# Patient Record
Sex: Male | Born: 1950 | Race: Black or African American | Hispanic: No | Smoking: Current every day smoker
Health system: Southern US, Community
[De-identification: ages and names within clinical notes are randomized; demographics above are authoritative.]

## PROBLEM LIST (undated history)

## (undated) DIAGNOSIS — A4902 Methicillin resistant Staphylococcus aureus infection, unspecified site: Secondary | ICD-10-CM

## (undated) DIAGNOSIS — N400 Enlarged prostate without lower urinary tract symptoms: Secondary | ICD-10-CM

## (undated) DIAGNOSIS — M6281 Muscle weakness (generalized): Secondary | ICD-10-CM

## (undated) DIAGNOSIS — N200 Calculus of kidney: Secondary | ICD-10-CM

## (undated) DIAGNOSIS — R41841 Cognitive communication deficit: Secondary | ICD-10-CM

## (undated) DIAGNOSIS — I10 Essential (primary) hypertension: Secondary | ICD-10-CM

## (undated) DIAGNOSIS — J9600 Acute respiratory failure, unspecified whether with hypoxia or hypercapnia: Secondary | ICD-10-CM

## (undated) DIAGNOSIS — K264 Chronic or unspecified duodenal ulcer with hemorrhage: Secondary | ICD-10-CM

## (undated) HISTORY — PX: TOTAL KNEE ARTHROPLASTY: SHX125

## (undated) HISTORY — PX: NECK SURGERY: SHX720

## (undated) HISTORY — PX: APPENDECTOMY: SHX54

## (undated) HISTORY — PX: WRIST SURGERY: SHX841

## (undated) HISTORY — PX: TIBIA FRACTURE SURGERY: SHX806

## (undated) HISTORY — PX: KNEE ARTHROSCOPY: SUR90

## (undated) HISTORY — DX: Essential (primary) hypertension: I10

---

## 2003-11-04 ENCOUNTER — Emergency Department (HOSPITAL_COMMUNITY): Admission: EM | Admit: 2003-11-04 | Discharge: 2003-11-04 | Payer: Self-pay | Admitting: Emergency Medicine

## 2006-07-16 ENCOUNTER — Emergency Department (HOSPITAL_COMMUNITY): Admission: EM | Admit: 2006-07-16 | Discharge: 2006-07-16 | Payer: Self-pay | Admitting: Emergency Medicine

## 2006-07-19 ENCOUNTER — Emergency Department (HOSPITAL_COMMUNITY): Admission: EM | Admit: 2006-07-19 | Discharge: 2006-07-19 | Payer: Self-pay | Admitting: Emergency Medicine

## 2006-08-29 ENCOUNTER — Emergency Department (HOSPITAL_COMMUNITY): Admission: EM | Admit: 2006-08-29 | Discharge: 2006-08-29 | Payer: Self-pay | Admitting: Emergency Medicine

## 2006-09-10 ENCOUNTER — Ambulatory Visit (HOSPITAL_COMMUNITY): Admission: RE | Admit: 2006-09-10 | Discharge: 2006-09-10 | Payer: Self-pay | Admitting: Orthopedic Surgery

## 2006-09-20 ENCOUNTER — Encounter: Payer: Self-pay | Admitting: Vascular Surgery

## 2006-09-20 ENCOUNTER — Ambulatory Visit: Payer: Self-pay | Admitting: Vascular Surgery

## 2006-09-20 ENCOUNTER — Ambulatory Visit (HOSPITAL_COMMUNITY): Admission: RE | Admit: 2006-09-20 | Discharge: 2006-09-20 | Payer: Self-pay | Admitting: Orthopedic Surgery

## 2006-10-27 ENCOUNTER — Encounter: Admission: RE | Admit: 2006-10-27 | Discharge: 2006-12-20 | Payer: Self-pay | Admitting: Orthopedic Surgery

## 2006-12-15 ENCOUNTER — Ambulatory Visit: Payer: Self-pay | Admitting: Internal Medicine

## 2006-12-15 LAB — CONVERTED CEMR LAB
ALT: 51 units/L — ABNORMAL HIGH (ref 0–40)
AST: 49 units/L — ABNORMAL HIGH (ref 0–37)
Albumin: 4.2 g/dL (ref 3.5–5.2)
Alkaline Phosphatase: 86 units/L (ref 39–117)
BUN: 11 mg/dL (ref 6–23)
Basophils Absolute: 0.1 10*3/uL (ref 0.0–0.1)
Basophils Relative: 2.4 % — ABNORMAL HIGH (ref 0.0–1.0)
Bilirubin, Direct: 0.1 mg/dL (ref 0.0–0.3)
CO2: 32 meq/L (ref 19–32)
Calcium: 9.3 mg/dL (ref 8.4–10.5)
Chloride: 111 meq/L (ref 96–112)
Creatinine, Ser: 1 mg/dL (ref 0.4–1.5)
Eosinophils Absolute: 0.1 10*3/uL (ref 0.0–0.6)
Eosinophils Relative: 1.7 % (ref 0.0–5.0)
GFR calc Af Amer: 101 mL/min
GFR calc non Af Amer: 83 mL/min
Glucose, Bld: 68 mg/dL — ABNORMAL LOW (ref 70–99)
HCT: 44.9 % (ref 39.0–52.0)
Hemoglobin: 15.6 g/dL (ref 13.0–17.0)
Lymphocytes Relative: 44.2 % (ref 12.0–46.0)
MCHC: 34.7 g/dL (ref 30.0–36.0)
MCV: 97.2 fL (ref 78.0–100.0)
Monocytes Absolute: 0.6 10*3/uL (ref 0.2–0.7)
Monocytes Relative: 11.1 % — ABNORMAL HIGH (ref 3.0–11.0)
Neutro Abs: 2.2 10*3/uL (ref 1.4–7.7)
Neutrophils Relative %: 40.6 % — ABNORMAL LOW (ref 43.0–77.0)
Platelets: 284 10*3/uL (ref 150–400)
Potassium: 4.3 meq/L (ref 3.5–5.1)
RBC: 4.61 M/uL (ref 4.22–5.81)
RDW: 12 % (ref 11.5–14.6)
Sodium: 143 meq/L (ref 135–145)
TSH: 1.81 microintl units/mL (ref 0.35–5.50)
Total Bilirubin: 0.8 mg/dL (ref 0.3–1.2)
Total Protein: 7.6 g/dL (ref 6.0–8.3)
WBC: 5.5 10*3/uL (ref 4.5–10.5)

## 2006-12-19 DIAGNOSIS — M1990 Unspecified osteoarthritis, unspecified site: Secondary | ICD-10-CM | POA: Insufficient documentation

## 2006-12-19 DIAGNOSIS — G35 Multiple sclerosis: Secondary | ICD-10-CM

## 2006-12-23 ENCOUNTER — Ambulatory Visit: Payer: Self-pay

## 2007-01-02 ENCOUNTER — Inpatient Hospital Stay (HOSPITAL_COMMUNITY): Admission: RE | Admit: 2007-01-02 | Discharge: 2007-01-06 | Payer: Self-pay | Admitting: Orthopedic Surgery

## 2007-01-05 ENCOUNTER — Ambulatory Visit: Payer: Self-pay | Admitting: Physical Medicine & Rehabilitation

## 2007-01-24 ENCOUNTER — Emergency Department (HOSPITAL_COMMUNITY): Admission: EM | Admit: 2007-01-24 | Discharge: 2007-01-24 | Payer: Self-pay | Admitting: Emergency Medicine

## 2007-01-25 ENCOUNTER — Encounter: Admission: RE | Admit: 2007-01-25 | Discharge: 2007-04-25 | Payer: Self-pay | Admitting: Orthopedic Surgery

## 2007-01-25 ENCOUNTER — Emergency Department (HOSPITAL_COMMUNITY): Admission: EM | Admit: 2007-01-25 | Discharge: 2007-01-25 | Payer: Self-pay | Admitting: Emergency Medicine

## 2007-06-26 ENCOUNTER — Encounter: Admission: RE | Admit: 2007-06-26 | Discharge: 2007-06-27 | Payer: Self-pay | Admitting: *Deleted

## 2007-07-05 ENCOUNTER — Encounter: Admission: RE | Admit: 2007-07-05 | Discharge: 2007-10-03 | Payer: Self-pay | Admitting: *Deleted

## 2007-09-22 ENCOUNTER — Ambulatory Visit (HOSPITAL_COMMUNITY): Admission: RE | Admit: 2007-09-22 | Discharge: 2007-09-22 | Payer: Self-pay | Admitting: *Deleted

## 2007-10-25 ENCOUNTER — Encounter: Admission: RE | Admit: 2007-10-25 | Discharge: 2008-01-02 | Payer: Self-pay | Admitting: *Deleted

## 2008-03-20 ENCOUNTER — Encounter: Admission: RE | Admit: 2008-03-20 | Discharge: 2008-06-06 | Payer: Self-pay | Admitting: Orthopedic Surgery

## 2008-04-29 ENCOUNTER — Ambulatory Visit (HOSPITAL_COMMUNITY): Admission: RE | Admit: 2008-04-29 | Discharge: 2008-04-29 | Payer: Self-pay | Admitting: Orthopedic Surgery

## 2008-06-14 ENCOUNTER — Encounter: Admission: RE | Admit: 2008-06-14 | Discharge: 2008-06-14 | Payer: Self-pay | Admitting: Neurology

## 2008-08-05 ENCOUNTER — Encounter: Admission: RE | Admit: 2008-08-05 | Discharge: 2008-10-07 | Payer: Self-pay | Admitting: Neurology

## 2009-02-06 ENCOUNTER — Encounter: Admission: RE | Admit: 2009-02-06 | Discharge: 2009-03-12 | Payer: Self-pay | Admitting: Neurology

## 2009-02-17 ENCOUNTER — Encounter: Admission: RE | Admit: 2009-02-17 | Discharge: 2009-02-17 | Payer: Self-pay | Admitting: Neurology

## 2009-04-11 ENCOUNTER — Inpatient Hospital Stay (HOSPITAL_COMMUNITY): Admission: RE | Admit: 2009-04-11 | Discharge: 2009-04-14 | Payer: Self-pay | Admitting: Neurosurgery

## 2009-07-02 ENCOUNTER — Encounter: Admission: RE | Admit: 2009-07-02 | Discharge: 2009-09-04 | Payer: Self-pay | Admitting: Neurosurgery

## 2009-08-28 ENCOUNTER — Emergency Department (HOSPITAL_COMMUNITY): Admission: EM | Admit: 2009-08-28 | Discharge: 2009-08-28 | Payer: Self-pay | Admitting: Emergency Medicine

## 2009-09-05 ENCOUNTER — Encounter: Payer: Self-pay | Admitting: Orthopaedic Surgery

## 2009-09-05 ENCOUNTER — Inpatient Hospital Stay (HOSPITAL_COMMUNITY): Admission: EM | Admit: 2009-09-05 | Discharge: 2009-09-09 | Payer: Self-pay | Admitting: Emergency Medicine

## 2009-10-20 ENCOUNTER — Encounter: Admission: RE | Admit: 2009-10-20 | Discharge: 2009-12-15 | Payer: Self-pay | Admitting: Orthopaedic Surgery

## 2009-11-29 ENCOUNTER — Emergency Department (HOSPITAL_COMMUNITY): Admission: EM | Admit: 2009-11-29 | Discharge: 2009-11-29 | Payer: Self-pay | Admitting: Emergency Medicine

## 2009-12-08 ENCOUNTER — Emergency Department (HOSPITAL_COMMUNITY): Admission: EM | Admit: 2009-12-08 | Discharge: 2009-12-08 | Payer: Self-pay | Admitting: Emergency Medicine

## 2009-12-16 ENCOUNTER — Inpatient Hospital Stay (HOSPITAL_COMMUNITY): Admission: EM | Admit: 2009-12-16 | Discharge: 2009-12-19 | Payer: Self-pay | Admitting: Emergency Medicine

## 2009-12-28 ENCOUNTER — Emergency Department (HOSPITAL_COMMUNITY): Admission: EM | Admit: 2009-12-28 | Discharge: 2009-12-28 | Payer: Self-pay | Admitting: Emergency Medicine

## 2010-04-11 ENCOUNTER — Emergency Department (HOSPITAL_COMMUNITY): Admission: EM | Admit: 2010-04-11 | Discharge: 2010-04-12 | Payer: Self-pay | Admitting: Emergency Medicine

## 2010-06-16 ENCOUNTER — Ambulatory Visit (HOSPITAL_COMMUNITY)
Admission: RE | Admit: 2010-06-16 | Discharge: 2010-06-17 | Disposition: A | Discharge status: Other Acute Inpatient Hospital | Payer: Self-pay | Source: Home / Self Care | Attending: General Surgery | Admitting: General Surgery

## 2010-06-16 ENCOUNTER — Encounter (INDEPENDENT_AMBULATORY_CARE_PROVIDER_SITE_OTHER): Payer: Self-pay | Admitting: General Surgery

## 2010-06-28 ENCOUNTER — Emergency Department (HOSPITAL_COMMUNITY)
Admission: EM | Admit: 2010-06-28 | Discharge: 2010-06-28 | Payer: Self-pay | Source: Home / Self Care | Admitting: Emergency Medicine

## 2010-07-08 ENCOUNTER — Ambulatory Visit (HOSPITAL_COMMUNITY)
Admission: RE | Admit: 2010-07-08 | Discharge: 2010-07-08 | Payer: Self-pay | Source: Home / Self Care | Attending: Specialist | Admitting: Specialist

## 2010-07-19 ENCOUNTER — Encounter: Payer: Self-pay | Admitting: Orthopedic Surgery

## 2010-07-19 ENCOUNTER — Encounter: Payer: Self-pay | Admitting: Neurology

## 2010-08-31 ENCOUNTER — Emergency Department (HOSPITAL_COMMUNITY)
Admission: EM | Admit: 2010-08-31 | Discharge: 2010-08-31 | Disposition: A | Discharge status: Home / Self Care | Payer: Medicaid Other | Attending: Emergency Medicine | Admitting: Emergency Medicine

## 2010-08-31 ENCOUNTER — Emergency Department (HOSPITAL_COMMUNITY): Payer: Medicaid Other

## 2010-08-31 DIAGNOSIS — I10 Essential (primary) hypertension: Secondary | ICD-10-CM | POA: Insufficient documentation

## 2010-08-31 DIAGNOSIS — S20219A Contusion of unspecified front wall of thorax, initial encounter: Secondary | ICD-10-CM | POA: Insufficient documentation

## 2010-08-31 DIAGNOSIS — W06XXXA Fall from bed, initial encounter: Secondary | ICD-10-CM | POA: Insufficient documentation

## 2010-09-07 LAB — COMPREHENSIVE METABOLIC PANEL
ALT: 86 U/L — ABNORMAL HIGH (ref 0–53)
Albumin: 3.8 g/dL (ref 3.5–5.2)
Calcium: 9.8 mg/dL (ref 8.4–10.5)
Chloride: 109 mEq/L (ref 96–112)
Glucose, Bld: 103 mg/dL — ABNORMAL HIGH (ref 70–99)

## 2010-09-07 LAB — SURGICAL PCR SCREEN: MRSA, PCR: POSITIVE — AB

## 2010-09-07 LAB — DIFFERENTIAL
Basophils Absolute: 0 10*3/uL (ref 0.0–0.1)
Basophils Relative: 1 % (ref 0–1)
Monocytes Relative: 9 % (ref 3–12)
Neutro Abs: 2.2 10*3/uL (ref 1.7–7.7)

## 2010-09-07 LAB — CBC
Hemoglobin: 14.8 g/dL (ref 13.0–17.0)
RBC: 4.35 MIL/uL (ref 4.22–5.81)
RDW: 12.2 % (ref 11.5–15.5)

## 2010-09-09 LAB — HEPATIC FUNCTION PANEL
ALT: 59 U/L — ABNORMAL HIGH (ref 0–53)
AST: 54 U/L — ABNORMAL HIGH (ref 0–37)
Albumin: 3.8 g/dL (ref 3.5–5.2)
Indirect Bilirubin: 0.5 mg/dL (ref 0.3–0.9)

## 2010-09-09 LAB — URINALYSIS, ROUTINE W REFLEX MICROSCOPIC
Hgb urine dipstick: NEGATIVE
Protein, ur: NEGATIVE mg/dL
Specific Gravity, Urine: 1.019 (ref 1.005–1.030)
Urobilinogen, UA: 0.2 mg/dL (ref 0.0–1.0)
pH: 7 (ref 5.0–8.0)

## 2010-09-09 LAB — BASIC METABOLIC PANEL
CO2: 25 mEq/L (ref 19–32)
Calcium: 9.1 mg/dL (ref 8.4–10.5)
Glucose, Bld: 100 mg/dL — ABNORMAL HIGH (ref 70–99)
Potassium: 5 mEq/L (ref 3.5–5.1)
Sodium: 132 mEq/L — ABNORMAL LOW (ref 135–145)

## 2010-09-09 LAB — CBC
HCT: 39.5 % (ref 39.0–52.0)
MCH: 33.8 pg (ref 26.0–34.0)
MCHC: 35.4 g/dL (ref 30.0–36.0)
RBC: 4.14 MIL/uL — ABNORMAL LOW (ref 4.22–5.81)
RDW: 12 % (ref 11.5–15.5)

## 2010-09-09 LAB — DIFFERENTIAL
Neutro Abs: 3.2 10*3/uL (ref 1.7–7.7)
Neutrophils Relative %: 54 % (ref 43–77)

## 2010-09-13 LAB — BASIC METABOLIC PANEL
BUN: 9 mg/dL (ref 6–23)
Calcium: 8.9 mg/dL (ref 8.4–10.5)
Chloride: 105 mEq/L (ref 96–112)
Creatinine, Ser: 0.94 mg/dL (ref 0.4–1.5)
GFR calc Af Amer: >60 mL/min (ref >60)
GFR calc non Af Amer: >60 mL/min (ref >60)

## 2010-09-13 LAB — CBC
Platelets: 200 10*3/uL (ref 150–400)
RBC: 4.51 MIL/uL (ref 4.22–5.81)
WBC: 6.1 10*3/uL (ref 4.0–10.5)

## 2010-09-14 LAB — RAPID URINE DRUG SCREEN, HOSP PERFORMED
Amphetamines: NOT DETECTED
Barbiturates: NOT DETECTED
Opiates: NOT DETECTED

## 2010-09-14 LAB — URINALYSIS, ROUTINE W REFLEX MICROSCOPIC
Glucose, UA: NEGATIVE mg/dL
Nitrite: NEGATIVE
Specific Gravity, Urine: 1.029 (ref 1.005–1.030)
pH: 5.5 (ref 5.0–8.0)

## 2010-09-14 LAB — COMPREHENSIVE METABOLIC PANEL
ALT: 32 U/L (ref 0–53)
Alkaline Phosphatase: 140 U/L — ABNORMAL HIGH (ref 39–117)
BUN: 12 mg/dL (ref 6–23)
CO2: 26 mEq/L (ref 19–32)
Calcium: 9.4 mg/dL (ref 8.4–10.5)
GFR calc non Af Amer: >60 mL/min (ref >60)
Glucose, Bld: 123 mg/dL — ABNORMAL HIGH (ref 70–99)
Potassium: 3.6 mEq/L (ref 3.5–5.1)
Sodium: 141 mEq/L (ref 135–145)
Total Protein: 7.7 g/dL (ref 6.0–8.3)

## 2010-09-14 LAB — URINE CULTURE: Colony Count: 3000

## 2010-09-14 LAB — DIFFERENTIAL
Basophils Absolute: 0 10*3/uL (ref 0.0–0.1)
Basophils Relative: 1 % (ref 0–1)
Eosinophils Absolute: 0.1 10*3/uL (ref 0.0–0.7)
Neutro Abs: 3.1 10*3/uL (ref 1.7–7.7)
Neutrophils Relative %: 56 % (ref 43–77)

## 2010-09-14 LAB — URINE MICROSCOPIC-ADD ON

## 2010-09-14 LAB — CBC
HCT: 47.6 % (ref 39.0–52.0)
Hemoglobin: 16.4 g/dL (ref 13.0–17.0)
MCHC: 34.5 g/dL (ref 30.0–36.0)
RBC: 4.85 MIL/uL (ref 4.22–5.81)
RDW: 12.5 % (ref 11.5–15.5)

## 2010-09-20 LAB — COMPREHENSIVE METABOLIC PANEL WITH GFR
ALT: 40 U/L (ref 0–53)
AST: 50 U/L — ABNORMAL HIGH (ref 0–37)
Alkaline Phosphatase: 91 U/L (ref 39–117)
CO2: 28 meq/L (ref 19–32)
GFR calc non Af Amer: >60 mL/min (ref >60)
Glucose, Bld: 102 mg/dL — ABNORMAL HIGH (ref 70–99)
Potassium: 4.2 meq/L (ref 3.5–5.1)
Sodium: 141 meq/L (ref 135–145)

## 2010-09-20 LAB — DIFFERENTIAL
Basophils Absolute: 0 10*3/uL (ref 0.0–0.1)
Basophils Relative: 0 % (ref 0–1)
Eosinophils Absolute: 0 K/uL (ref 0.0–0.7)
Eosinophils Relative: 1 % (ref 0–5)
Lymphocytes Relative: 17 % (ref 12–46)
Lymphs Abs: 1.1 10*3/uL (ref 0.7–4.0)
Monocytes Absolute: 0.5 10*3/uL (ref 0.1–1.0)
Monocytes Relative: 8 % (ref 3–12)
Neutro Abs: 4.9 10*3/uL (ref 1.7–7.7)
Neutrophils Relative %: 74 % (ref 43–77)

## 2010-09-20 LAB — BASIC METABOLIC PANEL
BUN: 14 mg/dL (ref 6–23)
CO2: 29 mEq/L (ref 19–32)
Chloride: 104 mEq/L (ref 96–112)
Potassium: 4.4 mEq/L (ref 3.5–5.1)

## 2010-09-20 LAB — COMPREHENSIVE METABOLIC PANEL
Albumin: 3.7 g/dL (ref 3.5–5.2)
BUN: 10 mg/dL (ref 6–23)
Calcium: 9.1 mg/dL (ref 8.4–10.5)
Chloride: 104 mEq/L (ref 96–112)
Creatinine, Ser: 0.87 mg/dL (ref 0.4–1.5)
GFR calc Af Amer: >60 mL/min (ref >60)
Total Bilirubin: 0.8 mg/dL (ref 0.3–1.2)
Total Protein: 7.4 g/dL (ref 6.0–8.3)

## 2010-09-20 LAB — CBC
HCT: 42.7 % (ref 39.0–52.0)
HCT: 43 % (ref 39.0–52.0)
Hemoglobin: 15.1 g/dL (ref 13.0–17.0)
MCHC: 34.7 g/dL (ref 30.0–36.0)
MCHC: 35.3 g/dL (ref 30.0–36.0)
MCV: 100 fL (ref 78.0–100.0)
MCV: 98.5 fL (ref 78.0–100.0)
Platelets: 316 10*3/uL (ref 150–400)
Platelets: 334 10*3/uL (ref 150–400)
RBC: 4.3 MIL/uL (ref 4.22–5.81)
RBC: 4.34 MIL/uL (ref 4.22–5.81)
RDW: 12.4 % (ref 11.5–15.5)
WBC: 6.2 10*3/uL (ref 4.0–10.5)
WBC: 6.6 K/uL (ref 4.0–10.5)

## 2010-10-01 LAB — BASIC METABOLIC PANEL
BUN: 9 mg/dL (ref 6–23)
CO2: 27 mEq/L (ref 19–32)
Chloride: 106 mEq/L (ref 96–112)
Creatinine, Ser: 0.99 mg/dL (ref 0.4–1.5)

## 2010-10-01 LAB — CBC
MCHC: 35.5 g/dL (ref 30.0–36.0)
MCV: 97.3 fL (ref 78.0–100.0)
Platelets: 242 10*3/uL (ref 150–400)
RDW: 12.2 % (ref 11.5–15.5)

## 2010-11-10 NOTE — Op Note (Signed)
NAMEJEDIAH, [name]               ACCOUNT NO.:  [id number]   MEDICAL RECORD NO.:  [id number]          PATIENT TYPE:  INP   LOCATION:  5040                         FACILITY:  [facility]   PHYSICIAN:  Feliberto Gottron. Turner Daniels, M.D.   DATE OF BIRTH:  1951/04/21   DATE OF PROCEDURE:  01/02/2007  DATE OF DISCHARGE:  01/06/2007                               OPERATIVE REPORT   PREOPERATIVE DIAGNOSIS:  Right knee end-stage arthritis.   POSTOPERATIVE DIAGNOSIS:  Right knee end-stage arthritis.   PROCEDURE:  Right total knee arthroplasty using DePuy Sigma RP  components, all cemented #4 tibia, #5 right femur, 41 mm patellar button  and a 10-mm Sigma RP spacer.   FIRST ASSISTANT:  Kevin Mayer PA-C   SECOND ASSISTANT:  [name], Medical Student 4.   ESTIMATED BLOOD LOSS:  Minimal.   TOURNIQUET TIME:  1 hour 25 minutes.   FLUID REPLACEMENT:  1200 mL crystalloid.   URINE OUTPUT:  300 mL.   DRAINS:  Foley catheter and two medium Hemovacs.   INDICATIONS FOR PROCEDURE:  The patient is a 60 year old gentleman with  a history of multiple sclerosis and end-stage arthritis of the right  knee that has failed conservative treatment with exercise, anti-  inflammatory medicine, cortisone injections and now desires elective  right total knee arthroplasty to decrease pain and increase function.  Risks and benefits of surgery discussed, questions answered.   DESCRIPTION OF PROCEDURE:  The patient identified by armband, taken to  the operating room at [facility].  Appropriate site monitors  were attached and general endotracheal anesthesia induced with the  patient in supine position.  Tourniquet applied high to the right thigh  and right lower extremity prepped and draped in usual sterile fashion  from the ankle to the tourniquet.  Limb was wrapped with an Esmarch  bandage, tourniquet inflated to 300 mmHg and we began the procedure by  making anterior midline incision starting about 8 cm above the  patella  going 8 cm distal to the patella 2 cm distal to and 1 cm medial to the  tibial tubercle.  Small bleeders in the skin and subcutaneous tissue  identified and cauterized.  The transverse retinaculum was incised  reflected medially allowing medial parapatellar arthrotomy.  The patella  was everted, prepatellar fat pad resected, superficial medial collateral  ligament was elevated off the tibia going from anterior to posterior  leaving it intact distally and stripping it down the tibia about 4 cm.  With the patella everted, knee was then hyperflexed exposing peripheral  osteophytes which were removed with the osteotomes and rongeurs.  After  the notch osteophytes were resected, the ACL and PCL were also resected,  allowing [place] to a hyperflexed the knee placing a posterior medial Z  retractor, a McHale retractor through the notch and a lateral Homan  retractor.  The proximal tibia was then entered with the DePuy step  drill followed by intramedullary rod and a 0 degrees posterior slope  cutting guide, allowing resection of 5-6 mm of bone medially and about  10 mm of bone laterally with the  knee hyperflexed, we then entered the  distal femur with the step drill followed by the intramedullary cutting  guide set at 5 degrees of valgus.  This was pinned in place along the  epicondylar axis and the distal 11 mm of the patella was resected.  We  then sized the femur for a #5 right femoral component again and pinned  this in place along with 3 degrees of external rotation and performed  the anterior-posterior and chamfer cuts without difficulty, followed by  the femoral box cut.  The patella was then measured at 26 mm and because  of the size patella which was felt to be large, a 10 mm resection was  set and the posterior 10 mm of the patella resected, sized for 41 mm  button and drilled.  At this point the knee was once again hyperflexed  exposing the proximal tibia which was sized to a #4  tibial baseplate,  pinned, the smokestack was then applied and we reamed with the conical  reamer followed by the Delta fit keel punch and then hammered into place  a #5 right trial femoral component and placed a 10-mm Sigma RP trial  spacer.  The knee then came out to full extension, flexed easily to 135  degrees and the patella with a 41-mm trial tracked normally.  At this  point the trial components were removed and all bony surfaces were water  picked clean and dried with suction and sponges.  A double batch of  DePuy HV cement with 1500 mg Zinacef was mixed at the back table and  applied to the mating surfaces of the metallic components and the bone  components, except for the posterior condyles of the femur.  In order we  then hammered into place a #4 tibial baseplate, a five right femoral  component, a 41 mm patellar button and a 10 Sigma RP spacer.  Excess  cement was removed after each component was placed and the cement  allowed to cure.  Medium Hemovac drains placed deep in the wound which  was once again irrigated clean and then we closed the parapatellar  arthrotomy with running #1 Vicryl suture, the subcutaneous tissue with 0-  0 and 2-0 undyed Vicryl suture and the skin with skin staples.  A  dressing of Xeroform, 4x4 dressing sponges, Webril and Ace wrap applied.  Tourniquet was let down.  The patient was then awakened and taken to the  recovery room without difficulty.      Feliberto Gottron. Turner Daniels, M.D.  Electronically Signed     FJR/MEDQ  D:  01/11/2007  T:  01/12/2007  Job:  [id number]

## 2010-11-10 NOTE — Discharge Summary (Signed)
NAMEDAWSYN, RAMSARAN               ACCOUNT NO.:  [id number]   MEDICAL RECORD NO.:  [id number]          PATIENT TYPE:  INP   LOCATION:  5040                         FACILITY:  [facility]   PHYSICIAN:  Feliberto Gottron. Turner Daniels, M.D.   DATE OF BIRTH:  1950-11-05   DATE OF ADMISSION:  01/02/2007  DATE OF DISCHARGE:  01/06/2007                               DISCHARGE SUMMARY   DIAGNOSIS FOR THIS ADMISSION:  End-stage degenerative joint disease of  the right knee.   DISCHARGE SUMMARY:  The patient is a 60 year old gentleman with a  history of multiple sclerosis and end-stage arthritis of the right knee  that has failed conservative treatment with exercise, antiinflammatory  medication, cortisone injections, and now desires elective right total  knee arthroplasty to decrease pain and increase function.  Risks and  benefits of surgery were discussed, questions were answered, and he  wishes to proceed.   Patient has NO KNOWN DRUG ALLERGIES.   MEDICATIONS:  Vicodin as needed.   PAST MEDICAL HISTORY:  1. Usual childhood diseases.  2. Multiple sclerosis.  3. Hypertension.   SURGICAL HISTORY:  A right knee scope 2008.   SOCIAL HISTORY:  Positive for tobacco, ethanol.  No IV drug abuse.  He  is married and fully disabled.   FAMILY HISTORY:  His mother is in her 51s with a history of CVA.  Father  is in his 43s with a history of hypertension.   REVIEW OF SYSTEMS:  Positive for glasses and headache.  He denies any  shortness of breath, chest pain, or recent illness.   PHYSICAL EXAMINATION:  VITAL SIGNS:  Temperature 98.1, pulse 84, blood  pressure 136/56.  GENERAL:  Patient is a 6-foot, 387-pound male.  HEENT:  Head is normocephalic, atraumatic.  Pupils equal, round,  reactive to light and accommodation.  NECK:  No JVD, no bruits.  CHEST:  Positive breath sounds.  HEART:  Regular rate and rhythm.  ABDOMEN:  Positive bowel sounds.  No obvious spleen or liver  enlargement.  EXTREMITIES:  Right knee  range of motion 0 to 90 degrees with positive  crepitus and pain and positive effusion.  SKIN:  Intact, within normal  limits.  No obvious breaks in skin.   X-rays show bone-on-bone changes of the right knee, confirmed by  arthroscopy photographs.   PREOPERATIVE LABS:  Including CBC, CMET, chest x-ray, EKG, PT and PTT  were all within normal limits with exception of an AST of 40 and glucose  of 103.   HOSPITAL COURSE:  On the day of admission, the patient was taken to the  operating room at [facility] where he underwent a right total knee  arthroplasty using P Sigma RP components all cemented, #4 tibia, #5  right femur, 41-mm patella button.  A 10-mm Sigma RP spacer was used.  Medium Hemovac drain was placed double arm into the knee.  Foley  catheter was placed preoperatively.  Patient placed on preoperative  antibiotics.  Placed on postoperative Coumadin prophylaxis with Lovenox  therapy to bridge until therapeutic.  He was placed on the PCA Dilaudid  for  pain control, and he was put in with CPN in the TACU.   First postoperative day, patient was awake and alert, in moderate pain,  comfortable with PCA, taken p.o.'s well.  No nausea or vomiting.  Vital  signs were stable.  Drain output 575 mL at the time of surgery.  It was  discontinued the first postoperative day.  Hemoglobin 13.4 with a WBC of  7.7, INR 1.1.  Wound was clean and dry.  He was otherwise stable, and  physical therapy was begun in earnest.  Discharge planning was also  begun.   Postoperative day 2, the patient was without complaint, afebrile.  Hemoglobin 11.4, INR 1.4.  Dressing was dry.  Calf was soft and  nontender.  Foley was discontinued.  He continued to make slow but  steady progress in physical therapy.   Postoperative day 3, the patient was without complaint.  He is afebrile.  Hemoglobin 11.1, WBC of 8.4, INR 1.6.  Dressing was dry.  He was still  making slow progress with physical therapy because of  his balance  problems secondary to his multiple sclerosis but continued to progress  towards discharge planning.   Postoperative day 4, the patient was without complaint.  PT goals had  been met for safe transfer and ambulation.  He was afebrile.  Vital  signs were stable.  INR 1.9.  Dressing was dry.  Wound was benign.  He  was thought to be medically stable and orthopedically improved and  discharged home in the care of his family.  He will have home health PT,  OT, and RN.  Durable medical equipment as needed for advanced home care.  They will follow him for Coumadin prophylaxis for 2 weeks  postoperatively; with an INR of 1.5 to 2 is the goal.  He is given  prescriptions for Tylox and Robaxin.  He may use either Tylox or his  home Vicodin but not both.   DIET:  Regular.   ACTIVITY:  Weightbearing as tolerated with a walker.  He should have at  least some assist present on a constant basis because of his balance  problems.   He will return to the clinic in 1 week with Dr. Turner Daniels, sooner should he  have any difficulties such as increased temperature, drainage from the  wound, or pain not well controlled.   DIAGNOSIS FOR ADMISSION:  End-stage degenerative joint disease of the  right knee.   PROCEDURE:  Right total knee arthroplasty.      Laural Benes. Jannet Mantis.      Feliberto Gottron. Turner Daniels, M.D.  Electronically Signed    JBR/MEDQ  D:  02/07/2007  T:  02/07/2007  Job:  [id number]

## 2010-12-19 ENCOUNTER — Emergency Department (HOSPITAL_COMMUNITY): Payer: Medicaid Other

## 2010-12-19 ENCOUNTER — Emergency Department (HOSPITAL_COMMUNITY)
Admission: EM | Admit: 2010-12-19 | Discharge: 2010-12-19 | Disposition: A | Discharge status: Home / Self Care | Payer: Medicaid Other | Attending: Emergency Medicine | Admitting: Emergency Medicine

## 2010-12-19 DIAGNOSIS — Z4789 Encounter for other orthopedic aftercare: Secondary | ICD-10-CM | POA: Insufficient documentation

## 2010-12-19 DIAGNOSIS — K5900 Constipation, unspecified: Secondary | ICD-10-CM | POA: Insufficient documentation

## 2010-12-19 DIAGNOSIS — R29818 Other symptoms and signs involving the nervous system: Secondary | ICD-10-CM | POA: Insufficient documentation

## 2010-12-19 DIAGNOSIS — Y9210 Unspecified residential institution as the place of occurrence of the external cause: Secondary | ICD-10-CM | POA: Insufficient documentation

## 2010-12-19 DIAGNOSIS — R627 Adult failure to thrive: Secondary | ICD-10-CM | POA: Insufficient documentation

## 2010-12-19 DIAGNOSIS — I10 Essential (primary) hypertension: Secondary | ICD-10-CM | POA: Insufficient documentation

## 2010-12-19 DIAGNOSIS — W06XXXA Fall from bed, initial encounter: Secondary | ICD-10-CM | POA: Insufficient documentation

## 2010-12-19 DIAGNOSIS — M25519 Pain in unspecified shoulder: Secondary | ICD-10-CM | POA: Insufficient documentation

## 2011-02-05 ENCOUNTER — Emergency Department (HOSPITAL_COMMUNITY)
Admission: EM | Admit: 2011-02-05 | Discharge: 2011-02-05 | Disposition: A | Discharge status: Home / Self Care | Payer: Medicaid Other | Attending: Emergency Medicine | Admitting: Emergency Medicine

## 2011-02-05 ENCOUNTER — Emergency Department (HOSPITAL_COMMUNITY): Payer: Medicaid Other

## 2011-02-05 DIAGNOSIS — IMO0002 Reserved for concepts with insufficient information to code with codable children: Secondary | ICD-10-CM | POA: Insufficient documentation

## 2011-02-05 DIAGNOSIS — I10 Essential (primary) hypertension: Secondary | ICD-10-CM | POA: Insufficient documentation

## 2011-02-08 LAB — CULTURE, ROUTINE-ABSCESS

## 2011-02-19 ENCOUNTER — Inpatient Hospital Stay (HOSPITAL_COMMUNITY): Payer: Medicaid Other

## 2011-02-19 ENCOUNTER — Inpatient Hospital Stay (HOSPITAL_COMMUNITY)
Admission: EM | Admit: 2011-02-19 | Discharge: 2011-03-05 | DRG: 326 | Disposition: A | Discharge status: Skilled Nursing Facility | Payer: Medicaid Other | Source: Ambulatory Visit | Attending: Internal Medicine | Admitting: Internal Medicine

## 2011-02-19 DIAGNOSIS — Z96659 Presence of unspecified artificial knee joint: Secondary | ICD-10-CM

## 2011-02-19 DIAGNOSIS — K264 Chronic or unspecified duodenal ulcer with hemorrhage: Principal | ICD-10-CM | POA: Diagnosis present

## 2011-02-19 DIAGNOSIS — D696 Thrombocytopenia, unspecified: Secondary | ICD-10-CM | POA: Diagnosis present

## 2011-02-19 DIAGNOSIS — J9600 Acute respiratory failure, unspecified whether with hypoxia or hypercapnia: Secondary | ICD-10-CM | POA: Diagnosis not present

## 2011-02-19 DIAGNOSIS — R5381 Other malaise: Secondary | ICD-10-CM | POA: Diagnosis present

## 2011-02-19 DIAGNOSIS — D62 Acute posthemorrhagic anemia: Secondary | ICD-10-CM | POA: Diagnosis present

## 2011-02-19 DIAGNOSIS — J95851 Ventilator associated pneumonia: Secondary | ICD-10-CM | POA: Diagnosis not present

## 2011-02-19 DIAGNOSIS — N400 Enlarged prostate without lower urinary tract symptoms: Secondary | ICD-10-CM | POA: Diagnosis present

## 2011-02-19 DIAGNOSIS — A4902 Methicillin resistant Staphylococcus aureus infection, unspecified site: Secondary | ICD-10-CM | POA: Diagnosis present

## 2011-02-19 DIAGNOSIS — R55 Syncope and collapse: Secondary | ICD-10-CM | POA: Diagnosis present

## 2011-02-19 DIAGNOSIS — M129 Arthropathy, unspecified: Secondary | ICD-10-CM | POA: Diagnosis present

## 2011-02-19 DIAGNOSIS — K5900 Constipation, unspecified: Secondary | ICD-10-CM | POA: Diagnosis present

## 2011-02-19 DIAGNOSIS — E876 Hypokalemia: Secondary | ICD-10-CM | POA: Diagnosis not present

## 2011-02-19 DIAGNOSIS — G35 Multiple sclerosis: Secondary | ICD-10-CM | POA: Diagnosis present

## 2011-02-19 DIAGNOSIS — IMO0002 Reserved for concepts with insufficient information to code with codable children: Secondary | ICD-10-CM | POA: Diagnosis present

## 2011-02-19 DIAGNOSIS — I10 Essential (primary) hypertension: Secondary | ICD-10-CM | POA: Diagnosis present

## 2011-02-19 DIAGNOSIS — E46 Unspecified protein-calorie malnutrition: Secondary | ICD-10-CM | POA: Diagnosis not present

## 2011-02-19 DIAGNOSIS — F17200 Nicotine dependence, unspecified, uncomplicated: Secondary | ICD-10-CM | POA: Diagnosis present

## 2011-02-19 DIAGNOSIS — R578 Other shock: Secondary | ICD-10-CM | POA: Diagnosis not present

## 2011-02-19 DIAGNOSIS — A048 Other specified bacterial intestinal infections: Secondary | ICD-10-CM | POA: Diagnosis present

## 2011-02-19 DIAGNOSIS — Y849 Medical procedure, unspecified as the cause of abnormal reaction of the patient, or of later complication, without mention of misadventure at the time of the procedure: Secondary | ICD-10-CM | POA: Diagnosis not present

## 2011-02-19 DIAGNOSIS — K260 Acute duodenal ulcer with hemorrhage: Secondary | ICD-10-CM

## 2011-02-19 DIAGNOSIS — Z9911 Dependence on respirator [ventilator] status: Secondary | ICD-10-CM

## 2011-02-19 LAB — POCT I-STAT 3, ART BLOOD GAS (G3+)
Acid-base deficit: 5 mmol/L — ABNORMAL HIGH (ref 0.0–2.0)
O2 Saturation: 100 %
O2 Saturation: 99 %
Patient temperature: 97.7
TCO2: 21 mmol/L (ref 0–100)
TCO2: 23 mmol/L (ref 0–100)
pCO2 arterial: 42.7 mmHg (ref 35.0–45.0)
pH, Arterial: 7.291 — ABNORMAL LOW (ref 7.350–7.450)

## 2011-02-19 LAB — POCT I-STAT EG7
Acid-base deficit: 10 mmol/L — ABNORMAL HIGH (ref 0.0–2.0)
Bicarbonate: 17 mEq/L — ABNORMAL LOW (ref 20.0–24.0)
Calcium, Ion: 1.08 mmol/L — ABNORMAL LOW (ref 1.12–1.32)
HCT: 13 % — ABNORMAL LOW (ref 39.0–52.0)
O2 Saturation: 13 %
Patient temperature: 98.8
pCO2, Ven: 44.1 mmHg — ABNORMAL LOW (ref 45.0–50.0)
pO2, Ven: 14 mmHg — CL (ref 30.0–45.0)

## 2011-02-19 LAB — GLUCOSE, CAPILLARY: Glucose-Capillary: 118 mg/dL — ABNORMAL HIGH (ref 70–99)

## 2011-02-19 LAB — CBC
HCT: 23.2 % — ABNORMAL LOW (ref 39.0–52.0)
HCT: 17.3 % — ABNORMAL LOW (ref 39.0–52.0)
HCT: 18.8 % — ABNORMAL LOW (ref 39.0–52.0)
Hemoglobin: 8 g/dL — ABNORMAL LOW (ref 13.0–17.0)
MCH: 31.6 pg (ref 26.0–34.0)
MCH: 29.7 pg (ref 26.0–34.0)
MCHC: 35.2 g/dL (ref 30.0–36.0)
MCV: 89.6 fL (ref 78.0–100.0)
MCV: 85.5 fL (ref 78.0–100.0)
MCV: 84.3 fL (ref 78.0–100.0)
Platelets: 16 10*3/uL — CL (ref 150–400)
RBC: 2.2 MIL/uL — ABNORMAL LOW (ref 4.22–5.81)
RBC: 4.21 MIL/uL — ABNORMAL LOW (ref 4.22–5.81)
RDW: 12.8 % (ref 11.5–15.5)
RDW: 14.9 % (ref 11.5–15.5)
RDW: 14.7 % (ref 11.5–15.5)
WBC: 6.8 10*3/uL (ref 4.0–10.5)
WBC: 11.4 10*3/uL — ABNORMAL HIGH (ref 4.0–10.5)
WBC: 22.1 10*3/uL — ABNORMAL HIGH (ref 4.0–10.5)

## 2011-02-19 LAB — TROPONIN I: Troponin I: <0.3 ng/mL (ref <0.30)

## 2011-02-19 LAB — URINALYSIS, ROUTINE W REFLEX MICROSCOPIC
Bilirubin Urine: NEGATIVE
Ketones, ur: NEGATIVE mg/dL
Nitrite: NEGATIVE
Specific Gravity, Urine: 1.016 (ref 1.005–1.030)
Urobilinogen, UA: 0.2 mg/dL (ref 0.0–1.0)

## 2011-02-19 LAB — PROTIME-INR
INR: 1.3 (ref 0.00–1.49)
Prothrombin Time: 22.4 seconds — ABNORMAL HIGH (ref 11.6–15.2)

## 2011-02-19 LAB — POCT I-STAT 7, (LYTES, BLD GAS, ICA,H+H)
Bicarbonate: 16.4 mEq/L — ABNORMAL LOW (ref 20.0–24.0)
Calcium, Ion: 0.7 mmol/L — CL (ref 1.12–1.32)
HCT: 19 % — ABNORMAL LOW (ref 39.0–52.0)
HCT: 23 % — ABNORMAL LOW (ref 39.0–52.0)
Hemoglobin: 6.5 g/dL — CL (ref 13.0–17.0)
Hemoglobin: 7.8 g/dL — ABNORMAL LOW (ref 13.0–17.0)
Potassium: 5.5 mEq/L — ABNORMAL HIGH (ref 3.5–5.1)
Sodium: 144 mEq/L (ref 135–145)
TCO2: 18 mmol/L (ref 0–100)
pCO2 arterial: 37.5 mmHg (ref 35.0–45.0)
pH, Arterial: 7.181 — CL (ref 7.350–7.450)
pH, Arterial: 7.372 (ref 7.350–7.450)
pO2, Arterial: 49 mmHg — ABNORMAL LOW (ref 80.0–100.0)

## 2011-02-19 LAB — CORTISOL: Cortisol, Plasma: 22.7 ug/dL

## 2011-02-19 LAB — BASIC METABOLIC PANEL
CO2: 23 mEq/L (ref 19–32)
Calcium: 6.6 mg/dL — ABNORMAL LOW (ref 8.4–10.5)
Creatinine, Ser: 0.56 mg/dL (ref 0.50–1.35)
GFR calc non Af Amer: >60 mL/min (ref >60)

## 2011-02-19 LAB — DIFFERENTIAL
Basophils Absolute: 0 10*3/uL (ref 0.0–0.1)
Eosinophils Relative: 0 % (ref 0–5)
Lymphocytes Relative: 30 % (ref 12–46)
Neutro Abs: 4.2 10*3/uL (ref 1.7–7.7)
Neutrophils Relative %: 62 % (ref 43–77)

## 2011-02-19 LAB — CK TOTAL AND CKMB (NOT AT ARMC): Total CK: 172 U/L (ref 7–232)

## 2011-02-19 LAB — SAMPLE TO BLOOD BANK

## 2011-02-19 LAB — CARDIAC PANEL(CRET KIN+CKTOT+MB+TROPI)
Relative Index: INVALID (ref 0.0–2.5)
Total CK: 88 U/L (ref 7–232)
Troponin I: <0.3 ng/mL (ref <0.30)

## 2011-02-19 LAB — COMPREHENSIVE METABOLIC PANEL
ALT: 20 U/L (ref 0–53)
AST: 22 U/L (ref 0–37)
Albumin: 2.4 g/dL — ABNORMAL LOW (ref 3.5–5.2)
Alkaline Phosphatase: 65 U/L (ref 39–117)
BUN: 44 mg/dL — ABNORMAL HIGH (ref 6–23)
Chloride: 110 mEq/L (ref 96–112)
Potassium: 4.3 mEq/L (ref 3.5–5.1)
Total Bilirubin: 0.3 mg/dL (ref 0.3–1.2)

## 2011-02-19 LAB — MRSA PCR SCREENING: MRSA by PCR: NEGATIVE

## 2011-02-19 LAB — PREPARE RBC (CROSSMATCH)

## 2011-02-20 ENCOUNTER — Inpatient Hospital Stay (HOSPITAL_COMMUNITY): Payer: Medicaid Other

## 2011-02-20 DIAGNOSIS — K922 Gastrointestinal hemorrhage, unspecified: Secondary | ICD-10-CM

## 2011-02-20 DIAGNOSIS — R578 Other shock: Secondary | ICD-10-CM

## 2011-02-20 DIAGNOSIS — J9600 Acute respiratory failure, unspecified whether with hypoxia or hypercapnia: Secondary | ICD-10-CM

## 2011-02-20 LAB — DIFFERENTIAL
Eosinophils Absolute: 0 10*3/uL (ref 0.0–0.7)
Eosinophils Absolute: 0 10*3/uL (ref 0.0–0.7)
Eosinophils Relative: 0 % (ref 0–5)
Lymphs Abs: 1.5 10*3/uL (ref 0.7–4.0)
Lymphs Abs: 1.3 10*3/uL (ref 0.7–4.0)
Monocytes Absolute: 0.5 10*3/uL (ref 0.1–1.0)
Monocytes Relative: 5 % (ref 3–12)
Neutrophils Relative %: 80 % — ABNORMAL HIGH (ref 43–77)

## 2011-02-20 LAB — CBC
HCT: 28.9 % — ABNORMAL LOW (ref 39.0–52.0)
Hemoglobin: 9.9 g/dL — ABNORMAL LOW (ref 13.0–17.0)
MCH: 29.6 pg (ref 26.0–34.0)
MCHC: 35.1 g/dL (ref 30.0–36.0)
MCHC: 34.3 g/dL (ref 30.0–36.0)
MCV: 84.3 fL (ref 78.0–100.0)
MCV: 84.4 fL (ref 78.0–100.0)
Platelets: 49 10*3/uL — ABNORMAL LOW (ref 150–400)
Platelets: 49 10*3/uL — ABNORMAL LOW (ref 150–400)
RBC: 3.46 MIL/uL — ABNORMAL LOW (ref 4.22–5.81)
RDW: 15.8 % — ABNORMAL HIGH (ref 11.5–15.5)
RDW: 16 % — ABNORMAL HIGH (ref 11.5–15.5)
WBC: 9 10*3/uL (ref 4.0–10.5)
WBC: 8.9 10*3/uL (ref 4.0–10.5)

## 2011-02-20 LAB — BASIC METABOLIC PANEL
Chloride: 112 mEq/L (ref 96–112)
Creatinine, Ser: 0.56 mg/dL (ref 0.50–1.35)
GFR calc Af Amer: >60 mL/min (ref >60)
GFR calc non Af Amer: >60 mL/min (ref >60)
Potassium: 3.5 mEq/L (ref 3.5–5.1)

## 2011-02-20 LAB — PREPARE PLATELET PHERESIS: Unit division: 0

## 2011-02-20 LAB — GLUCOSE, CAPILLARY
Glucose-Capillary: 115 mg/dL — ABNORMAL HIGH (ref 70–99)
Glucose-Capillary: 107 mg/dL — ABNORMAL HIGH (ref 70–99)
Glucose-Capillary: 93 mg/dL (ref 70–99)
Glucose-Capillary: 113 mg/dL — ABNORMAL HIGH (ref 70–99)
Glucose-Capillary: 108 mg/dL — ABNORMAL HIGH (ref 70–99)

## 2011-02-20 LAB — POCT I-STAT 3, ART BLOOD GAS (G3+)
Acid-base deficit: 1 mmol/L (ref 0.0–2.0)
Bicarbonate: 24.7 mEq/L — ABNORMAL HIGH (ref 20.0–24.0)
Patient temperature: 99.5
TCO2: 26 mmol/L (ref 0–100)

## 2011-02-20 LAB — COMPREHENSIVE METABOLIC PANEL
Alkaline Phosphatase: 42 U/L (ref 39–117)
BUN: 20 mg/dL (ref 6–23)
Calcium: 7.4 mg/dL — ABNORMAL LOW (ref 8.4–10.5)
GFR calc Af Amer: >60 mL/min (ref >60)
Glucose, Bld: 124 mg/dL — ABNORMAL HIGH (ref 70–99)
Potassium: 3.5 mEq/L (ref 3.5–5.1)
Total Protein: 4.4 g/dL — ABNORMAL LOW (ref 6.0–8.3)

## 2011-02-20 LAB — URINE CULTURE

## 2011-02-21 ENCOUNTER — Inpatient Hospital Stay (HOSPITAL_COMMUNITY): Payer: Medicaid Other

## 2011-02-21 DIAGNOSIS — R578 Other shock: Secondary | ICD-10-CM

## 2011-02-21 DIAGNOSIS — S31109A Unspecified open wound of abdominal wall, unspecified quadrant without penetration into peritoneal cavity, initial encounter: Secondary | ICD-10-CM

## 2011-02-21 DIAGNOSIS — J9600 Acute respiratory failure, unspecified whether with hypoxia or hypercapnia: Secondary | ICD-10-CM

## 2011-02-21 DIAGNOSIS — K922 Gastrointestinal hemorrhage, unspecified: Secondary | ICD-10-CM

## 2011-02-21 LAB — HEPATIC FUNCTION PANEL
ALT: 13 U/L (ref 0–53)
AST: 19 U/L (ref 0–37)
Albumin: 1.9 g/dL — ABNORMAL LOW (ref 3.5–5.2)
Alkaline Phosphatase: 41 U/L (ref 39–117)
Bilirubin, Direct: 0.4 mg/dL — ABNORMAL HIGH (ref 0.0–0.3)
Total Bilirubin: 0.8 mg/dL (ref 0.3–1.2)

## 2011-02-21 LAB — CBC
HCT: 26.8 % — ABNORMAL LOW (ref 39.0–52.0)
Hemoglobin: 9 g/dL — ABNORMAL LOW (ref 13.0–17.0)
MCH: 29.1 pg (ref 26.0–34.0)
MCH: 29.2 pg (ref 26.0–34.0)
MCHC: 33.6 g/dL (ref 30.0–36.0)
MCV: 86.6 fL (ref 78.0–100.0)
Platelets: 101 10*3/uL — ABNORMAL LOW (ref 150–400)
Platelets: 122 10*3/uL — ABNORMAL LOW (ref 150–400)
Platelets: 59 10*3/uL — ABNORMAL LOW (ref 150–400)
RBC: 3.09 MIL/uL — ABNORMAL LOW (ref 4.22–5.81)
RBC: 3.09 MIL/uL — ABNORMAL LOW (ref 4.22–5.81)
RBC: 3.14 MIL/uL — ABNORMAL LOW (ref 4.22–5.81)
RBC: 3.7 MIL/uL — ABNORMAL LOW (ref 4.22–5.81)
RDW: 16.1 % — ABNORMAL HIGH (ref 11.5–15.5)
RDW: 16.2 % — ABNORMAL HIGH (ref 11.5–15.5)
WBC: 8.9 10*3/uL (ref 4.0–10.5)
WBC: 9.9 10*3/uL (ref 4.0–10.5)

## 2011-02-21 LAB — PREPARE FRESH FROZEN PLASMA
Unit division: 0
Unit division: 0
Unit division: 0
Unit division: 0
Unit division: 0
Unit division: 0

## 2011-02-21 LAB — DIFFERENTIAL
Basophils Absolute: 0 10*3/uL (ref 0.0–0.1)
Basophils Absolute: 0 10*3/uL (ref 0.0–0.1)
Basophils Relative: 0 % (ref 0–1)
Basophils Relative: 0 % (ref 0–1)
Eosinophils Absolute: 0 10*3/uL (ref 0.0–0.7)
Eosinophils Absolute: 0 10*3/uL (ref 0.0–0.7)
Eosinophils Relative: 0 % (ref 0–5)
Lymphs Abs: 1.3 10*3/uL (ref 0.7–4.0)
Monocytes Relative: 7 % (ref 3–12)
Neutrophils Relative %: 78 % — ABNORMAL HIGH (ref 43–77)
Neutrophils Relative %: 86 % — ABNORMAL HIGH (ref 43–77)

## 2011-02-21 LAB — BASIC METABOLIC PANEL
BUN: 12 mg/dL (ref 6–23)
BUN: 8 mg/dL (ref 6–23)
Calcium: 7.8 mg/dL — ABNORMAL LOW (ref 8.4–10.5)
Calcium: 8.1 mg/dL — ABNORMAL LOW (ref 8.4–10.5)
Chloride: 105 mEq/L (ref 96–112)
Creatinine, Ser: 0.49 mg/dL — ABNORMAL LOW (ref 0.50–1.35)
Creatinine, Ser: 0.52 mg/dL (ref 0.50–1.35)
GFR calc Af Amer: >60 mL/min (ref >60)
GFR calc Af Amer: >60 mL/min (ref >60)
GFR calc non Af Amer: >60 mL/min (ref >60)
GFR calc non Af Amer: >60 mL/min (ref >60)
Glucose, Bld: 117 mg/dL — ABNORMAL HIGH (ref 70–99)
Glucose, Bld: 128 mg/dL — ABNORMAL HIGH (ref 70–99)
Potassium: 3.3 mEq/L — ABNORMAL LOW (ref 3.5–5.1)
Sodium: 138 mEq/L (ref 135–145)
Sodium: 138 mEq/L (ref 135–145)

## 2011-02-21 LAB — POCT I-STAT 3, ART BLOOD GAS (G3+)
Acid-Base Excess: 2 mmol/L (ref 0.0–2.0)
Bicarbonate: 27.5 mEq/L — ABNORMAL HIGH (ref 20.0–24.0)

## 2011-02-21 LAB — PROTIME-INR
INR: 1.05 (ref 0.00–1.49)
Prothrombin Time: 13.9 seconds (ref 11.6–15.2)
Prothrombin Time: 13.9 seconds (ref 11.6–15.2)

## 2011-02-21 LAB — GLUCOSE, CAPILLARY
Glucose-Capillary: 105 mg/dL — ABNORMAL HIGH (ref 70–99)
Glucose-Capillary: 117 mg/dL — ABNORMAL HIGH (ref 70–99)
Glucose-Capillary: 122 mg/dL — ABNORMAL HIGH (ref 70–99)
Glucose-Capillary: 85 mg/dL (ref 70–99)
Glucose-Capillary: 97 mg/dL (ref 70–99)

## 2011-02-21 LAB — PHOSPHORUS: Phosphorus: 2.2 mg/dL — ABNORMAL LOW (ref 2.3–4.6)

## 2011-02-21 LAB — CALCIUM, IONIZED: Calcium, Ion: 0.83 mmol/L — ABNORMAL LOW (ref 1.12–1.32)

## 2011-02-21 LAB — URINE MICROSCOPIC-ADD ON

## 2011-02-21 LAB — URINALYSIS, ROUTINE W REFLEX MICROSCOPIC
Glucose, UA: NEGATIVE mg/dL
Specific Gravity, Urine: 1.009 (ref 1.005–1.030)

## 2011-02-22 ENCOUNTER — Inpatient Hospital Stay (HOSPITAL_COMMUNITY): Payer: Medicaid Other

## 2011-02-22 DIAGNOSIS — J189 Pneumonia, unspecified organism: Secondary | ICD-10-CM

## 2011-02-22 DIAGNOSIS — Z9911 Dependence on respirator [ventilator] status: Secondary | ICD-10-CM

## 2011-02-22 DIAGNOSIS — J9600 Acute respiratory failure, unspecified whether with hypoxia or hypercapnia: Secondary | ICD-10-CM

## 2011-02-22 DIAGNOSIS — K922 Gastrointestinal hemorrhage, unspecified: Secondary | ICD-10-CM

## 2011-02-22 LAB — BASIC METABOLIC PANEL
BUN: 6 mg/dL (ref 6–23)
BUN: 9 mg/dL (ref 6–23)
CO2: 26 mEq/L (ref 19–32)
Calcium: 7.6 mg/dL — ABNORMAL LOW (ref 8.4–10.5)
Chloride: 109 mEq/L (ref 96–112)
Creatinine, Ser: 0.6 mg/dL (ref 0.50–1.35)
GFR calc Af Amer: >60 mL/min (ref >60)
GFR calc non Af Amer: >60 mL/min (ref >60)
Glucose, Bld: 93 mg/dL (ref 70–99)
Potassium: 3.1 mEq/L — ABNORMAL LOW (ref 3.5–5.1)

## 2011-02-22 LAB — CROSSMATCH
ABO/RH(D): O POS
Unit division: 0
Unit division: 0
Unit division: 0
Unit division: 0
Unit division: 0
Unit division: 0
Unit division: 0
Unit division: 0
Unit division: 0
Unit division: 0
Unit division: 0
Unit division: 0
Unit division: 0
Unit division: 0
Unit division: 0

## 2011-02-22 LAB — POCT I-STAT 3, ART BLOOD GAS (G3+)
O2 Saturation: 88 %
Patient temperature: 99.4
Patient temperature: 103.9
TCO2: 29 mmol/L (ref 0–100)
TCO2: 27 mmol/L (ref 0–100)
pCO2 arterial: 47 mmHg — ABNORMAL HIGH (ref 35.0–45.0)
pH, Arterial: 7.383 (ref 7.350–7.450)

## 2011-02-22 LAB — GLUCOSE, CAPILLARY
Glucose-Capillary: 103 mg/dL — ABNORMAL HIGH (ref 70–99)
Glucose-Capillary: 97 mg/dL (ref 70–99)
Glucose-Capillary: 86 mg/dL (ref 70–99)

## 2011-02-22 LAB — URINE CULTURE

## 2011-02-22 LAB — CBC
MCH: 30 pg (ref 26.0–34.0)
MCV: 87.9 fL (ref 78.0–100.0)
Platelets: 158 10*3/uL (ref 150–400)
RBC: 3.73 MIL/uL — ABNORMAL LOW (ref 4.22–5.81)
RDW: 15.4 % (ref 11.5–15.5)

## 2011-02-22 LAB — PREPARE PLATELET PHERESIS

## 2011-02-22 LAB — PROCALCITONIN: Procalcitonin: 0.61 ng/mL

## 2011-02-23 ENCOUNTER — Inpatient Hospital Stay (HOSPITAL_COMMUNITY): Payer: Medicaid Other

## 2011-02-23 LAB — CBC
HCT: 32.3 % — ABNORMAL LOW (ref 39.0–52.0)
MCHC: 33.7 g/dL (ref 30.0–36.0)
MCV: 88.3 fL (ref 78.0–100.0)
MCV: 88.6 fL (ref 78.0–100.0)
Platelets: 192 10*3/uL (ref 150–400)
RDW: 15 % (ref 11.5–15.5)
RDW: 15 % (ref 11.5–15.5)
WBC: 7.7 10*3/uL (ref 4.0–10.5)

## 2011-02-23 LAB — GLUCOSE, CAPILLARY
Glucose-Capillary: 101 mg/dL — ABNORMAL HIGH (ref 70–99)
Glucose-Capillary: 110 mg/dL — ABNORMAL HIGH (ref 70–99)

## 2011-02-23 LAB — BASIC METABOLIC PANEL
CO2: 28 mEq/L (ref 19–32)
Calcium: 7.8 mg/dL — ABNORMAL LOW (ref 8.4–10.5)
Creatinine, Ser: 0.62 mg/dL (ref 0.50–1.35)
Glucose, Bld: 107 mg/dL — ABNORMAL HIGH (ref 70–99)

## 2011-02-23 LAB — POCT I-STAT 3, ART BLOOD GAS (G3+)
Bicarbonate: 24.8 mEq/L — ABNORMAL HIGH (ref 20.0–24.0)
TCO2: 26 mmol/L (ref 0–100)
pH, Arterial: 7.4 (ref 7.350–7.450)
pO2, Arterial: 71 mmHg — ABNORMAL LOW (ref 80.0–100.0)

## 2011-02-23 NOTE — Op Note (Signed)
  NAMEARISTIDIS, TALERICO NO.:  [id number]  MEDICAL RECORD NO.:  [id number]  LOCATION:  2116                         FACILITY:  [facility]  PHYSICIAN:  Ollen Gross. Vernell Morgans, M.D. DATE OF BIRTH:  11-06-1950  DATE OF PROCEDURE:  02/21/2011 DATE OF DISCHARGE:                              OPERATIVE REPORT   PREOPERATIVE DIAGNOSIS:  Open abdomen after oversewing of duodenal ulcer and pyloroplasty.  POSTOPERATIVE DIAGNOSIS:  Open abdomen after oversewing of duodenal ulcer and pyloroplasty.  PROCEDURE:  Closure of abdomen.  SURGEON:  Ollen Gross. Vernell Morgans, MD  ASSISTANT:  Angelia Mould. Derrell Lolling, MD  ANESTHESIA:  General endotracheal.  PROCEDURE IN DETAIL:  After informed consent was obtained, the patient was brought to the operating room, placed in supine position on the operating room table.  After adequate induction of general anesthesia, the patient's abdomen was prepped with Betadine.  Prior to prepping the abdomen with Betadine, the external sponge and plastic were removed. The internal sponge had been stapled to the skin.  This whole area was prepped with Betadine and draped in usual sterile manner.  We then removed the inner sponge and staples as well as the bowel bag.  The intestines actually looked good.  There was no longer that significant edema and swelling that we had seen initially and the intestines actually stayed in the abdomen once the bag was removed.  There was the omentum, that had become adherent over the pyloroplasty closure.  We were able to just bluntly with the tips of the fingers pull some of that omentum away enough to see the stitch line and it looked good.  There was no sign of leak.  We placed the omentum back over the closure.  The abdomen was otherwise clean.  At this point, we closed the anterior abdominal wall fascia with two running #1 double-stranded loop PDS sutures.  The subcutaneous tissue was left open except for the skin overlying the  knot, then packed with moistened Kerlix gauze.  Sterile dressings were applied.  The patient tolerated procedure well.  At the end of case, all needle, sponge, and instrument counts were correct. There was no increase in his peak airway pressures once the abdomen was closed.  He was then left intubated and taken back to the ICU for continued critical care management.     Ollen Gross. Vernell Morgans, M.D.     PST/MEDQ  D:  02/21/2011  T:  02/21/2011  Job:  [id number]  Electronically Signed by Chevis Pretty III M.D. on 02/23/2011 07:59:39 AM

## 2011-02-23 NOTE — Op Note (Signed)
NAMEVALERIAN, [name] NO.:  [id number]  MEDICAL RECORD NO.:  [id number]  LOCATION:  2116                         FACILITY:  [facility]  PHYSICIAN:  Ollen Gross. Vernell Morgans, M.D. DATE OF BIRTH:  March 18, 1951  DATE OF PROCEDURE:  02/19/2011 DATE OF DISCHARGE:                              OPERATIVE REPORT   PREOPERATIVE DIAGNOSIS:  Bleeding duodenal ulcer.  POSTOPERATIVE DIAGNOSIS:  Bleeding duodenal ulcer.  PROCEDURE:  Exploratory laparotomy, oversewing of duodenal ulcer and pyloroplasty.  SURGEON:  Ollen Gross. Vernell Morgans, MD  ASSISTANT:  Adolph Pollack, MD  ANESTHESIA:  General endotracheal.  PROCEDURE IN DETAIL:  After informed consent was obtained, the patient was brought to the operating room, placed in supine position on the operating table.  After induction of general anesthesia, the patient's abdomen was prepped with a ChloraPrep, allowed to dry and draped in usual sterile manner.  A midline incision was made with a 10 blade knife.  This incision was carried down through the skin and subcutaneous tissue sharply with the electrocautery.  The preperitoneal space until the linea alba was identified.  Linea alba was also incised with the electrocautery.  The preperitoneal space was probed bluntly with a hemostat until the peritoneum was opened, access was gained to the abdominal cavity.  The rest incision was opened under direct vision.  A Bookwalter retractor was deployed.  A large amount of just clear ascites fluid was evacuated.  The intestines had a lot of dark blood in them and the small bowel was eviscerated just so that we could see.  The stomach and duodenum were identified.  The stomach was distended with blood. The duodenum was mobilized using a Kocher maneuver.  Once the duodenum was able to be elevated somewhat, we then made a longitudinal opening through the distal stomach, pylorus and proximal duodenum.  This was done with the electrocautery.  A large  amount of blood was evacuated from within the intestines.  We then examined the duodenum and there was a large posterior ulcer with a visible vessel that was actively pumping away and bleeding.  This was controlled with several 2-0 silk stitches until the bleeding vessel was completely ligated.  We did note that after this portion of the procedure, we were still getting lots of bile reflux and back up to [place] through the duodenum.  The ulcer was raw and there was just some general raw surface oozing of blood, but this was not surgical bleeding.  At this point, the major significant bleeding was controlled.  We then closed the pyloroplasty and a transverse orientation with interrupted 2-0 silk full-thickness stitches and irrigated with copious amounts of saline.  Because of the amount of distention of the bowel, we were unable to close the abdomen at this point, so we placed a vacuum pack dressing over the open abdomen and once this was secured and there was a good seal, then we took the patient back to the ICU for further resuscitation measures and correction of his coagulopathy.     Ollen Gross. Vernell Morgans, M.D.     PST/MEDQ  D:  02/21/2011  T:  02/21/2011  Job:  [id number]  Electronically  Signed by Chevis Pretty III M.D. on 02/23/2011 07:59:34 AM

## 2011-02-24 ENCOUNTER — Inpatient Hospital Stay (HOSPITAL_COMMUNITY): Payer: Medicaid Other

## 2011-02-24 DIAGNOSIS — E46 Unspecified protein-calorie malnutrition: Secondary | ICD-10-CM

## 2011-02-24 DIAGNOSIS — Z9911 Dependence on respirator [ventilator] status: Secondary | ICD-10-CM

## 2011-02-24 DIAGNOSIS — J189 Pneumonia, unspecified organism: Secondary | ICD-10-CM

## 2011-02-24 DIAGNOSIS — K922 Gastrointestinal hemorrhage, unspecified: Secondary | ICD-10-CM

## 2011-02-24 DIAGNOSIS — J9600 Acute respiratory failure, unspecified whether with hypoxia or hypercapnia: Secondary | ICD-10-CM

## 2011-02-24 LAB — COMPREHENSIVE METABOLIC PANEL
ALT: 11 U/L (ref 0–53)
AST: 22 U/L (ref 0–37)
CO2: 21 mEq/L (ref 19–32)
Chloride: 119 mEq/L — ABNORMAL HIGH (ref 96–112)
GFR calc non Af Amer: >60 mL/min (ref >60)
Sodium: 143 mEq/L (ref 135–145)
Total Bilirubin: 0.3 mg/dL (ref 0.3–1.2)

## 2011-02-24 LAB — POCT I-STAT 3, ART BLOOD GAS (G3+)
Bicarbonate: 24.7 mEq/L — ABNORMAL HIGH (ref 20.0–24.0)
O2 Saturation: 87 %
pCO2 arterial: 41.1 mmHg (ref 35.0–45.0)
pO2, Arterial: 57 mmHg — ABNORMAL LOW (ref 80.0–100.0)

## 2011-02-24 LAB — CBC
HCT: 25.1 % — ABNORMAL LOW (ref 39.0–52.0)
HCT: 32.1 % — ABNORMAL LOW (ref 39.0–52.0)
Hemoglobin: 10.8 g/dL — ABNORMAL LOW (ref 13.0–17.0)
Platelets: 206 10*3/uL (ref 150–400)
RBC: 2.8 MIL/uL — ABNORMAL LOW (ref 4.22–5.81)
RBC: 3.66 MIL/uL — ABNORMAL LOW (ref 4.22–5.81)
RDW: 15.1 % (ref 11.5–15.5)
WBC: 8 10*3/uL (ref 4.0–10.5)
WBC: 9.7 10*3/uL (ref 4.0–10.5)

## 2011-02-24 LAB — IRON AND TIBC: UIBC: 141 ug/dL (ref 125–400)

## 2011-02-24 LAB — PREALBUMIN: Prealbumin: 3.9 mg/dL — ABNORMAL LOW (ref 17.0–34.0)

## 2011-02-24 LAB — GLUCOSE, CAPILLARY
Glucose-Capillary: 113 mg/dL — ABNORMAL HIGH (ref 70–99)
Glucose-Capillary: 126 mg/dL — ABNORMAL HIGH (ref 70–99)
Glucose-Capillary: 131 mg/dL — ABNORMAL HIGH (ref 70–99)

## 2011-02-24 LAB — CULTURE, RESPIRATORY W GRAM STAIN

## 2011-02-24 LAB — BASIC METABOLIC PANEL
CO2: 28 mEq/L (ref 19–32)
Calcium: 8 mg/dL — ABNORMAL LOW (ref 8.4–10.5)
Creatinine, Ser: 0.77 mg/dL (ref 0.50–1.35)
GFR calc Af Amer: >60 mL/min (ref >60)

## 2011-02-24 LAB — DIFFERENTIAL
Basophils Relative: 0 % (ref 0–1)
Eosinophils Absolute: 0.1 10*3/uL (ref 0.0–0.7)
Eosinophils Relative: 1 % (ref 0–5)
Lymphocytes Relative: 11 % — ABNORMAL LOW (ref 12–46)
Monocytes Relative: 9 % (ref 3–12)
Neutrophils Relative %: 79 % — ABNORMAL HIGH (ref 43–77)

## 2011-02-24 LAB — FERRITIN: Ferritin: 442 ng/mL — ABNORMAL HIGH (ref 22–322)

## 2011-02-24 LAB — MAGNESIUM: Magnesium: 2.4 mg/dL (ref 1.5–2.5)

## 2011-02-25 ENCOUNTER — Inpatient Hospital Stay (HOSPITAL_COMMUNITY): Payer: Medicaid Other

## 2011-02-25 DIAGNOSIS — J189 Pneumonia, unspecified organism: Secondary | ICD-10-CM

## 2011-02-25 DIAGNOSIS — Z9911 Dependence on respirator [ventilator] status: Secondary | ICD-10-CM

## 2011-02-25 DIAGNOSIS — K922 Gastrointestinal hemorrhage, unspecified: Secondary | ICD-10-CM

## 2011-02-25 DIAGNOSIS — J9600 Acute respiratory failure, unspecified whether with hypoxia or hypercapnia: Secondary | ICD-10-CM

## 2011-02-25 LAB — PHOSPHORUS: Phosphorus: 2.6 mg/dL (ref 2.3–4.6)

## 2011-02-25 LAB — CBC
HCT: 25.5 % — ABNORMAL LOW (ref 39.0–52.0)
HCT: 29.9 % — ABNORMAL LOW (ref 39.0–52.0)
Hemoglobin: 8.6 g/dL — ABNORMAL LOW (ref 13.0–17.0)
MCH: 29.5 pg (ref 26.0–34.0)
MCH: 29.1 pg (ref 26.0–34.0)
MCHC: 33.7 g/dL (ref 30.0–36.0)
MCHC: 33.4 g/dL (ref 30.0–36.0)
MCV: 87.3 fL (ref 78.0–100.0)
MCV: 87.2 fL (ref 78.0–100.0)
Platelets: 313 10*3/uL (ref 150–400)
Platelets: 310 10*3/uL (ref 150–400)
RBC: 3.44 MIL/uL — ABNORMAL LOW (ref 4.22–5.81)
RDW: 14.7 % (ref 11.5–15.5)
RDW: 14.7 % (ref 11.5–15.5)
RDW: 14.7 % (ref 11.5–15.5)
WBC: 9.6 10*3/uL (ref 4.0–10.5)
WBC: 9.8 10*3/uL (ref 4.0–10.5)

## 2011-02-25 LAB — BASIC METABOLIC PANEL
BUN: 12 mg/dL (ref 6–23)
BUN: 11 mg/dL (ref 6–23)
CO2: 30 mEq/L (ref 19–32)
Calcium: 8 mg/dL — ABNORMAL LOW (ref 8.4–10.5)
Chloride: 109 mEq/L (ref 96–112)
Creatinine, Ser: 0.83 mg/dL (ref 0.50–1.35)
Creatinine, Ser: 0.79 mg/dL (ref 0.50–1.35)
GFR calc Af Amer: >60 mL/min (ref >60)
GFR calc non Af Amer: >60 mL/min (ref >60)
Potassium: 3.4 mEq/L — ABNORMAL LOW (ref 3.5–5.1)

## 2011-02-25 LAB — GLUCOSE, CAPILLARY
Glucose-Capillary: 139 mg/dL — ABNORMAL HIGH (ref 70–99)
Glucose-Capillary: 147 mg/dL — ABNORMAL HIGH (ref 70–99)
Glucose-Capillary: 158 mg/dL — ABNORMAL HIGH (ref 70–99)

## 2011-02-25 LAB — CULTURE, BLOOD (ROUTINE X 2)
Culture  Setup Time: 201208241516
Culture: NO GROWTH

## 2011-02-25 LAB — COMPREHENSIVE METABOLIC PANEL
BUN: 10 mg/dL (ref 6–23)
CO2: 27 mEq/L (ref 19–32)
Calcium: 6.8 mg/dL — ABNORMAL LOW (ref 8.4–10.5)
Chloride: 111 mEq/L (ref 96–112)
Creatinine, Ser: 0.67 mg/dL (ref 0.50–1.35)
GFR calc Af Amer: >60 mL/min (ref >60)
GFR calc non Af Amer: >60 mL/min (ref >60)
Glucose, Bld: 123 mg/dL — ABNORMAL HIGH (ref 70–99)
Total Bilirubin: 0.4 mg/dL (ref 0.3–1.2)

## 2011-02-25 LAB — MAGNESIUM: Magnesium: 1.8 mg/dL (ref 1.5–2.5)

## 2011-02-25 LAB — PROTIME-INR: INR: 1.05 (ref 0.00–1.49)

## 2011-02-26 ENCOUNTER — Inpatient Hospital Stay (HOSPITAL_COMMUNITY): Payer: Medicaid Other

## 2011-02-26 LAB — POCT I-STAT 3, VENOUS BLOOD GAS (G3P V)
Patient temperature: 100.5
pCO2, Ven: 45.9 mmHg (ref 45.0–50.0)
pH, Ven: 7.402 — ABNORMAL HIGH (ref 7.250–7.300)

## 2011-02-26 LAB — CBC
Hemoglobin: 9.9 g/dL — ABNORMAL LOW (ref 13.0–17.0)
MCHC: 32.2 g/dL (ref 30.0–36.0)
MCHC: 34 g/dL (ref 30.0–36.0)
Platelets: 337 10*3/uL (ref 150–400)
RDW: 14.9 % (ref 11.5–15.5)
RDW: 14.5 % (ref 11.5–15.5)
WBC: 10.4 10*3/uL (ref 4.0–10.5)

## 2011-02-26 LAB — COMPREHENSIVE METABOLIC PANEL
ALT: 20 U/L (ref 0–53)
AST: 35 U/L (ref 0–37)
Alkaline Phosphatase: 63 U/L (ref 39–117)
CO2: 30 mEq/L (ref 19–32)
Calcium: 8.5 mg/dL (ref 8.4–10.5)
Chloride: 109 mEq/L (ref 96–112)
GFR calc non Af Amer: >60 mL/min (ref >60)
Potassium: 3.3 mEq/L — ABNORMAL LOW (ref 3.5–5.1)
Sodium: 145 mEq/L (ref 135–145)

## 2011-02-26 LAB — VANCOMYCIN, TROUGH: Vancomycin Tr: 24.9 ug/mL — ABNORMAL HIGH (ref 10.0–20.0)

## 2011-02-26 LAB — GLUCOSE, CAPILLARY
Glucose-Capillary: 135 mg/dL — ABNORMAL HIGH (ref 70–99)
Glucose-Capillary: 145 mg/dL — ABNORMAL HIGH (ref 70–99)
Glucose-Capillary: 157 mg/dL — ABNORMAL HIGH (ref 70–99)

## 2011-02-26 LAB — PROTIME-INR
INR: 1.16 (ref 0.00–1.49)
INR: 1.16 (ref 0.00–1.49)
Prothrombin Time: 15 seconds (ref 11.6–15.2)
Prothrombin Time: 15 seconds (ref 11.6–15.2)

## 2011-02-26 LAB — BASIC METABOLIC PANEL
CO2: 28 mEq/L (ref 19–32)
Calcium: 8.5 mg/dL (ref 8.4–10.5)
Creatinine, Ser: 0.78 mg/dL (ref 0.50–1.35)
GFR calc Af Amer: >60 mL/min (ref >60)
GFR calc non Af Amer: >60 mL/min (ref >60)
Sodium: 137 mEq/L (ref 135–145)

## 2011-02-26 NOTE — Consult Note (Signed)
[name] NO.:  [id number]  MEDICAL RECORD NO.:  [id number]  LOCATION:  2116                         FACILITY:  [facility]  PHYSICIAN:  [name], M.D. 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  He has been living in a nursing home.  About 3 weeks ago, he developed an abscess on his forearm that has subsequently been incised and drained, and has actually apparently been doing pretty good according to the family at the facility.  However, he said progressively dark tarry stools times about 24 hours.  He had a syncopal episode and was brought to the hospital by EMS.  He was found to have a hemoglobin of 8 concerning for significant GI bleed.  Since his admission, his hemoglobin has dropped to 6.1 after 2 units of packed red blood cells.  I think he is got at least four additional units if not 5.  He is continued to have tarry melena stools via rectal tube and has also had hematemesis.  He has been admitted to the intensive care unit and Dr. Ewing Cook has been consulted.  An EGD performed not to finds large stomach full of clot, but no obvious source of bleeding into the duodenum.  There appears to be a fresh clot adherent to a deep duodenal bulb ulcer.  There appears to be no active bleeding and no lesion that could be clipped or injected at present time.  Surgical consult is requested; (1) to look at the left forearm and (2) to be available for possible surgical intervention of a GI bleed.  PAST MEDICAL HISTORY:  Significant for: 1. Hypertension. 2. Benign prostatic hypertrophy. 3. 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  PAST SURGICAL HISTORY:  Cholecystectomy in December 2011.  SOCIAL HISTORY:  The patient again is a resident of nursing facility. He does smoke, denies any alcohol or illicit drug use.  FAMILY HISTORY:  Significant for cerebrovascular disease and hypertension.  ALLERGIES:  No known drug or latex allergies.  REVIEW OF SYSTEMS:  Please see history of present illness.  PHYSICAL EXAMINATION:  GENERAL APPEARANCE:  This is an intubated the patient in the ICU, does not appear in acute distress, though he is somewhat restless. CURRENT VITAL SIGNS:  Temperature of 97, blood pressure previously to a low of 63/37 and is now in the 120s-130s/70s and 80s.  Heart rate anywhere from 100-130, respiratory rate about 15. ENT:  Unremarkable. NECK:  Supple without lymphadenopathy. LUNGS:  Diminished breath sounds at the bases bilaterally, otherwise clear. HEART:  Tachycardic, but regular. ABDOMEN:  Soft, nontender.  Surgical scars are healed.  Hypoactive bowel sounds.  No mass effect. EXTREMITIES:  Examination of the left forearm on the dorsal aspect reveals an open wound with  some underlying granulation tissue; however, there is some significant tunneling to a smaller patch of necrotic skin. The suspect is tunnels, but ultimately there is not a whole lot of drainage coming out of the wound and no surrounding erythema or induration to suggest active infection.  DIAGNOSTICS:  Most recent labs again find a white blood cell count of 11.4, hemoglobin of 6.1, hematocrit of 17.3, platelet count of 310. Metabolic panel shows a sodium of 140, potassium 4.3, chloride of 110, CO2 of 22, BUN of 44, creatinine of 0.9, glucose of 171.  Liver enzymes within normal limits.  INR at 1.3.  IMAGING:  No imaging has been performed.  Endoscopy findings are noted.  IMPRESSION: 1. Upper gastrointestinal bleed secondary to  duodenal ulcer. 2. Left forearm wound secondary to drain, methicillin-resistant     Staphylococcus aureus abscess.  RECOMMENDATIONS:  Currently, there is no active bleeding seen on endoscopy, he appears to be becoming more hemodynamically stable with supportive care.  Would continue PPI drip.  We are currently on standby for OR have discussed the possibility of urgent surgical intervention with the patient's family, he understands.  At this time, I do not feel there is no much to offer for the left forearm wound and appears to be clean at some point, further in debridement could be performed that may facilitate wound healing, but certainly the upper GI bleed takes priority at this point.     Kevin El, PA-C   ______________________________ [name], M.D.    KB/MEDQ  D:  02/19/2011  T:  02/19/2011  Job:  [id number]  Electronically Signed by [name]  on 02/22/2011 12:02:29 PM Electronically Signed by [name] M.D. on 02/26/2011 05:05:01 PM

## 2011-02-27 ENCOUNTER — Inpatient Hospital Stay (HOSPITAL_COMMUNITY): Payer: Medicaid Other

## 2011-02-27 HISTORY — PX: EXPLORATORY LAPAROTOMY: SUR591

## 2011-02-27 LAB — CBC
Platelets: 394 10*3/uL (ref 150–400)
RBC: 3.41 MIL/uL — ABNORMAL LOW (ref 4.22–5.81)
WBC: 10.8 10*3/uL — ABNORMAL HIGH (ref 4.0–10.5)

## 2011-02-27 LAB — GLUCOSE, CAPILLARY
Glucose-Capillary: 137 mg/dL — ABNORMAL HIGH (ref 70–99)
Glucose-Capillary: 123 mg/dL — ABNORMAL HIGH (ref 70–99)
Glucose-Capillary: 123 mg/dL — ABNORMAL HIGH (ref 70–99)

## 2011-02-27 LAB — BASIC METABOLIC PANEL
BUN: 11 mg/dL (ref 6–23)
Calcium: 8.4 mg/dL (ref 8.4–10.5)
GFR calc Af Amer: >60 mL/min (ref >60)
GFR calc non Af Amer: >60 mL/min (ref >60)
GFR calc non Af Amer: >60 mL/min (ref >60)
Glucose, Bld: 122 mg/dL — ABNORMAL HIGH (ref 70–99)
Potassium: 3.3 mEq/L — ABNORMAL LOW (ref 3.5–5.1)
Potassium: 3.5 mEq/L (ref 3.5–5.1)
Sodium: 141 mEq/L (ref 135–145)

## 2011-02-27 LAB — PHOSPHORUS: Phosphorus: 3 mg/dL (ref 2.3–4.6)

## 2011-02-27 LAB — PROTIME-INR: Prothrombin Time: 14.5 seconds (ref 11.6–15.2)

## 2011-02-28 ENCOUNTER — Inpatient Hospital Stay (HOSPITAL_COMMUNITY): Payer: Medicaid Other

## 2011-02-28 LAB — BASIC METABOLIC PANEL
CO2: 25 mEq/L (ref 19–32)
Calcium: 8.4 mg/dL (ref 8.4–10.5)
Glucose, Bld: 133 mg/dL — ABNORMAL HIGH (ref 70–99)
Sodium: 141 mEq/L (ref 135–145)

## 2011-02-28 LAB — CBC
Hemoglobin: 10 g/dL — ABNORMAL LOW (ref 13.0–17.0)
MCH: 29.2 pg (ref 26.0–34.0)
MCHC: 33.4 g/dL (ref 30.0–36.0)
Platelets: 425 10*3/uL — ABNORMAL HIGH (ref 150–400)

## 2011-02-28 LAB — GLUCOSE, CAPILLARY
Glucose-Capillary: 129 mg/dL — ABNORMAL HIGH (ref 70–99)
Glucose-Capillary: 139 mg/dL — ABNORMAL HIGH (ref 70–99)

## 2011-02-28 LAB — CULTURE, BLOOD (ROUTINE X 2): Culture  Setup Time: 201208270047

## 2011-03-01 ENCOUNTER — Inpatient Hospital Stay (HOSPITAL_COMMUNITY): Payer: Medicaid Other

## 2011-03-01 LAB — CBC
HCT: 32.2 % — ABNORMAL LOW (ref 39.0–52.0)
Hemoglobin: 10.9 g/dL — ABNORMAL LOW (ref 13.0–17.0)
RBC: 3.68 MIL/uL — ABNORMAL LOW (ref 4.22–5.81)
WBC: 12.8 10*3/uL — ABNORMAL HIGH (ref 4.0–10.5)

## 2011-03-01 LAB — DIFFERENTIAL
Basophils Absolute: 0 10*3/uL (ref 0.0–0.1)
Lymphocytes Relative: 12 % (ref 12–46)
Monocytes Absolute: 0.9 10*3/uL (ref 0.1–1.0)
Neutro Abs: 10.1 10*3/uL — ABNORMAL HIGH (ref 1.7–7.7)

## 2011-03-01 LAB — COMPREHENSIVE METABOLIC PANEL
Alkaline Phosphatase: 83 U/L (ref 39–117)
BUN: 12 mg/dL (ref 6–23)
CO2: 23 mEq/L (ref 19–32)
Chloride: 107 mEq/L (ref 96–112)
GFR calc Af Amer: >60 mL/min (ref >60)
Glucose, Bld: 129 mg/dL — ABNORMAL HIGH (ref 70–99)
Potassium: 3.7 mEq/L (ref 3.5–5.1)
Total Bilirubin: 0.6 mg/dL (ref 0.3–1.2)

## 2011-03-01 LAB — PREALBUMIN: Prealbumin: 12 mg/dL — ABNORMAL LOW (ref 17.0–34.0)

## 2011-03-01 LAB — MAGNESIUM: Magnesium: 2.2 mg/dL (ref 1.5–2.5)

## 2011-03-02 ENCOUNTER — Inpatient Hospital Stay (HOSPITAL_COMMUNITY): Payer: Medicaid Other

## 2011-03-02 LAB — CBC
HCT: 30.8 % — ABNORMAL LOW (ref 39.0–52.0)
MCV: 87.5 fL (ref 78.0–100.0)
Platelets: 430 10*3/uL — ABNORMAL HIGH (ref 150–400)
RBC: 3.52 MIL/uL — ABNORMAL LOW (ref 4.22–5.81)
WBC: 12.6 10*3/uL — ABNORMAL HIGH (ref 4.0–10.5)

## 2011-03-02 LAB — BASIC METABOLIC PANEL
CO2: 26 mEq/L (ref 19–32)
GFR calc non Af Amer: >60 mL/min (ref >60)
Glucose, Bld: 133 mg/dL — ABNORMAL HIGH (ref 70–99)
Potassium: 3.7 mEq/L (ref 3.5–5.1)
Sodium: 139 mEq/L (ref 135–145)

## 2011-03-02 LAB — PROTIME-INR: INR: 1.01 (ref 0.00–1.49)

## 2011-03-02 LAB — CLOSTRIDIUM DIFFICILE BY PCR: Toxigenic C. Difficile by PCR: NEGATIVE

## 2011-03-02 NOTE — H&P (Signed)
NAMEMarland Kitchen  NORIS, KULINSKI NO.:  [id number]  MEDICAL RECORD NO.:  [id number]  LOCATION:  [facility]                         FACILITY:  [facility]  PHYSICIAN:  Eduard Clos, MDDATE OF BIRTH:  17-Feb-1951  DATE OF ADMISSION:  02/19/2011 DATE OF DISCHARGE:                             HISTORY & PHYSICAL   The patient's primary care physician is Dr. Leanord Hawking, and his Dr. [name].  CHIEF COMPLAINT:  Dark tarry stool.  HISTORY OF PRESENT ILLNESS:  The patient is a poor historian.  He gives only very limited history.  History obtained from the patient, ER physician, and previous e-chart.  A 60 year old male with known history of hypertension, BPH, and failure to thrive with progressive neurological disorder as per the e-chart.  He lives in a nursing home, has been having dark tarry stools yesterday evening and the EMS was called.  When EMS arrived, the patient also had a syncopal episode, do not know how long he had loss of consciousness. The patient stated he lost consciousness while he was in the bath tub. Subsequently, he was found to be hypoxic, and the patient was brought to the ER.  By the time the patient admitted to ER, the patient was only on 2 liter oxygen.  He was alert, awake, answering questions, hemoglobin checked at that time in the ER shows hemoglobin of 8 which when compared to 9 months ago in following December 2011 was 14.8, significant drop. His systolic blood pressure was on 100.  At this time, the patient has been already ordered 2 units of packed red blood cells.  The patient admitted for further workup.  The patient denies any headache, visual symptoms.  Denies any chest pain, shortness of breath, denies any nausea, vomiting, or abdominal pain.  Denies any dysuria, discharge, or diarrhea.  The patient states he does have chronic lower extremity pain and he uses walker for walking.  MEDICATION LIST:  The patient is on diclofenac.  PAST  MEDICAL HISTORY:  Hypertension, failure to thrive, BPH,  and the patient is also having a progressive neurological disorder which  I am not sure what type of it is.  One of the e-chart, discharge summary mentions it as multiple sclerosis, but the patient is not aware of it.  PAST SURGICAL HISTORY:  Cholecystectomy and knee surgery.  MEDICATIONS ON ADMISSION:  The patient is on Percocet 5/325 mg q.8 hourly p.r.n. for pain, Ambien 5 mg p.o. at bedtime p.r.n. for insomnia. Lisinopril 10 mg p.o. daily, diclofenac 50 mg p.o. twice daily, fluticasone nasal spray, finasteride 5 mg daily, chlorzoxazone 500 mg p.o. daily, docusate sodium 250 mg, doxycycline 100 mg p.o. q.8 hourly was prescribed for his left forearm infection.  I think it was taken on February 03, 2011.  ALLERGIES:  No known drug allergies.  FAMILY HISTORY:  Mother had CVA.  Father had history of hypertension.  SOCIAL HISTORY:  The patient smokes cigarettes.  Denies any alcohol or drug abuse.  He is used to use marijuana before.  REVIEW OF SYSTEMS:  As per the history of present illness nothing else significant.  PHYSICAL EXAMINATION:  GENERAL:  The patient examined at bedside not in  acute distress. VITAL SIGNS:  Blood pressure 102/50, pulse 80 per minute, temperature 98.1, respirations 18, O2 sat 100%. HEENT:  Anicteric.  No pallor.  No facial asymmetry.  The patient is able to see in both eyes.  PERRLA positive. CHEST:  Bilateral air entry present.  No rhonchi.  No crepitation. HEART:  S1 and S2 heard. ABDOMEN:  Soft, nontender.  Bowel sounds heard. CNS:  The patient is alert, awake, oriented to time, place, and person. He is able to move upper and lower extremities. EXTREMITIES:  Peripheral pulses are felt.  There is no acute ischemic changes, cyanosis or clubbing.  In the left arm, there is a small ulcerated area.  The patient has had recent I and D done 2 weeks ago. The wound looks healed.  There is no active  discharge.  LABORATORY DATA:  Labs, EKG shows normal sinus rhythm with heart rate around 86 beats per minute with nonspecific ST-T changes.  The ST-T changes are comparable to the old EKG.  CBC, WBCs 6.8, hemoglobin is 8, hematocrit is 23.2, platelets 340.  PT/INR 15.4 and 1.3.  Complete metabolic panel, sodium 140, potassium 4.3, chloride 110, carbon dioxide 22, glucose 171, BUN 44, creatinine 0.9, total bilirubin is 0.3, alkaline phosphatase 55, AST 22, ALT 20, total protein 5.3, albumin 2.4, calcium 8.2.  Fecal occult blood is positive.  ASSESSMENT: 1. Gastrointestinal, most likely upper. 2. Anemia from acute blood loss. 3. History of hypertension. 4. History of progressive neurologic disorder. 5. Ongoing tobacco abuse. 6. Recent left forearm abscess status post I and D 2 weeks ago, was on     doxycycline.  PLAN: 1. At this time, admit the patient to step-down unit. 2. For his GI bleed at this time, given his black tarry stools and     being on diclofenac probable cause could be an upper GI bleed.  At     this time, I am going to keep the patient n.p.o.  The patient is     already receiving 2 units of packed red blood cells.  We will     repeat his CT scan after transfusion.  We will gently hydrate the     patient and check CBC q.6 hourly at least for 4 times after the     transfusion.  At that time, that he could have upper GI bleed and     keep the patient on Protonix infusion, as the patient also was on     recent doxycycline.  I do not know if the patient had any diarrhea     but is a poor historian.  I will check stool for C diff PCR and we     will consult GI for further workup and including probably he will     need an EGD. 3. History of hypertension presently the patient is n.p.o.  I will     keep the patient on p.r.n. hydralazine for systolic blood pressure     more than 180. 4. At this time, also we will cycle cardiac markers, and we will get a     wound consult for  his left forearm wound which was recently     incision and drained for an abscess which looks healed at this     time.  Further recommendation based on the test order, clinic     course, and consult recommendations.     Eduard Clos, MD     ANK/MEDQ  D:  02/19/2011  T:  02/19/2011  Job:  [id number]  cc:   [name], M.D. Dr. Leanord Hawking  Electronically Signed by Midge Minium MD on 03/02/2011 09:51:11 AM

## 2011-03-03 ENCOUNTER — Inpatient Hospital Stay (HOSPITAL_COMMUNITY): Payer: Medicaid Other

## 2011-03-03 LAB — CBC
MCV: 88.5 fL (ref 78.0–100.0)
Platelets: 529 10*3/uL — ABNORMAL HIGH (ref 150–400)
RBC: 3.82 MIL/uL — ABNORMAL LOW (ref 4.22–5.81)
RDW: 15.5 % (ref 11.5–15.5)
WBC: 15.5 10*3/uL — ABNORMAL HIGH (ref 4.0–10.5)

## 2011-03-03 LAB — BASIC METABOLIC PANEL
BUN: 13 mg/dL (ref 6–23)
Calcium: 8.7 mg/dL (ref 8.4–10.5)
GFR calc Af Amer: >60 mL/min (ref >60)
GFR calc non Af Amer: >60 mL/min (ref >60)
Potassium: 3.8 mEq/L (ref 3.5–5.1)
Sodium: 139 mEq/L (ref 135–145)

## 2011-03-03 LAB — PROTIME-INR
INR: 0.98 (ref 0.00–1.49)
Prothrombin Time: 13.2 seconds (ref 11.6–15.2)

## 2011-03-04 ENCOUNTER — Inpatient Hospital Stay (HOSPITAL_COMMUNITY): Payer: Medicaid Other

## 2011-03-04 DIAGNOSIS — K260 Acute duodenal ulcer with hemorrhage: Secondary | ICD-10-CM

## 2011-03-04 LAB — PHOSPHORUS: Phosphorus: 2.9 mg/dL (ref 2.3–4.6)

## 2011-03-04 LAB — URINALYSIS, MICROSCOPIC ONLY
Bilirubin Urine: NEGATIVE
Hgb urine dipstick: NEGATIVE
Ketones, ur: NEGATIVE mg/dL
Nitrite: NEGATIVE
Protein, ur: NEGATIVE mg/dL
Specific Gravity, Urine: 1.016 (ref 1.005–1.030)
Urobilinogen, UA: 0.2 mg/dL (ref 0.0–1.0)

## 2011-03-04 LAB — CBC
HCT: 33.2 % — ABNORMAL LOW (ref 39.0–52.0)
Hemoglobin: 11 g/dL — ABNORMAL LOW (ref 13.0–17.0)
MCH: 29.5 pg (ref 26.0–34.0)
MCV: 89 fL (ref 78.0–100.0)
RBC: 3.73 MIL/uL — ABNORMAL LOW (ref 4.22–5.81)
WBC: 13.4 10*3/uL — ABNORMAL HIGH (ref 4.0–10.5)

## 2011-03-04 LAB — MAGNESIUM: Magnesium: 2.2 mg/dL (ref 1.5–2.5)

## 2011-03-05 LAB — CBC
HCT: 33.6 % — ABNORMAL LOW (ref 39.0–52.0)
Hemoglobin: 11 g/dL — ABNORMAL LOW (ref 13.0–17.0)
MCHC: 32.7 g/dL (ref 30.0–36.0)
RBC: 3.72 MIL/uL — ABNORMAL LOW (ref 4.22–5.81)
WBC: 14.3 10*3/uL — ABNORMAL HIGH (ref 4.0–10.5)

## 2011-03-05 LAB — BASIC METABOLIC PANEL
CO2: 26 mEq/L (ref 19–32)
Chloride: 110 mEq/L (ref 96–112)
Creatinine, Ser: 0.95 mg/dL (ref 0.50–1.35)
GFR calc Af Amer: >60 mL/min (ref >60)
Sodium: 144 mEq/L (ref 135–145)

## 2011-03-05 LAB — PROTIME-INR
INR: 1.02 (ref 0.00–1.49)
Prothrombin Time: 13.6 seconds (ref 11.6–15.2)

## 2011-03-07 ENCOUNTER — Inpatient Hospital Stay (HOSPITAL_COMMUNITY)
Admission: EM | Admit: 2011-03-07 | Discharge: 2011-03-15 | DRG: 326 | Disposition: A | Discharge status: Skilled Nursing Facility | Payer: Medicaid Other | Attending: Internal Medicine | Admitting: Internal Medicine

## 2011-03-07 ENCOUNTER — Emergency Department (HOSPITAL_COMMUNITY): Payer: Medicaid Other

## 2011-03-07 DIAGNOSIS — K264 Chronic or unspecified duodenal ulcer with hemorrhage: Principal | ICD-10-CM | POA: Diagnosis present

## 2011-03-07 DIAGNOSIS — R578 Other shock: Secondary | ICD-10-CM | POA: Diagnosis present

## 2011-03-07 DIAGNOSIS — D62 Acute posthemorrhagic anemia: Secondary | ICD-10-CM | POA: Diagnosis present

## 2011-03-07 DIAGNOSIS — R7309 Other abnormal glucose: Secondary | ICD-10-CM | POA: Diagnosis present

## 2011-03-07 DIAGNOSIS — J9600 Acute respiratory failure, unspecified whether with hypoxia or hypercapnia: Secondary | ICD-10-CM | POA: Diagnosis not present

## 2011-03-07 DIAGNOSIS — D696 Thrombocytopenia, unspecified: Secondary | ICD-10-CM | POA: Diagnosis present

## 2011-03-07 DIAGNOSIS — I10 Essential (primary) hypertension: Secondary | ICD-10-CM | POA: Diagnosis present

## 2011-03-07 DIAGNOSIS — N400 Enlarged prostate without lower urinary tract symptoms: Secondary | ICD-10-CM | POA: Diagnosis present

## 2011-03-07 DIAGNOSIS — Z8614 Personal history of Methicillin resistant Staphylococcus aureus infection: Secondary | ICD-10-CM

## 2011-03-07 DIAGNOSIS — K922 Gastrointestinal hemorrhage, unspecified: Secondary | ICD-10-CM

## 2011-03-07 DIAGNOSIS — D72829 Elevated white blood cell count, unspecified: Secondary | ICD-10-CM | POA: Diagnosis present

## 2011-03-07 LAB — CBC
HCT: 27.2 % — ABNORMAL LOW (ref 39.0–52.0)
Hemoglobin: 9 g/dL — ABNORMAL LOW (ref 13.0–17.0)
MCH: 30.2 pg (ref 26.0–34.0)
MCH: 30.2 pg (ref 26.0–34.0)
MCHC: 33.5 g/dL (ref 30.0–36.0)
MCHC: 34.6 g/dL (ref 30.0–36.0)
MCV: 90.2 fL (ref 78.0–100.0)
MCV: 87.2 fL (ref 78.0–100.0)
Platelets: DECREASED 10*3/uL (ref 150–400)
Platelets: 210 10*3/uL (ref 150–400)
RBC: 3 MIL/uL — ABNORMAL LOW (ref 4.22–5.81)
RBC: 1.49 MIL/uL — ABNORMAL LOW (ref 4.22–5.81)
RDW: 17.3 % — ABNORMAL HIGH (ref 11.5–15.5)
WBC: 14.1 10*3/uL — ABNORMAL HIGH (ref 4.0–10.5)

## 2011-03-07 LAB — POCT I-STAT, CHEM 8
BUN: 28 mg/dL — ABNORMAL HIGH (ref 6–23)
Creatinine, Ser: 1 mg/dL (ref 0.50–1.35)
Hemoglobin: 9.5 g/dL — ABNORMAL LOW (ref 13.0–17.0)
Potassium: 4.4 mEq/L (ref 3.5–5.1)
Sodium: 143 mEq/L (ref 135–145)
TCO2: 23 mmol/L (ref 0–100)

## 2011-03-07 LAB — URINE MICROSCOPIC-ADD ON

## 2011-03-07 LAB — DIFFERENTIAL
Basophils Absolute: 0 10*3/uL (ref 0.0–0.1)
Basophils Relative: 0 % (ref 0–1)
Lymphocytes Relative: 15 % (ref 12–46)
Monocytes Relative: 6 % (ref 3–12)
Neutro Abs: 13.3 10*3/uL — ABNORMAL HIGH (ref 1.7–7.7)
Neutrophils Relative %: 78 % — ABNORMAL HIGH (ref 43–77)

## 2011-03-07 LAB — URINALYSIS, ROUTINE W REFLEX MICROSCOPIC
Glucose, UA: NEGATIVE mg/dL
Ketones, ur: NEGATIVE mg/dL
Specific Gravity, Urine: 1.019 (ref 1.005–1.030)
pH: 5 (ref 5.0–8.0)

## 2011-03-07 LAB — PROTIME-INR: Prothrombin Time: 14.7 seconds (ref 11.6–15.2)

## 2011-03-08 ENCOUNTER — Inpatient Hospital Stay (HOSPITAL_COMMUNITY): Payer: Medicaid Other

## 2011-03-08 DIAGNOSIS — K260 Acute duodenal ulcer with hemorrhage: Secondary | ICD-10-CM

## 2011-03-08 DIAGNOSIS — J9600 Acute respiratory failure, unspecified whether with hypoxia or hypercapnia: Secondary | ICD-10-CM

## 2011-03-08 DIAGNOSIS — I509 Heart failure, unspecified: Secondary | ICD-10-CM

## 2011-03-08 LAB — BLOOD GAS, ARTERIAL
Acid-base deficit: 1.3 mmol/L (ref 0.0–2.0)
Drawn by: 20575
FIO2: 0.6 %
MECHVT: 600 mL
O2 Saturation: 99.9 %
Patient temperature: 98.6
RATE: 16 resp/min

## 2011-03-08 LAB — COMPREHENSIVE METABOLIC PANEL
Alkaline Phosphatase: 49 U/L (ref 39–117)
BUN: 19 mg/dL (ref 6–23)
Calcium: 7.7 mg/dL — ABNORMAL LOW (ref 8.4–10.5)
Creatinine, Ser: 0.75 mg/dL (ref 0.50–1.35)
GFR calc Af Amer: >60 mL/min (ref >60)
Glucose, Bld: 127 mg/dL — ABNORMAL HIGH (ref 70–99)
Potassium: 3.6 mEq/L (ref 3.5–5.1)
Total Protein: 4.5 g/dL — ABNORMAL LOW (ref 6.0–8.3)

## 2011-03-08 LAB — PROTIME-INR: Prothrombin Time: 14.7 seconds (ref 11.6–15.2)

## 2011-03-08 LAB — GLUCOSE, CAPILLARY
Glucose-Capillary: 97 mg/dL (ref 70–99)
Glucose-Capillary: 116 mg/dL — ABNORMAL HIGH (ref 70–99)

## 2011-03-08 LAB — CBC
MCH: 30 pg (ref 26.0–34.0)
MCHC: 35.2 g/dL (ref 30.0–36.0)
MCV: 85.3 fL (ref 78.0–100.0)
Platelets: 64 10*3/uL — ABNORMAL LOW (ref 150–400)
RDW: 14.5 % (ref 11.5–15.5)

## 2011-03-08 LAB — POCT I-STAT EG7
Calcium, Ion: 0.92 mmol/L — ABNORMAL LOW (ref 1.12–1.32)
Hemoglobin: 7.8 g/dL — ABNORMAL LOW (ref 13.0–17.0)
Potassium: 4.8 mEq/L (ref 3.5–5.1)
TCO2: 18 mmol/L (ref 0–100)
pCO2, Ven: 40.2 mmHg — ABNORMAL LOW (ref 45.0–50.0)
pH, Ven: 7.224 — ABNORMAL LOW (ref 7.250–7.300)
pO2, Ven: 36 mmHg (ref 30.0–45.0)

## 2011-03-08 LAB — POCT I-STAT 3, ART BLOOD GAS (G3+)
Acid-base deficit: 1 mmol/L (ref 0.0–2.0)
pO2, Arterial: 115 mmHg — ABNORMAL HIGH (ref 80.0–100.0)

## 2011-03-08 LAB — BASIC METABOLIC PANEL
BUN: 18 mg/dL (ref 6–23)
Chloride: 114 mEq/L — ABNORMAL HIGH (ref 96–112)
GFR calc Af Amer: >60 mL/min (ref >60)
Potassium: 3.9 mEq/L (ref 3.5–5.1)

## 2011-03-09 ENCOUNTER — Inpatient Hospital Stay (HOSPITAL_COMMUNITY): Payer: Medicaid Other

## 2011-03-09 DIAGNOSIS — J9600 Acute respiratory failure, unspecified whether with hypoxia or hypercapnia: Secondary | ICD-10-CM

## 2011-03-09 DIAGNOSIS — I509 Heart failure, unspecified: Secondary | ICD-10-CM

## 2011-03-09 DIAGNOSIS — K260 Acute duodenal ulcer with hemorrhage: Secondary | ICD-10-CM

## 2011-03-09 LAB — PREPARE FRESH FROZEN PLASMA
Unit division: 0
Unit division: 0
Unit division: 0
Unit division: 0
Unit division: 0

## 2011-03-09 LAB — GLUCOSE, CAPILLARY
Glucose-Capillary: 109 mg/dL — ABNORMAL HIGH (ref 70–99)
Glucose-Capillary: 114 mg/dL — ABNORMAL HIGH (ref 70–99)
Glucose-Capillary: 107 mg/dL — ABNORMAL HIGH (ref 70–99)

## 2011-03-09 LAB — PREPARE PLATELET PHERESIS: Unit division: 0

## 2011-03-09 LAB — COMPREHENSIVE METABOLIC PANEL
ALT: 26 U/L (ref 0–53)
AST: 39 U/L — ABNORMAL HIGH (ref 0–37)
Albumin: 2.4 g/dL — ABNORMAL LOW (ref 3.5–5.2)
CO2: 27 mEq/L (ref 19–32)
Calcium: 8.1 mg/dL — ABNORMAL LOW (ref 8.4–10.5)
Chloride: 113 mEq/L — ABNORMAL HIGH (ref 96–112)
GFR calc non Af Amer: >60 mL/min (ref >60)
Sodium: 145 mEq/L (ref 135–145)

## 2011-03-09 LAB — CBC
MCHC: 35.3 g/dL (ref 30.0–36.0)
RDW: 15.6 % — ABNORMAL HIGH (ref 11.5–15.5)

## 2011-03-09 LAB — SODIUM, URINE, RANDOM: Sodium, Ur: 200 mEq/L

## 2011-03-09 LAB — URINE CULTURE

## 2011-03-09 LAB — PRO B NATRIURETIC PEPTIDE: Pro B Natriuretic peptide (BNP): 464.4 pg/mL — ABNORMAL HIGH (ref 0–125)

## 2011-03-09 MED ORDER — IOHEXOL 300 MG/ML  SOLN: 50.0000 mL | Freq: Once | INTRAMUSCULAR | Status: AC | PRN

## 2011-03-10 LAB — TYPE AND SCREEN
ABO/RH(D): O POS
Antibody Screen: NEGATIVE
Unit division: 0
Unit division: 0
Unit division: 0
Unit division: 0
Unit division: 0
Unit division: 0
Unit division: 0
Unit division: 0
Unit division: 0
Unit division: 0

## 2011-03-10 LAB — CBC
HCT: 36.2 % — ABNORMAL LOW (ref 39.0–52.0)
HCT: 38.4 % — ABNORMAL LOW (ref 39.0–52.0)
HCT: 36.5 % — ABNORMAL LOW (ref 39.0–52.0)
Hemoglobin: 12.4 g/dL — ABNORMAL LOW (ref 13.0–17.0)
Hemoglobin: 13.3 g/dL (ref 13.0–17.0)
MCH: 30.6 pg (ref 26.0–34.0)
MCHC: 34.6 g/dL (ref 30.0–36.0)
MCHC: 34.2 g/dL (ref 30.0–36.0)
MCV: 87.9 fL (ref 78.0–100.0)
MCV: 88.3 fL (ref 78.0–100.0)
MCV: 88.4 fL (ref 78.0–100.0)
Platelets: 139 10*3/uL — ABNORMAL LOW (ref 150–400)
RBC: 4.12 MIL/uL — ABNORMAL LOW (ref 4.22–5.81)
RBC: 4.35 MIL/uL (ref 4.22–5.81)
RDW: 15.3 % (ref 11.5–15.5)
RDW: 15.1 % (ref 11.5–15.5)
WBC: 13.5 10*3/uL — ABNORMAL HIGH (ref 4.0–10.5)
WBC: 12.7 10*3/uL — ABNORMAL HIGH (ref 4.0–10.5)

## 2011-03-10 LAB — GLUCOSE, CAPILLARY
Glucose-Capillary: 101 mg/dL — ABNORMAL HIGH (ref 70–99)
Glucose-Capillary: 91 mg/dL (ref 70–99)
Glucose-Capillary: 94 mg/dL (ref 70–99)
Glucose-Capillary: 96 mg/dL (ref 70–99)

## 2011-03-10 LAB — BASIC METABOLIC PANEL
BUN: 19 mg/dL (ref 6–23)
CO2: 26 mEq/L (ref 19–32)
Chloride: 110 mEq/L (ref 96–112)
Creatinine, Ser: 0.81 mg/dL (ref 0.50–1.35)
GFR calc Af Amer: >60 mL/min (ref >60)
Glucose, Bld: 95 mg/dL (ref 70–99)
Potassium: 3.3 mEq/L — ABNORMAL LOW (ref 3.5–5.1)

## 2011-03-10 LAB — LACTIC ACID, PLASMA: Lactic Acid, Venous: 1.2 mmol/L (ref 0.5–2.2)

## 2011-03-10 LAB — PROTIME-INR: Prothrombin Time: 14.5 seconds (ref 11.6–15.2)

## 2011-03-11 DIAGNOSIS — I509 Heart failure, unspecified: Secondary | ICD-10-CM

## 2011-03-11 DIAGNOSIS — K260 Acute duodenal ulcer with hemorrhage: Secondary | ICD-10-CM

## 2011-03-11 DIAGNOSIS — J9600 Acute respiratory failure, unspecified whether with hypoxia or hypercapnia: Secondary | ICD-10-CM

## 2011-03-11 LAB — CBC
HCT: 36.2 % — ABNORMAL LOW (ref 39.0–52.0)
HCT: 40.1 % (ref 39.0–52.0)
MCH: 29.7 pg (ref 26.0–34.0)
MCH: 30.5 pg (ref 26.0–34.0)
MCHC: 33.7 g/dL (ref 30.0–36.0)
MCHC: 33.7 g/dL (ref 30.0–36.0)
MCHC: 34.7 g/dL (ref 30.0–36.0)
MCV: 88.3 fL (ref 78.0–100.0)
MCV: 88.7 fL (ref 78.0–100.0)
MCV: 88.3 fL (ref 78.0–100.0)
MCV: 87.9 fL (ref 78.0–100.0)
Platelets: 135 10*3/uL — ABNORMAL LOW (ref 150–400)
Platelets: 147 10*3/uL — ABNORMAL LOW (ref 150–400)
Platelets: 169 10*3/uL (ref 150–400)
Platelets: 184 10*3/uL (ref 150–400)
RBC: 4.09 MIL/uL — ABNORMAL LOW (ref 4.22–5.81)
RDW: 15 % (ref 11.5–15.5)
RDW: 14.9 % (ref 11.5–15.5)
RDW: 14.8 % (ref 11.5–15.5)
RDW: 14.8 % (ref 11.5–15.5)
WBC: 10.4 10*3/uL (ref 4.0–10.5)
WBC: 9.5 10*3/uL (ref 4.0–10.5)

## 2011-03-11 LAB — GLUCOSE, CAPILLARY
Glucose-Capillary: 73 mg/dL (ref 70–99)
Glucose-Capillary: 120 mg/dL — ABNORMAL HIGH (ref 70–99)
Glucose-Capillary: 97 mg/dL (ref 70–99)

## 2011-03-11 LAB — BASIC METABOLIC PANEL
BUN: 15 mg/dL (ref 6–23)
Chloride: 109 mEq/L (ref 96–112)
Creatinine, Ser: 0.75 mg/dL (ref 0.50–1.35)
GFR calc Af Amer: >60 mL/min (ref >60)
GFR calc non Af Amer: >60 mL/min (ref >60)

## 2011-03-11 LAB — MAGNESIUM: Magnesium: 1.9 mg/dL (ref 1.5–2.5)

## 2011-03-11 NOTE — Op Note (Signed)
NAMEBRIGG, [name] NO.:  [id number]  MEDICAL RECORD NO.:  [id number]  LOCATION:  2102                         FACILITY:  [facility]  PHYSICIAN:  [name], M.D.   DATE OF BIRTH:  1950/10/30  DATE OF PROCEDURE:  03/07/2011 DATE OF DISCHARGE:                              OPERATIVE REPORT   PROCEDURE:  Esophagoscopy.  INDICATIONS:  A 60 year old gentleman 2 weeks status post massive, destabilizing GI bleed from a very large duodenal ulcer associated with Voltaren exposure.  The patient underwent oversew of the duodenal ulcer and pyloroplasty, and had prolonged intubation postoperatively.  He was home from the hospital just 2 days when the nursing facility where he was rehabilitating noted melenic stool.  He returned to the hospital and had a nominal melenic stool, and since admission, has had maroon stools and drop in pressure down into the 70s.  Hemoglobin after admission had fallen to approximately 7, BUN had risen to 28.  FINDINGS:  Large red clots emanating from the pyloric channel region.  PROCEDURE:  The procedure was done at the bedside in the ICU, with no sedation in view of the patient's clinical instability, including a heart rate of 115 and a systolic blood pressure of 69.  The patient had provided written consent.  It was difficult to communicate with this patient who has a rather withdrawn, vacant affect but he did not have any questions about the procedure.  With the patient in the left lateral decubitus position, the Pentax therapeutic endoscope was passed under direct vision.  The larynx was grossly normal and the esophagus was readily entered.  Liquid dark red blood was refluxing up into the mid esophageal region. Suction was applied and the scope was advanced down into the stomach where I encountered a moderate sized bright red clot.  The gastric mucosa was normal in all areas visualized, including a retroflex view of the cardia.  In  the antral region, emanating from the pyloric region, there was a very large red gelatinous clot.  It was not felt that this was amenable to removal and in view of the patient's somewhat tenuous clinical status, I opted not to prolong the procedure by trying to dislodge the clot.  Therefore, the pyloric region was never traversed, the duodenum was not seen, and an exact source of bleeding was not identified, nor was any therapeutic intervention undertaken.  The patient made it through the procedure without evident complication or acute destabilization.  IMPRESSION: 1. Presumed bleed from the pyloric or duodenal region, see above     discussion. 2. No evidence of varices, Mallory-Weiss tear, or diffuse gastritis as     a source of the bleeding.  RECOMMENDATIONS:  I have discussed the case with Dr. Michaell Cowing of Surgery and it is anticipated that surgical management will be the next step. This patient has had a significant, destabilizing rebleed, and even if he stopped at the moment, he would presumably be at very high risk for yet another bleeding episode.  His condition does not appear to be amendable to endoscopic intervention.  We would be happy to see the patient again at your request if it is felt that we  can be of further help.          ______________________________ [name], M.D.     RB/MEDQ  D:  03/07/2011  T:  03/08/2011  Job:  [id number]  cc:   Ardeth Sportsman, MD  Electronically Signed by [name] M.D. on 03/11/2011 11:17:22 AM

## 2011-03-12 LAB — CBC
HCT: 41.4 % (ref 39.0–52.0)
Hemoglobin: 14.1 g/dL (ref 13.0–17.0)
MCH: 30.1 pg (ref 26.0–34.0)
MCH: 30.8 pg (ref 26.0–34.0)
MCHC: 34.1 g/dL (ref 30.0–36.0)
MCHC: 35 g/dL (ref 30.0–36.0)
MCV: 88.5 fL (ref 78.0–100.0)
MCV: 88 fL (ref 78.0–100.0)
Platelets: 189 10*3/uL (ref 150–400)
RBC: 4.42 MIL/uL (ref 4.22–5.81)

## 2011-03-12 LAB — BASIC METABOLIC PANEL
BUN: 13 mg/dL (ref 6–23)
Creatinine, Ser: 0.77 mg/dL (ref 0.50–1.35)
GFR calc non Af Amer: >60 mL/min (ref >60)
Glucose, Bld: 94 mg/dL (ref 70–99)
Potassium: 3.5 mEq/L (ref 3.5–5.1)

## 2011-03-13 LAB — CBC
HCT: 39 % (ref 39.0–52.0)
MCH: 30.3 pg (ref 26.0–34.0)
MCHC: 34.1 g/dL (ref 30.0–36.0)
MCHC: 34.2 g/dL (ref 30.0–36.0)
MCV: 88.4 fL (ref 78.0–100.0)
Platelets: 184 10*3/uL (ref 150–400)
RBC: 4.45 MIL/uL (ref 4.22–5.81)
RDW: 14.5 % (ref 11.5–15.5)

## 2011-03-13 NOTE — Discharge Summary (Signed)
[name], [name]               ACCOUNT NO.:  [id number]  MEDICAL RECORD NO.:  [id number]  LOCATION:  5511                         FACILITY:  [facility]  PHYSICIAN:  Charlaine Dalton. Sherene Sires, MD, FCCPDATE OF BIRTH:  Feb 05, 1951  DATE OF ADMISSION:  02/19/2011 DATE OF DISCHARGE:                              DISCHARGE SUMMARY   FINAL DIAGNOSES: 1. Status post upper gastrointestinal bleed secondary to duodenal     ulcer. 2. Hemorrhagic shock secondary to upper gastrointestinal bleed. 3. Status post exploratory laparotomy. 4. Oversewing of duodenal ulcer. 5. Acute respiratory failure secondary to gastrointestinal bleed,     hemorrhagic shock, status post exploratory laparotomy, complicated     by ventilator associated pneumonia (no organism specified). 6. 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 7. Methicillin-resistant Staphylococcus aureus cellulitis of left     forearm. 8. Hypertension. 9. Debility. 10.Blood loss anemia, complicated, anemia of critical illness.  PROCEDURES:  Endotracheal tube placed on August 24 and removed on August 31.  ADDITIONAL PROCEDURES: 1. Right IJ triple-lumen catheter, this was placed on August 24 and     removed on August 29. 2. Right PICC line placed on August 29. 3. Left brachial A-line placed on hospital admit day and removed on     August 28.  EGD performed by Dr. Leary Roca on August 24.  Exploratory laparotomy with oversew of duodenal ulcer and pyloroplasty by [name] on August 24.  On Nov 21, 2010, he had open abdomen after oversew of duodenal ulcer pyloroplasty, at that point the abdomen was closed.  LABORATORY DATA:  On February 28, 2011, sodium 141, potassium 3.3, chloride 110, CO2 of 25, glucose 133, BUN 12, creatinine 0.83.  PT/INR was 15.4/1.19.  CBC, white blood cell count 11.8, hemoglobin 10, hematocrit 29.9, and platelet count 425.  Chest x-ray on February 28, 2011, demonstrates no acute  findings.  BRIEF HISTORY:  A 60 year old male patient, who resides at a skilled nursing facility secondary to progressive poorly defined neuromuscular illness resulting in debilitation, felt to possibly be with multiple sclerosis.  He was transferred on February 19, 2011 after syncopal event, also with noted melena.  He developed hemorrhagic shock, underwent emergent EGD demonstrating a large bleeding and large duodenal ulcer. At time of EGD, the ulcer was not bleeding.  Later that afternoon following EGD he developed rebleed, necessitating an estimated 24 units of blood products with the combination of both packed red blood cells and FFP.  He was brought emergently to the operating room on the 24th for exploratory laparotomy.  He therefore had earlier been transferred to the Critical Care Service for hemorrhagic shock.  HOSPITAL COURSE BY DISCHARGE DIAGNOSIS: 1. Acute upper gastrointestinal bleed secondary to bleeding duodenal     ulcer with resultant hemorrhagic shock.  [name] as previously     mentioned was admitted to the intensive care with report of dark     tarry stools, and low hemoglobin.  He was initially admitted to the     medical service and transfused to 2 units of blood.  Unfortunately     hemoglobin dropped from hospital admit level, in spite of 2 units  of blood suggestive continued active bleeding.  Because of this, a     central access was obtained.  His hemoglobin continued to drop in     fact he became hemodynamically unstable necessitating large-volume     central access and rapid transfusion.  An EGD was performed by Dr.     Leary Roca, demonstrating large duodenal ulcer as previously     mentioned.  Surgical services were then consulted.  Initially, the     ulcer was not bleeding, so conservative approach was favored.     Unfortunately later that afternoon [name], began to bleed once     again with central venous pressures being monitored, he actually      went from central venous pressure, pressure of 12 precipitously     down to a central venous pressure to with a systolic blood     pressures to rapidly falling and precipitating and shock requiring     rapid transfusion administration of blood products.  He was given 3     units of blood at that point via rapid transfuse, also 2 units of     FFP via rapid transfuse, and he was brought emergently to the     operating room at which time he underwent exploration.     Exploration, he underwent oversew of duodenal ulcers long as     pyloroplasty, initially he was left open following surgery.  He     went back for closure of wound on February 21, 2011.  Since that time     his hemoglobin has been stable.  And he has been slowly progressing     from a surgical inch and GI bleed standpoint.  At this point,     remains on central venous TNA.  As I mentioned above, his     hemoglobin stable.  General Surgery Services continue to follow in     regards to recommendations for nutritional advancement.  Further     recommendations in regards to postoperative care, as well as     nutritional advancement will be deferred to Surgical services. 2. Respiratory failure secondary to acute upper gastrointestinal     bleed.  [name] was intubated prophylactically in preparation     for EGD.  Unfortunately, he did develop progressive pulmonary     infiltrates.  It is unclear as to whether or not this was a     ventilator associated pneumonia or perhaps transfusion related to     acute lung injury.  He did require prolonged mechanical ventilation     up until February 26, 2011.  He has since been liberated from the     mechanical ventilator with improvement in the chest x-ray     radiographically.  Cultures were sent, no organisms have been     identified from his sputum.  Although we will continue him on     empiric antibiotics, and for four more days upon time of dictation     he is now on day #6 of 10 Zosyn,  and #9 of 10 vancomycin. 3. Chronic debilitation.  Question of multiple sclerosis.  This is a     chronic issue.  [name] actually resides in a skilled nursing     facility secondary to debilitation.  No acute diagnostics have been     applied towards his chronic illness. 4. Hypertension.  At this point, we continue to hold his     antihypertensives. 5. Cellulitis. 6.  History of left upper extremity cellulitis, for this will continue     at MRSA coverage with one more day of vancomycin. 7. Debilitation.  For this, he has PT/OT consult.  DISPOSITION:  [name] met maximum benefit from Critical Care Services.  He is now being transferred to [facility] where the remainder of his primary care will be provided. His discharge diet and discharge medications will be deferred at this time.     Zenia Resides, NP   ______________________________ Charlaine Dalton. Sherene Sires, MD, FCCP    PB/MEDQ  D:  02/28/2011  T:  02/28/2011  Job:  [id number]  Electronically Signed by Zenia Resides NP on 03/05/2011 01:01:23 PM Electronically Signed by Sandrea Hughs MD FCCP on 03/13/2011 08:19:13 AM

## 2011-03-14 LAB — CBC
Hemoglobin: 13 g/dL (ref 13.0–17.0)
Platelets: 169 10*3/uL (ref 150–400)
RBC: 4.29 MIL/uL (ref 4.22–5.81)
RDW: 14.4 % (ref 11.5–15.5)
WBC: 6.9 10*3/uL (ref 4.0–10.5)
WBC: 7.5 10*3/uL (ref 4.0–10.5)

## 2011-03-15 LAB — CBC
HCT: 39.2 % (ref 39.0–52.0)
MCV: 90.5 fL (ref 78.0–100.0)
Platelets: 204 10*3/uL (ref 150–400)
RBC: 4.33 MIL/uL (ref 4.22–5.81)
WBC: 7.9 10*3/uL (ref 4.0–10.5)

## 2011-03-16 NOTE — Op Note (Signed)
NAMESIDDH, Cook               ACCOUNT NO.:  [id number]  MEDICAL RECORD NO.:  [id number]  LOCATION:                                 FACILITY:  PHYSICIAN:  Sandria Bales. Ezzard Standing, M.D.  DATE OF BIRTH:  1950-10-06  DATE OF PROCEDURE:  03/08/2011                              OPERATIVE REPORT  PREOPERATIVE DIAGNOSIS:  Upper gastrointestinal bleed, probably secondary to recurrent posterior duodenal ulcer.  POSTOPERATIVE DIAGNOSIS:  Upper gastrointestinal bleed secondary to posterior duodenal bulb ulcer/gastroduodenal artery.  PROCEDURE:  Exploratory laparotomy with duodenotomy and oversew of ulcer/gastric duodenal artery.  SURGEON:  Sandria Bales. Ezzard Standing, MD  FIRST ASSISTANT:  Juanetta Gosling, MD  ANESTHESIA:  General.  ESTIMATED BLOOD LOSS:  500 mL.  He was given at least 9 units of packed cells, 7 units of FFPs during the procedure.  INDICATIONS FOR PROCEDURE:  [name] is a 60 year old African American male who was hospitalized at the end of August and had an upper GI bleed requiring exploratory laparotomy with oversew of duodenal ulcer by Dr. Felicity Pellegrini on the February 19, 2011.  He was intubated for some 10-14 days.  He was finally discharged home about 2 days ago, but then reappeared today with a recurrent upper GI bleed.  Dr. Matthias Cook endoscoped him this evening.  He had a good look in his stomach and saw blood coming from the pyloric channel.  The patient was hypotensive with pressures in the 60s-70s. His hemoglobin was 7 after 4 units of blood.  It was felt he would best be reexplored for probable recurrent bleeding from this duodenal bulb ulcer. Dr. Michaell Cowing has seen him for out practice, Dr. Dwain Sarna, the doctor on call is in the OR with a chest trauma, and I am available to do the surgery.  I spoke briefly with Dr. Matthias Cook about his findings.  The patient unfortunately was obtunded, really could not explain much of this operation process, really an emergency procedure.  He  has no identifiable family contact.  No parents or sister because at the time we tried to call was unsuccessful.  OPERATIVE NOTE:  The patient was taken to room #17.  Anesthesia supervised by Dr. Laverle Hobby.  He was given a gram of Invanz before the operation.  He was intubated.  A time-out was held and the surgical checklist run.  Dr. Katrinka Blazing put in a central line first at the beginning of the procedure. His abdomen was prepped with Betadine and sterilely draped.  He had a partially open wound that was already contaminated purulent, so this wound itself would be either class III or class IV.  I broke through this wound and cut out the sutures and got back into his abdominal cavity.  He had about 500 mL of clear ascitic fluid.  His bowel, particularly his colon and stomach were noted be fairly distended and these were combination of both air, possibly  endoscopy earlier yesterday and blood in his stomach.  I really did a limited exploration of his upper abdomen.  His right and left lobes of the liver were unremarkable.  His gallbladder was absent. It has actually been removed previously.  An NG tube  was in the stomach. The stomach itself was distended, but somewhat decompressed with an NG tube.    He has adhesions under the right lobe of his liver with the colon actually stuck over this prior duodenal surgery by [name].  We found the silk sutures.  We were able to mobilize the transverse colon up.  He had some small bowel ulcer stuck to the transverse colon that I took down.  I then broke into the duodenum and the duodenal bulb right beyond the pylorus may be 3 cm.  He had this 2 cm to 3 cm posterior bulb ulcer with a visible pumping actively.  I got to control this and oversewed this with 2-0 silk suture.  I put about five or six 2-0 silk sutures there.  There was evidence of sutures that [name] had placed previously that appeared to be in the similar location but after  these were tabbed, the bleeding had stopped.  He had oozing from his mucosa and actually his hemoglobin right before taken back to OR had gone down to 4.  We gave him some 7+ units of blood and fresh FPs, hemoglobin back up to 7 in the OR, but he was becoming coagulopathic with oozing from his mucosal lining, oozing from his duodenotomy  We then irrigated his abdomen with about a liter of saline.  The duodenotomy was really right beyond the pylorus.  It was sort of an angle and transverse.  Closed it with interrupted 2-0 silk sutures.  We placed some Evicel about 3-4 mL in the ulcer itself, then closed the duodenum with this interrupted 2-0 silk sutures, and then placed about the remaining 6 or 7 mL of Evicel on the anterior surface of the duodenum.  I tried to do a Psychologist, prison and probation services.  I took the 2-0 silk ties and took some very limited omentum that he had and patch just over the duodenum.  I then brought a drain out through the right upper quadrant. I tried to put this drain underneath the duodenum.  He was doing some oozing just from coagulopathy.  I then closed the abdomen with 2 running #1 PDS sutures. About every third suture, I interrupted with a #1 Novafil suture.  I did not try to close the wound.  I packed it open with Betadine gauze.    The patient was then transferred to 2100, intubated,  in critical condition.  I talked to Dr. Coralyn Helling about the findings of the surgery and asked critical care for his assistance in both ventilator and critical care management postop.   Sandria Bales. Ezzard Standing, M.D., FACS   DHN/MEDQ  D:  03/08/2011  T:  03/08/2011  Job:  [id number]  cc:   Lonia Blood, M.D. Coralyn Helling, MD [name], M.D.  Electronically Signed by Ovidio Kin M.D. on 03/16/2011 02:51:37 PM

## 2011-03-17 NOTE — Consult Note (Signed)
  NAMEMarland Kitchen  Kevin, Cook NO.:  [id number]  MEDICAL RECORD NO.:  [id number]  LOCATION:  2116                         FACILITY:  [facility]  PHYSICIAN:  Petra Kuba, M.D.    DATE OF BIRTH:  04/29/1951  DATE OF CONSULTATION:  02/19/2011 DATE OF DISCHARGE:                                CONSULTATION   HISTORY:  Patient with GI bleeding is currently intubated and on pressors due to instability, had been on nonsteroidals in the past but has not had any previous ulcers or GI problem from the review of the chart.  He has had some melena for 24 hours at home and does have significant arthritis and back problems.  He has also had a cholecystectomy and knee surgery.  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  MEDICINES AT HOME:  Percocet, Ambien, lisinopril, diclofenac, nasal spray, finasteride, chlorzoxazone, doxycycline.  ALLERGIES:  None.  FAMILY HISTORY:  Negative for any obvious GI issues.SOCIAL HISTORY:  He has been smoking cigarettes but denies alcohol.  REVIEW OF SYSTEMS:  Unobtainable.  PHYSICAL EXAMINATION:  GENERAL:  The patient was intubated, not responsive due to sedation. LUNGS:  Clear. HEART:  Regular rate and rhythm. ABDOMEN:  Little firm.  No obvious tenderness.  LABORATORY DATA:  Pertinent for an initial hemoglobin of 8 which dropped to 6.  PT of 15.4, BUN was 44, creatinine of 0.9.  Liver tests are normal.  Albumin 2.4.  ASSESSMENT: 1. Multiple medical problems. 2. Upper gastrointestinal bleeding with increased BUN and melena in a     patient on nonsteroidals.  PLAN:  We will proceed with endoscopy a.s.a.p. with further workup and plans.  Please see that dictation.  Care with aspirin and nonsteroidals in the future.          ______________________________ Petra Kuba, M.D.     MEM/MEDQ  D:  02/19/2011  T:  02/19/2011  Job:  [id number]  cc:   [name], M.D. [name],  M.D.  Electronically Signed by Vida Rigger M.D. on 03/17/2011 02:57:22 PM

## 2011-03-17 NOTE — Op Note (Signed)
NAMEMarland Cook  ANNE, SEBRING NO.:  [id number]  MEDICAL RECORD NO.:  [id number]  LOCATION:  2116                         FACILITY:  [facility]  PHYSICIAN:  Petra Kuba, M.D.    DATE OF BIRTH:  [birth year]  DATE OF PROCEDURE:  02/19/2011 DATE OF DISCHARGE:                              OPERATIVE REPORT   SURGEON:  Petra Kuba, MD  PROCEDURE:  EGD.  INDICATIONS:  GI bleed.  Probably upper consent was signed based on medical necessity with the ICU team, the surgeons and myself as well as the hospitalist in favor of the endoscopy.  He is currently unstable and intubated based on hemodynamic instability.  MEDICINES USED:  Customary sedation with his intubation fentanyl and Versed.  PROCEDURE:  The videoendoscope was inserted by direct vision.  The esophagus was normal.  The scope passed into the stomach and lots of old blood and clots were seen.  We did advance to the antrum, which was normal through a normal pylorus into the duodenal bulb where some blood and clots were seen.  We were able to advance to the second portion of the duodenum and a quick look at the ampulla was normal.  There was no active bleeding seen distally.  When we withdrew back to the bulb, there seemed to be one clot that was stuck to the wall and with more washing and suctioning, we were able to see the deep ulcer about 1 cm x 2 cm underneath it.  Lots of washing and suctioning were done.  There was no active bleeding.  We could not dislodge the ulcer.  Scope was withdrawn back to the stomach, which was evaluated on strained retroflex visualization and we could not wash and suction all the blood and clots from the proximal part of the stomach and along the proximal greater curve, but no additional abnormalities were seen and no other at-risk lesions.  We then reinserted into the bulb and again more washing and suctioning were done, but after a prolonged effort, we looked at the ulcer with the  surgical team and the ICU team, we elected not to snare the clot off and proceed with watchful waiting and let the Surgery decide whether they showed take him now while he is stable or whether we should wait for further bleeding.  Scope was withdrawn back to the stomach.  Air and water were suctioned.  No additional lesions were seen, but again the proximal part was not well evaluated.  Again, the esophagus was normal.  Scope was removed.  The patient tolerated the procedure well.  There was no obvious immediate complication.  ENDOSCOPIC DIAGNOSES: 1. Old clots in the stomach, mostly proximal. 2. Large deep ulcer with clots in the duodenal bulb, unable to wash     off. 3. No active bleeding seen.  PLAN:  Per ICU team and surgical team, in the meantime, continue Protonix drip as you are doing, no aspirin or nonsteroidals long-term, and repeat endoscopy p.r.n.  We will follow with you.          ______________________________ Petra Kuba, M.D.     MEM/MEDQ  D:  02/19/2011  T:  02/19/2011  Job:  [id number]  cc:   [name], M.D. [name], M.D.  Electronically Signed by Vida Rigger M.D. on 03/17/2011 02:57:13 PM

## 2011-03-22 ENCOUNTER — Encounter (INDEPENDENT_AMBULATORY_CARE_PROVIDER_SITE_OTHER): Payer: Medicaid Other | Admitting: General Surgery

## 2011-03-22 ENCOUNTER — Encounter (INDEPENDENT_AMBULATORY_CARE_PROVIDER_SITE_OTHER): Payer: Self-pay | Admitting: General Surgery

## 2011-04-01 NOTE — Discharge Summary (Signed)
NAMEMarland Cook  [name], [name]:  [id number]  MEDICAL RECORD NO.:  [id number]  LOCATION:  5008                         FACILITY:  [facility]  PHYSICIAN:  Ramiro Harvest, MD    DATE OF BIRTH:  1950-12-06  DATE OF ADMISSION:  03/07/2011 DATE OF DISCHARGE:  03/15/2011                              DISCHARGE SUMMARY   GASTROENTEROLOGIST:  [name], M.D. of [name]  PRIMARY CARE PHYSICIANS: 1. [name], M.D. 2. [name], M.D.  DISCHARGE DIAGNOSES: 1. 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 on March 07, 2011.     Also the patient is status post embolization of gastric duodenal     artery on March 09, 2011 per Dr. Bonnielee Haff of Interventional     Radiology.  The patient is status post 10 units of fresh frozen    plasma, 1 unit of platelets, and 15 units of packed red blood     cells. 2. Hemorrhagic shock secondary to problem #1, resolved. 3. Acute blood loss anemia secondary to #1. 4. 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 5. Hypertension. 6. Prior upper gastrointestinal bleed secondary to duodenal ulcer     secondary to diclofenac use status post expiratory laparotomy with     19 units of packed red blood cell on February 21, 2011. 7. History of progressive neurological disorder, poorly defined     familiar with progressive debilitation. 8. History of methicillin-resistant Staphylococcus aureus cellulitis     of the left upper extremity status post I and D and antibiotic     treatment. 9. Debility. 10.Benign prostatic hypertrophy.  DISCHARGE MEDICATIONS: 1. Docusate 250 mg p.o. b.i.d. 2. Flonase nasal spray 50 mcg one spray in each nostril daily as     needed. 3. Lisinopril 10 mg p.o. daily. 4. Percocet 5/325 1-2  tablets p.o. q.4 h. p.r.n. 5. Protonix 40 mg p.o. b.i.d.  DISPOSITION AND FOLLOWUP:  The patient will be discharged to a skilled nursing facility.  The patient is to follow up with [name] in 2 weeks to follow up for his exploratory laparotomy and oversew of his duodenal ulcer/gastroduodenal artery.  The patient will need a CBC on followup to follow up on his H and H.  The patient was discharged on wet-to-dry dressing changes.  The patient will be discharged on a full liquid diet and may advance slowly as tolerated to a mechanical soft bland diet. The patient is also to followup with PCP at the nursing home 1 week post discharge and a CBC will need to be checked to follow up on the patient's H and H.  CONSULTATIONS DONE:  CCM consultation was done.  The patient was seen in consultation by [name] on March 07, 2011.  The patient was consulted upon by [name] of [name] on March 07, 2011.  A General Surgery consultation was obtained.  The patient was seen in consultation by [name] on March 07, 2011.  PROCEDURES PERFORMED: 1. The  patient have a upper endoscopy done on March 07, 2011 per     [name] that showed a presumed bleed from the pyloric or     duodenal region.  No evidence of varices, Mallory-Weiss tear or     diffuse gastritis as a source of the bleeding.  The patient was     transfused 15 units of packed red blood cells.  The patient was     also transfused 10 units of FFP.  The patient was transfused 1 unit     of platelets. 2. The patient is status post exploratory laparotomy with duodenotomy     and oversew of the ulcer/gastric duodenal artery on March 07, 2011 per [name] of [facility].  The patient had     an embolization of the gastroduodenal artery done by Dr. Maryclare Bean     of Interventional Radiology on March 09, 2011 that showed no     source of bleeding was identified, gastroduodenal artery is     occluded  presumably from surgical ligation.  No intervention was     performed, both the celiac and SMA were injected.  BRIEF ADMISSION HISTORY AND PHYSICAL:  Mr. [name] is a 60 year old African American gentleman who was recently admitted to [facility] from February 19, 2011 through March 05, 2011 for massive GI bleed due to a duodenal ulcer from diclofenac use.  The patient ended up receiving 19 units of packed red blood cells at that time and had emergent exploratory laparotomy performed on February 19, 2011.  The patient had a prolonged vent and weaned that included being treated for vent-associated pneumonia.  The patient also had a left upper extremity abscess that was I and D during that stay.  The patient was subsequently discharged on March 05, 2011 in stable condition to a rehab facility off of all antibiotics with his abdominal wound healed via secondary intention.  However overnight, the staff noticed that the patient had a dark melanotic stool at the facility, sent him to the emergency room where he had another dark melanotic stool and was requested that he be admitted for further care.  The patient stated that he had some abdominal pain related to his incision, but had no nausea, no emesis, no fevers, no chills and had otherwise been feeling in his normal state of health except for the melena.  The patient denied any lightheadedness, no dizziness, or syncope.  No chest pain or shortness of breath.  For the rest of admission history and physical, please see H and P dictated by Dr. [name] of job number [id number].  HOSPITAL COURSE: 1. Massive upper GI bleed secondary to recurrent duodenal bulb     ulcer/gastroduodenal artery status post exploratory laparotomy with     duodenotomy and oversew of the ulcer/gastric duodenal artery,     status post embolization of the gastroduodenal artery, status post     15 units of packed red blood cells, 10 units of FFP, and 1 unit of      platelets.  The patient was admitted with an upper GI bleed,     initially two large-bore IVs were placed.  The patient was placed     on a Protonix drip, hydrated with IV fluids, and transferred to the     step-down unit.  The patient was monitored on the step-down unit.     The patient had serial H and H that were done and  he was followed.     The patient was initially transfused 2 units of packed red blood     cells.  He was monitored and followed.  However, on day of     admission, his hemoglobin dropped as low as 4.5.  The patient     became also hypotensive.  He was transfused 2 more units of packed     red blood cells.  A GI consultation was obtained.  The patient was     seen in consultation by [name] and the patient subsequently     underwent an emergent upper endoscopy on March 07, 2011, with     results as stated above and it was presumed that the patient's     bleed was from the pyloric or duodenal region.  [name] spoke     with general surgeon, Dr. Michaell Cowing on call and it was anticipated that     surgical management will likely be the next step.  The patient     continued to bleed and critical care consultation was subsequently     obtained.  The patient was seen in consultation by [name] of     Critical Care Medicine where he was monitored.  The patient was     subsequently seen by general surgeon, Dr. Michaell Cowing on March 07, 2011.  The patient was subsequently taken emergently to the     operating room where he underwent a exploratory laparotomy with     duodenotomy and oversew of the duodenal ulcer/gastric duodenal     artery per [name].  The patient was also been transfused at     that time.  The patient was postop.  He was intubated and sent to     the ICU intubated where he was managed by the critical care team.     The patient was noted to be in hemorrhagic shock.  He was initially     on pressors and was subsequently weaned off pressors.  He was      monitored and followed.  Initially he was placed empirically on IV     Invanz as he had some signs and symptoms of systemic inflammatory     response syndrome.  The patient was monitored and followed.  The     patient underwent a self extubation on March 09, 2011 and had     good respiratory status.  The patient did not need any further     intubation.  General Surgery had consulted with Interventional     Radiology and the patient subsequently underwent embolization of     the gastroduodenal artery on March 09, 2011 per Dr. Bonnielee Haff of     Interventional Radiology.  The patient was maintained in the ICU as     he was high risk.  He was subsequently monitored.  He is maintained     on a Protonix drip.  His H and H were followed.  The patient did     receive a total of 15 units of packed red blood cells during this     hospitalization, received a total of 10 units of fresh frozen     plasma and a unit of platelets as well.  The patient was     subsequently placed in the step-down unit where he was monitored     and followed.  It was followed per General Surgery.  The patient     continued to  remain in stable condition, his pressors had been     discontinued and he was followed.  He was subsequently transferred     to the floor where he was started on ice chips.  Diet was slowly     advanced to clear liquids which he tolerated.  The patient did not     have any further melanotic stools.  His diet was subsequently     advanced to a full liquid diet.  The patient will be discharged on     a full liquid diet with recommendations to advance slowly as     tolerated to a bland mechanical soft diet.  The patient was     subsequently followed up with [name] of [facility]     2 weeks post discharge.  I personally took care of the patient from     March 14, 2011 through March 15, 2011.  The patient     continued to remain in stable condition.  Per General Surgery, the      patient was currently in stable condition to be discharged to     skilled nursing facility.  The patient subsequently will be     discharged to the skilled nursing facility in stable condition. 2. Hemorrhagic shock secondary to problem #1, see problem #1. 3. Acute blood loss anemia secondary to problem #1.  The rest of the     patient's chronic medical issues have remained stable throughout     the hospitalization and the patient will be discharged in stable     and improved condition.  On the day of discharge, vital signs, temperature 98.2, pulse of 82, respirations 18, blood pressure 142/76, satting 100% on room air.  DISCHARGE LABS:  CBC, white count 7.9, hemoglobin 12.9, hematocrit 39.2, and a platelet count of 204.  It has been a pleasure taking care of Mr. Kalei Mckillop.     Ramiro Harvest, MD     DT/MEDQ  D:  03/15/2011  T:  03/15/2011  Job:  [id number]  cc:   [name], M.D. Ollen Gross. Vernell Morgans, M.D. Sandria Bales. Ezzard Standing, M.D. Coralyn Helling, MD [name], M.D. [name], M.D. Art A. Hoss, M.D.  Electronically Signed by Ramiro Harvest MD on 04/01/2011 12:29:58 PM

## 2011-04-07 ENCOUNTER — Encounter (INDEPENDENT_AMBULATORY_CARE_PROVIDER_SITE_OTHER): Payer: Self-pay | Admitting: General Surgery

## 2011-04-07 ENCOUNTER — Ambulatory Visit (INDEPENDENT_AMBULATORY_CARE_PROVIDER_SITE_OTHER): Payer: Medicaid Other | Admitting: Surgery

## 2011-04-07 VITALS — BP 142/92 | HR 100 | Temp 100.5°F | Resp 26 | Ht 78.0 in | Wt 145.0 lb

## 2011-04-07 DIAGNOSIS — Z09 Encounter for follow-up examination after completed treatment for conditions other than malignant neoplasm: Secondary | ICD-10-CM

## 2011-04-07 DIAGNOSIS — T8140XA Infection following a procedure, unspecified, initial encounter: Secondary | ICD-10-CM

## 2011-04-07 DIAGNOSIS — T8149XA Infection following a procedure, other surgical site, initial encounter: Secondary | ICD-10-CM

## 2011-04-07 NOTE — Discharge Summary (Signed)
NAMEMarland Kitchen  RHYLAND, HINDERLITER NO.:  [id number]  MEDICAL RECORD NO.:  [id number]  LOCATION:  5503                         FACILITY:  [facility]  PHYSICIAN:  Baltazar Najjar, MD     DATE OF BIRTH:  [birth year]  DATE OF ADMISSION:  02/19/2011 DATE OF DISCHARGE:  03/05/2011                        DISCHARGE SUMMARY - REFERRING   PRIMARY CARE PHYSICIAN:  [name], MD, as well as [name], MD, at his skilled nursing facility.  DISCHARGE DIAGNOSES: 1. Acute blood loss anemia, status post transfusion of 19 units of red     blood cells, 3 units of platelets, and 9 units of fresh frozen     plasma. 2. Gastrointestinal bleeding secondary to duodenal ulcer. 3. Hemorrhagic shock, secondary to gastrointestinal bleed. 4. Status post exploratory laparotomy and pyloroplasty. 5. Oversewing of duodenal ulcer. 6. Acute respiratory failure secondary to gastrointestinal bleeding,     hemorrhagic shock complicated by ventilator-associated pneumonia. 7. 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 8. Cellulitis of the left forearm.  Cultures indicate methicillin-     resistant Staphylococcus aureus. 9. Hypertension. 10.Debility.  CONSULTATIONS:  Dr. Vida Rigger, [facility]enter[facility]   Dr. Chevis Pretty, [facility]  PROCEDURES: 1. Upper endoscopy on February 19, 2011, performed by Dr. Vida Rigger. 2. Endotracheal tube placed on February 19, 2011, and removed on February 26, 2011. 3. Right internal jugular triple-lumen catheter placed on February 19, 2011, and removed on February 24, 2011, 4. Right peripherally inserted central catheter line placed on February 24, 2011. 5. Left brachial A-line placed on hospital admit day and removed on     February 23, 2011. 6. Exploratory laparotomy with duodenal ulcer oversew and pyloroplasty     performed on February 19, 2011, by Dr. Chevis Pretty. 7. Closure of the abdomen on February 21, 2011,  performed by Dr. Chevis Pretty.  DISCHARGE CONDITION:  The patient is stable.  He is anxious to get out of bed on his own, however, he is a fall risk.  He continues to have abdominal pain, however, this is improved with pain medications.  He is eating small amounts and needs to be encouraged to eat.  Otherwise, he is improving well.  HISTORY AND BRIEF HOSPITAL COURSE:  This is a 60 year old male who resides at skilled nursing facility secondary to poorly defined neuromuscular illness resulting in debilitation.  He was transferred to the hospital on February 19, 2011, after having a syncopal event with melena.  He developed hemorrhagic shock and underwent emergency upper endoscopy.  At the time of endoscopy, the ulcer was not bleeding and conservative management was chosen, however, later that afternoon he developed rebleeding of his duodenal ulcer necessitating large amounts of packed red blood cells.    He was taken emergently to the operating room on the 24th for exploratory laparotomy. He was admitted to the Critical Care Service for hemorrhagic shock. After surgery, the patient was placed on central venous TNA until he was extubated and it was appropriate for him to start eating.  At this point, he is weaned from  TNA and is eating small amounts, however, needs encouragement to eat.  General Surgery followed the patient throughout his hospitalization and will see him in followup in approximately 2 weeks.  Respiratory failure secondary to acute upper gastrointestinal bleed. Mr. Salce was intubated prophylactically in preparation for his procedures.  Unfortunately, he developed progressive pulmonary infiltrates.  These were thought to be ventilator-associated pneumonia and they were treated with vancomycin and Zosyn.  His last followup chest x-ray was on March 01, 2011.  The patient had mild basilar atelectasis with no pulmonary edema.  After a 10-day course of vancomycin and  Zosyn, his infiltrates cleared.  Chronic debilitation.  Mr. Follett resides in a skilled nursing facility. He is a moderate 2-person assist at this point.  After surgery, his potential for rehab is felt to be fair to good.  Mr. Mcadory does not have insight into his debility.  Consequently, despite being a high fall risk, he attempts to get up on his own to walk or to go to the bathroom. He is being discharged to skilled nursing facility for rehabilitation.  MRSA cellulitis of the left upper extremity.  Prior to admission, Mr. College had had a recent left forearm abscess that was status post incision and drainage approximately 2 weeks prior to his hospitalization and he was placed on doxycycline.  On February 05, 2011, his abscess was cultured and grew moderate MRSA.  In the hospital, he was treated with a 10-day course of vancomycin and Zosyn, which was also being used to treat pulmonary infiltrates.  Currently, the patient's cellulitis is improved and he remains with a bandage on his left arm.  Hypertension.  The patient has a history of hypertension, however, was off his blood pressure medications for most of the hospitalization secondary to low blood pressure.  On March 02, 2011, we were able to reinstate his lisinopril 10 mg p.o. daily.  His blood pressure will need to be monitored going forward to see if any other blood pressure medications need to be added back in the future.  PHYSICAL EXAMINATION:  GENERAL:  The patient is alert and oriented.  He is in some pain this morning and he has received no pain medications overnight.  He is trying to get out of bed even though he is at fall risk.  He does not understand that he should not get out of bed alone. VITAL SIGNS:  Temperature 97.6, pulse 79, respirations 20, blood pressure 118/74, and O2 sats 100% on room air. HEENT:  Head is atraumatic and normocephalic.  Eyes are anicteric with pupils that are equal and round.  Nose shows no  nasal discharge or exterior lesions.  Mouth has moist mucous membranes. NECK:  Supple with midline trachea.  No JVD.  No lymphadenopathy. CHEST:  No accessory muscle use.  He has no wheezes or crackles to my exam. HEART:  Regular rate and rhythm.  No obvious murmurs, rubs, or gallops. ABDOMEN:  Thin, firm, and tender.  His bandage is clean and dry. EXTREMITIES:  No clubbing, cyanosis, or edema. PSYCHIATRIC:  The patient is alert and oriented.  His demeanor is variable, but this morning cooperative.  Grooming is moderate.  PERTINENT LABORATORY DATA:  This morning on March 05, 2011, white count 14.3, hemoglobin 11.0, hematocrit 33.6, and platelets 500. Protime 13.6 and INR 1.02.  Sodium 144, potassium 3.6, chloride 110, bicarb 26, glucose 97, BUN 11, creatinine 0.95, and serum calcium 9.1. The patient had a urinary analysis done on March 04, 2011,  that showed small leukocytes, negative nitrates with rare bacteria, 0-2 white blood cells per high-powered field.  PERTINENT RADIOLOGICAL EXAMS:  As mentioned, the patient's last chest x- ray was on March 01, 2011, that showed mild basilar atelectasis with no pulmonary edema.  He had an x-ray of his left elbow on February 25, 2011, to evaluate for osteomyelitis, none was found.  He did have soft tissue swelling about the proximal ulna with areas of lucency within the soft tissues that could be secondary to skin irregularity or ulcer.  No definite deep soft tissue gas.  No plain film evidence of osteomyelitis. 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  DISCHARGE MEDICATIONS: 1. Ensure 237 mL by mouth 3 times a day between meals. 2. Lisinopril 10 mg by mouth daily. 3. Oxycodone/acetaminophen 5/325 one to two tablets by mouth every 4     hours as needed for pain. 4. Protonix 40 mg 1 tablet by mouth twice daily. 5. Docusate 250 mg 1 tablet by mouth every 12 hours, hold for loose     stools. 6. Fluticasone nasal spray 1 spray in each  nostril daily.  DISCHARGE INSTRUCTIONS:  The patient is being discharged to skilled nursing facility.  ACTIVITY LEVEL:  He is a 2-person moderate assist.  Activity will be per Physical Therapy at the skilled nursing facility.  He is at fall risk.  DIET:  Mechanical soft.  The patient will need encouragement to eat and he is to have Ensure t.i.d. between meals.  FOLLOWUP APPOINTMENTS: 1. The patient will need to be seen by Dr. Chevis Pretty within 2 weeks     after discharge.  Please call [phone], [facility] to     make an appointment. 2. Primary care physician at the skilled nursing facility.  The     patient needs to be seen within 2-3 days.  SUGGESTIONS FOR OUTPATIENT FOLLOWUP: 1. The patient's white cell count has increased over the past several     days.  This is thought to be reactive to him starting to eat.     However, he is no longer on antibiotics and needs to have his CBC     monitored to ensure that his white cell count is not increasing. 2. Blood pressure medication during this hospitalization has been     reduced.  It is likely that as the patient's health improves his     blood pressure will increase and his blood pressure medications     will need to be adjusted appropriately. 3. The patient is on Protonix b.i.d. after approximately 30 days.     This should be reduced to Protonix 40 mg daily as the patient is     not to receive NSAIDs. 4. He will need wound care followup to both his left upper extremity     where he had a MRSA abscess that was I and D'ed in mid August as     well as his abdomen staples have been removed from his abdomen.     However, wound area needs to be cleansed and bandage needs to be     kept clean and dry.     Stephani Police, PA   ______________________________ Baltazar Najjar, MD    MLY/MEDQ  D:  03/05/2011  T:  03/05/2011  Job:  [id number]  cc:   [name], M.D. [name], M.D.  Electronically Signed by  Algis Downs PA on 03/11/2011 08:14:20 AM Electronically Signed by Hannah Beat MD on  04/07/2011 08:07:50 PM

## 2011-04-07 NOTE — Progress Notes (Signed)
Subjective:     Patient ID: [name], male   DOB: 04/13/1951, 60 y.o.   MRN: [id number]  HPI  This patient is status post exploratory laparotomy in September for a bleeding duodenal ulcer by [name]. He also had have interventional radiology did call and stop bleeding as well. He is now in a nursing facility. He presents today with increasing abdominal pain. He has had no nausea or vomiting and moving his bowels Review of Systems     Objective:   Physical Exam On physical examination he is a marked amount of erythema and edema of his abdominal wall of the wound and drain a moderate amount of purulence the fascia was intact and there was no evidence of fistula formation    Assessment:     Patient with postoperative wound infection    Plan:     I will start him on Bactrim DS one p.o. b.i.d. I'll also given Ambien he'll for sleep. He will also undergo wet-to-dry dressing changes with normal saline b.i.d. to the midline wound. I will see him back next week for recheck.

## 2011-04-10 NOTE — H&P (Signed)
NAMEBRAYAM, BOEKE NO.:  [id number]  MEDICAL RECORD NO.:  [id number]  LOCATION:  2102                         FACILITY:  [facility]  PHYSICIAN:  Seward Speck, MD        DATE OF BIRTH:  [birth year]  DATE OF ADMISSION:  03/07/2011 DATE OF DISCHARGE:                             HISTORY & PHYSICAL   CHIEF COMPLAINT.:  Melena.  HISTORY OF PRESENT ILLNESS:  The patient is a 60 year old male who was recently admitted to the hospital from August 24 through March 05, 2011, for a massive GI bleed due to a duodenal ulcer from diclofenac use.  He ended up receiving 19 units of packed red blood cells and had an emergent expiratory laparotomy performed on February 19, 2011.  He had a prolonged vent weaned that included being treated for ventilator- associated pneumonia.  He also had a left arm abscess that was I and D'd during that stay.  He was discharged on March 05, 2011, in stable condition to a rehab facility off all antibiotics with his abdominal wound healed via secondary intention.  However, overnight, the staff noticed he had a dark melanotic stool at the facility that send him into emergency room where had another dark melanotic stool and it was requested that he be  admitted for further care.  The patient states he has had some abdominal pain related to his incision but has had no nausea, no emesis, no fevers, chills, and has otherwise been feeling in his normal state of health except for the melena.  He denies any lightheadedness, dizziness or syncope.  Denies any chest pain, shortness of breath.  PAST MEDICAL HISTORY: 1. GI bleed due to a duodenal ulcer from diclofenac use as detailed     above, status post emergent ex-lap with 19 units of PRBC during his     recent admission from August 26 to March 05, 2011. 2. History of progressive neurologic disorder possibly MS but the     notes of this are unclear. 3. Hypertension. 4. BPH. 5. Left forearm abscess  I and D'd during his hospital admission in     August 2012. 6. Prolonged vent wean in August 2012, involving an episode of     ventilator-associated pneumonia status post 10 days of antibiotics     per the notes.  ALLERGIES:  No known drug allergies.  FAMILY HISTORY:  Reviewed and noncontributory.  SOCIAL HISTORY:  The patient currently is residing at the rehab facility.  Denies any alcohol use, used to use marijuana, used to smoke cigarettes.  Per the discharge summary from March 05, 2011, he is a 2 person moderate assist.  He has been on a mechanical soft diet with Ensure t.i.d. between meals.  MEDICATIONS:  Currently receiving: 1. Fruits t.i.d. between meals. 2. Lisinopril 10 mg once daily. 3. Protonix 40 mg twice a day. 4. Docusate 250 mg every 12 hours. 5. Fluticasone nasal spray 50 mcg in each nostril once daily. 6. Percocet 5/325 one two tablets every 4 hours as needed for pain     especially pain due to his abdominal incision.  CODE STATUS:  Full code.  PHYSICAL EXAMINATION:  VITAL  SIGNS:  Afebrile, blood pressure 130/75, pulse 96, respiratory rate 16, oxygen saturation 96% on room air. GENERAL/CONSTITUTIONAL:  A well-developed male in no acute distress. HEENT:  Moist mucous membranes. NECK:  Supple.  No JVD. CARDIOVASCULAR:  Regular rate and rhythm.  No murmurs. PULMONARY:  Clear to auscultation bilaterally. ABDOMEN:  Soft, tender to palpation around his midline incision which is healing by secondary intention.  The incision has no surrounding erythema.  There is no fluctuance.  There is no warmth.  There is nothing to suggest that it is infected and actually has nice healing granulation tissue in the bed of the wound. EXTREMITIES:  Warm and well-perfused.  No cyanosis, clubbing or edema x4.  2+ pulses throughout. PSYCH:  Calm. SKIN:  No rash.  His left forearm IV site was dressed and I did not inspect that at the current time. NEUROLOGIC:  The patient's mental  processing seems a bit slow but he is able to answer most questions appropriately.  He has normal strength throughout.  His motor movements seems to be a bit sluggish.  DIAGNOSTIC STUDIES:  Acute abdomen series is currently pending.  CBC revealed hemoglobin of 9, it was 11.0 on March 05, 2011, white count 17.1, with a predominance of neutrophils was 14.3 on March 05, 2011. Of note, at the end of his hospital stay they have been in the mid teens, platelet count 517 which is stable.  Coags were normal.  BMP showed a BUN of 28 and creatinine of 1.0, glucose of 110, otherwise normal.  ASSESSMENT: 1. Melena/gastrointestinal bleed. 2. Anemia, acute blood loss. 3. History of peptic ulcer disease with recent duodenal ulcer status     post exploratory laparotomy and duodenal oversew and a patch     oversew on February 21, 2011. 4. History of hypertension. 5. History of progressive neurologic disorder that has not been well     defined. 6. Abdominal wound healing by secondary intention from his recent     exploratory laparotomy. 7. Recent left arm abscess status post incision and drainage. 8. Leukocytosis. 9. Recent ventilator-associated pneumonia during his last     hospitalization.  PLAN:  The patient will be transfused 2 units of packed red blood cells. He will have CBCs with the next one being at noon on March 07, 2011. He will receive IV fluids and be kept n.p.o.  He will have 2 large bore IVs placed for access.  We will receive an IV push of Nexium followed by a continuous drip.  We will hold his antihypertensives. We will give him IV morphine for pain control.  We will consult wound care for his abdominal incision.  He will likely need an EGD hopefully on Monday to evaluate if this is a recurrence of his ulcer.  We are going to do DVT prophylaxis with SCDs due to his acute bleeding.  He will be full code per his request.          ______________________________ Seward Speck, MD     DP/MEDQ  D:  03/07/2011  T:  03/07/2011  Job:  [id number]  Electronically Signed by Seward Speck MD on 04/10/2011 06:51:33 PM

## 2011-04-12 ENCOUNTER — Ambulatory Visit (INDEPENDENT_AMBULATORY_CARE_PROVIDER_SITE_OTHER): Payer: Medicaid Other | Admitting: Surgery

## 2011-04-12 ENCOUNTER — Encounter (INDEPENDENT_AMBULATORY_CARE_PROVIDER_SITE_OTHER): Payer: Self-pay | Admitting: Surgery

## 2011-04-12 VITALS — BP 126/88 | HR 70 | Temp 96.6°F | Resp 14 | Ht 78.0 in | Wt 145.0 lb

## 2011-04-12 DIAGNOSIS — Z09 Encounter for follow-up examination after completed treatment for conditions other than malignant neoplasm: Secondary | ICD-10-CM

## 2011-04-12 LAB — URINE CULTURE: Culture: NO GROWTH

## 2011-04-12 LAB — DIFFERENTIAL
Lymphocytes Relative: 25
Lymphs Abs: 1.5
Monocytes Absolute: 0.6
Monocytes Relative: 9
Neutro Abs: 4.1
Neutrophils Relative %: 65

## 2011-04-12 LAB — URINALYSIS, ROUTINE W REFLEX MICROSCOPIC
Glucose, UA: NEGATIVE
Hgb urine dipstick: NEGATIVE
Protein, ur: NEGATIVE
Specific Gravity, Urine: 1.01

## 2011-04-12 LAB — CBC
HCT: 35.7 — ABNORMAL LOW
Hemoglobin: 12.2 — ABNORMAL LOW
MCHC: 34.1
RDW: 13.9

## 2011-04-12 LAB — COMPREHENSIVE METABOLIC PANEL
Albumin: 3.5
BUN: 6
Calcium: 9.3
Creatinine, Ser: 0.77
Glucose, Bld: 91
Potassium: 3.8
Total Protein: 7.8

## 2011-04-12 NOTE — Progress Notes (Signed)
Subjective:     Patient ID: [name], male   DOB: 1950/10/08, 60 y.o.   MRN: [id number]  HPI  He is here for a followup wound check after I opened up an infected incision last week. He feels much better today and has less discomfort Review of Systems     Objective:   Physical Exam On examination, the wound is clean. Most of the abdominal wall and edema and erythema has regressed.    Assessment:     Postop wound infection    Plan:     He will continue his wound care and antibiotics. I will have him come back and see [name] in one to 2 weeks

## 2011-04-13 LAB — DIFFERENTIAL
Lymphocytes Relative: 40
Lymphs Abs: 1.7
Monocytes Relative: 10
Neutro Abs: 2
Neutrophils Relative %: 47

## 2011-04-13 LAB — CBC
HCT: 31.3 — ABNORMAL LOW
Hemoglobin: 11.4 — ABNORMAL LOW
Hemoglobin: 15.9
MCHC: 34.5
MCHC: 34.6
MCHC: 34.9
MCV: 95.6
MCV: 95.8
MCV: 96.6
Platelets: 194
Platelets: 215
RBC: 4 — ABNORMAL LOW
RBC: 4.8
RDW: 12.3
RDW: 12.4
RDW: 12.5
RDW: 12.8

## 2011-04-13 LAB — URINALYSIS, ROUTINE W REFLEX MICROSCOPIC
Bilirubin Urine: NEGATIVE
Hgb urine dipstick: NEGATIVE
Ketones, ur: NEGATIVE
Nitrite: NEGATIVE
Specific Gravity, Urine: 1.017
Urobilinogen, UA: 1
pH: 6

## 2011-04-13 LAB — COMPREHENSIVE METABOLIC PANEL
CO2: 28
Calcium: 9.2
Creatinine, Ser: 0.89
GFR calc non Af Amer: >60
Glucose, Bld: 103 — ABNORMAL HIGH
Sodium: 137
Total Protein: 7.5

## 2011-04-13 LAB — APTT: aPTT: 31

## 2011-04-13 LAB — TYPE AND SCREEN
ABO/RH(D): O POS
Antibody Screen: NEGATIVE

## 2011-04-13 LAB — PROTIME-INR
INR: 1
INR: 1.4
Prothrombin Time: 13.5
Prothrombin Time: 13.9
Prothrombin Time: 19.4 — ABNORMAL HIGH
Prothrombin Time: 22.9 — ABNORMAL HIGH

## 2011-04-15 ENCOUNTER — Encounter (INDEPENDENT_AMBULATORY_CARE_PROVIDER_SITE_OTHER): Payer: Self-pay | Admitting: General Surgery

## 2011-04-19 ENCOUNTER — Encounter (INDEPENDENT_AMBULATORY_CARE_PROVIDER_SITE_OTHER): Payer: Self-pay

## 2011-04-21 ENCOUNTER — Encounter (INDEPENDENT_AMBULATORY_CARE_PROVIDER_SITE_OTHER): Payer: Self-pay | Admitting: Surgery

## 2011-04-21 ENCOUNTER — Ambulatory Visit (INDEPENDENT_AMBULATORY_CARE_PROVIDER_SITE_OTHER): Payer: Medicaid Other | Admitting: Surgery

## 2011-04-21 VITALS — BP 98/68 | HR 80 | Temp 97.6°F | Resp 16 | Ht 78.0 in | Wt 144.0 lb

## 2011-04-21 DIAGNOSIS — Z8719 Personal history of other diseases of the digestive system: Secondary | ICD-10-CM

## 2011-04-21 NOTE — Patient Instructions (Signed)
Continue twice per day dressing changes until wound healed.  Follow up with gastroenterologist ([name] saw him at [facility]).

## 2011-04-21 NOTE — Progress Notes (Addendum)
ASSESSMENT AND PLAN: 1.  Recurrent duodenal ulcer bleed.  Oversew of duodenal ulcer 02/19/2011 by [name].  Repeat operation with duodenotomy/oversew of gastroduodenal artery 03/08/2011 by Dr. Algis Downs. Notnamed Scholz.  [name] was gastroenterologist at hospital.  I would consider followup with [name] in 3 months from date of surgery.  One question would be whether are repeat upper endo would direct therapy.  2.  Wound infection.  Last seen by Dr. Magnus Ivan 04/12/2011.  The wound is much cleaner today and almost healed.  To continue twice/day dressing changes.  Can follow up with our office on a PRN basis.  3.  Hypertension. 4.  Progressive neurologic disorder.  (MS is in his chart, but the patient denies this.) 5.  History of MRSA. 6.  BPH. 7.  Quit smoking this summer.  HISTORY OF PRESENT ILLNESS: Chief Complaint  Patient presents with  . Routine Post Op    PO from infection in incision    [name] is a 60 y.o. (DOB: 1951/05/12)  AA male who is a patient of MCCLUNG,JEFFREY T, MD and comes to me today for wound care and followup of surgery for bleeding duodenal ulcer.  Mr. Beedle was hospitalized from 03/07/2011 - 03/15/2011 for a second (repeat) UGI bleed secondary to a duodenal ulcer.  [name] did the original surgery on 02/19/2011, I did the reoperation on 03/07/2011.  He saw Dr. Magnus Ivan last on 04/12/2011 for wound problems.  He is now at [place] [facility] [place]  He is accompanied by an aide.  He is in a wheelchair, unable to get out of the chair without assistance.  He has some abdominal discomfort, but is doing well.  His appetite is improving.  He is having no N/V.  PHYSICAL EXAM: BP 98/68  Pulse 80  Temp(Src) 97.6 F (36.4 C) (Temporal)  Resp 16  Ht 6\' 6"  (1.981 m)  Wt 144 lb (65.318 kg)  BMI 16.64 kg/m2  Abdomen:  Wound in mid line almost healed.  Bowel sounds present.  No localized tenderness.  DATA REVIEWED: Hospital record. On 04/24/2011 - WBC- 7,700, Hgb -  13.6.  [A copy of this note was given to the patient to return to [place].  DN]

## 2011-06-07 ENCOUNTER — Encounter (HOSPITAL_COMMUNITY): Payer: Self-pay

## 2011-06-07 ENCOUNTER — Emergency Department (HOSPITAL_COMMUNITY): Payer: Medicaid Other

## 2011-06-07 ENCOUNTER — Emergency Department (HOSPITAL_COMMUNITY)
Admission: EM | Admit: 2011-06-07 | Discharge: 2011-06-07 | Disposition: A | Discharge status: Home / Self Care | Payer: Medicaid Other | Attending: Emergency Medicine | Admitting: Emergency Medicine

## 2011-06-07 DIAGNOSIS — L0390 Cellulitis, unspecified: Secondary | ICD-10-CM

## 2011-06-07 DIAGNOSIS — R109 Unspecified abdominal pain: Secondary | ICD-10-CM | POA: Insufficient documentation

## 2011-06-07 DIAGNOSIS — I10 Essential (primary) hypertension: Secondary | ICD-10-CM | POA: Insufficient documentation

## 2011-06-07 DIAGNOSIS — Z9889 Other specified postprocedural states: Secondary | ICD-10-CM | POA: Insufficient documentation

## 2011-06-07 DIAGNOSIS — L0291 Cutaneous abscess, unspecified: Secondary | ICD-10-CM | POA: Insufficient documentation

## 2011-06-07 LAB — POCT I-STAT, CHEM 8
BUN: 12 mg/dL (ref 6–23)
Calcium, Ion: 1.26 mmol/L (ref 1.12–1.32)
Creatinine, Ser: 0.9 mg/dL (ref 0.50–1.35)
Glucose, Bld: 95 mg/dL (ref 70–99)
TCO2: 29 mmol/L (ref 0–100)

## 2011-06-07 LAB — CBC
HCT: 38.5 % — ABNORMAL LOW (ref 39.0–52.0)
Hemoglobin: 13 g/dL (ref 13.0–17.0)
MCH: 31.6 pg (ref 26.0–34.0)
MCV: 93.7 fL (ref 78.0–100.0)
Platelets: 242 10*3/uL (ref 150–400)
RBC: 4.11 MIL/uL — ABNORMAL LOW (ref 4.22–5.81)

## 2011-06-07 MED ORDER — DOXYCYCLINE HYCLATE 50 MG PO CAPS: 50.0000 mg | ORAL_CAPSULE | Freq: Two times a day (BID) | ORAL | Status: AC

## 2011-06-07 MED ORDER — OXYCODONE-ACETAMINOPHEN 5-325 MG PO TABS
1.0000 | ORAL_TABLET | ORAL | Status: DC | PRN
  Administered 2011-06-07 (×2): 1 via ORAL
  Filled 2011-06-07 (×2): qty 1

## 2011-06-07 MED ORDER — POLYETHYLENE GLYCOL 3350 17 G PO PACK: 17.0000 g | PACK | Freq: Two times a day (BID) | ORAL | Status: DC

## 2011-06-07 NOTE — ED Notes (Signed)
ZOX:[id number]<AV> Expected date:<BR> Expected time:<BR> Means of arrival:<BR> Comments:<BR> ems abd pain

## 2011-06-07 NOTE — ED Provider Notes (Signed)
History     CSN: [id number] Arrival date & time: 06/07/2011  7:38 AM   First MD Initiated Contact with Patient 06/07/11 0802      Chief Complaint  Patient presents with  . Abdominal Pain    (Consider location/radiation/quality/duration/timing/severity/associated sxs/prior treatment) HPI Patient is a 60 year old male with an extensive past medical history including hospitalization and surgery for massive GI bleed in August 2012 ([name]), complicated by rebleed and repeat operation in September 2012 ([name]), complicated by wound infection (Dr. Magnus Ivan).  GI doc was [facility]. Patient last seen by Dr. Magnus Ivan in clinic for followup of his midline abdominal wound infection, and was to follow up in the middle October. Patient now complains of a one day of pain and erythema over the wound admits to 2-3 weeks of slowly increasing abdominal pain. Patient is not a [employer] in, so the facts are not always clear. His last bowel movement was the day before yesterday, and he does complain of chronic constipation next pain medicine use.  Patient says he vomited yesterday, but denies dysuria, blood in his stool or melena, chest pain, or shortness of breath.    Past Medical History  Diagnosis Date  . Hypertension     Past Surgical History  Procedure Date  . Knee arthroscopy     right  . Total knee arthroplasty     right  . Wrist surgery     right  . Neck surgery   . Exploratory laparotomy 02/2011    Duodenotomy, oversew of gastric ulcer/duodenal artery  . Appendectomy     No family history on file.  History  Substance Use Topics  . Smoking status: Former Smoker -- 40 years    Quit date: 03/08/2011  . Smokeless tobacco: Not on file  . Alcohol Use: Yes     states he stopped 2 yrs ago      Review of Systems  Allergies  Review of patient's allergies indicates no known allergies.  Home Medications   Current Outpatient Rx  Name Route Sig Dispense Refill  .  BISACODYL 5 MG PO TBEC Oral Take 5 mg by mouth daily as needed.      Marland Kitchen FLUTICASONE PROPIONATE 50 MCG/ACT NA SUSP Nasal Place 2 sprays into the nose daily.      Marland Kitchen LISINOPRIL 10 MG PO TABS Oral Take 10 mg by mouth daily.      . MULTIVITAMINS PO CAPS Oral Take 1 capsule by mouth daily.      Marland Kitchen OMEPRAZOLE 20 MG PO CPDR Oral Take 20 mg by mouth daily.      . OXYCODONE-ACETAMINOPHEN 5-325 MG PO TABS Oral Take 1 tablet by mouth every 4 (four) hours as needed.      . DISPOSABLE ENEMA 19-7 GM/118ML RE ENEM Rectal Place 1 enema rectally as needed. follow package directions     . ZOLPIDEM TARTRATE 10 MG PO TABS Oral Take 10 mg by mouth at bedtime as needed.        BP 102/64  Pulse 73  Temp 97.9 F (36.6 C)  Resp 18  SpO2 98%  Physical Exam Physical Exam GEN: NAD.  Alert and oriented x 3.  Pleasant, conversant, and cooperative to exam. RESP:  CTAB, no w/r/r CARDIOVASCULAR: RRR, S1, S2, no m/r/g ABDOMEN: firm but softens with breathing. TTP over midline incision: well healed with some keloid, no exudates or open areas.  Surrounding erythema roughly three finger-widths on either side of wound with poorly demarcated margin.  EXT: warm and dry. No edema in b/l LE  ED Course  Procedures (including critical care time)   Labs Reviewed  I-STAT, CHEM 8  CBC   No results found.   No diagnosis found.    MDM  60 year old man status post laparotomy for duodenal ulcer in August of 2012, repeat surgery for rebleed in September 2012, and I&D of midline surgical wound infection in October 2012. Had been doing well in October, last seen by [name] at the end of October, doing well with good resolution of incision infection. Now, however, patient presents with possible cellulitis of the midline incision. He does not have any visible fistulas or drainage or exudates, but given his extensive abdominal surgical history, we will get a CBC, abdominal x-ray, and likely touch base with [facility].  1:40 PM Xray  unremarkable, CBC wnl.  Seen by [facility], who agree with PO abx for cellulitis and miralax for constipation.  Can f/u with [facility] as needed if infxn does not clear.  Ok to d/c back to [place].        Kathreen Cosier, MD 06/07/11 1341

## 2011-06-07 NOTE — Consult Note (Signed)
Reason for Consult:Abd pain, redness at incision Referring Physician: EDP  HPI: [name] is an 60 y.o. male who had ex lap on 2 occasions for bleeding duodenal ulcer in August and again in October. He's been followed as an outpt by [name], Dr. Magnus Ivan and [name], last seen about 6 weeks ago. He has had some intermittent constipation and the SNF where he lives will give him some enemas. His last BM was 2 days ago. He was brought to the ER by EMS at the request of the SNF for eval of abd pain and redness at incision line. He denies fever and states he's been eating well. He otherwise deneis nay other c/o.  Past Medical History:  Past Medical History  Diagnosis Date  . Hypertension     Surgical History:  Past Surgical History  Procedure Date  . Knee arthroscopy     right  . Total knee arthroplasty     right  . Wrist surgery     right  . Neck surgery   . Exploratory laparotomy 02/2011    Duodenotomy, oversew of gastric ulcer/duodenal artery  . Appendectomy     Family History: No family history on file.  Social History:  reports that he quit smoking about 2 months ago. He does not have any smokeless tobacco history on file. He reports that he drinks alcohol. He reports that he does not use illicit drugs.  Allergies: No Known Allergies  Medications: I have reviewed the patient's current medications.  ROS: See HPI for pertinent findings, otherwise complete 10 system review negative.  Physical Exam: Blood pressure 127/70, pulse 85, temperature 98.4 F (36.9 C), resp. rate 20, SpO2 98.00%.  General Appearance:  Alert, cooperative, no distress, appears stated age. The patient has mild slow mental capacity but answers appropriately  Lungs:   Clear to auscultation bilaterally, no w/r/r, respirations unlabored without use of accessory muscles.  Chest Wall:  No tenderness or deformity  Heart:  Regular rate and rhythm, S1, S2 normal, no murmur, rub or gallop. Carotids 2+  without bruit.  Abdomen:   Soft, non-tender, non distended. Bowel sounds active all four quadrants,  no masses, no organomegaly.Incision looks like it is healing well. No open areas, no fluctuance, mild erythema   Extremities: Extremities normal, atraumatic, no cyanosis or edema  Pulses: 2+ and symmetric  Skin: Skin color, texture, turgor normal, no rashes or lesions  Neurologic: Normal affect, no gross deficits.     Labs: CBC  Basename 06/07/11 0919 06/07/11 0905  WBC -- 6.7  HGB 13.3 13.0  HCT 39.0 38.5*  PLT -- 242   MET  Basename 06/07/11 0919  NA 141  K 4.2  CL 104  CO2 --  GLUCOSE 95  BUN 12  CREATININE 0.90  CALCIUM --   No results found for this basename: PROT,ALBUMIN,AST,ALT,ALKPHOS,BILITOT,BILIDIR,IBILI,LIPASE in the last 72 hours PT/INR No results found for this basename: LABPROT:2,INR:2 in the last 72 hours ABG No results found for this basename: PHART:2,PCO2:2,PO2:2,HCO3:2 in the last 72 hours    Dg Abd 2 Views  06/07/2011  *RADIOLOGY REPORT*  Clinical Data: Abdominal pain postop, leakage from surgery site  ABDOMEN - 2 VIEW  Comparison: CT scan 04/11/2010  Findings: There is nonspecific nonobstructive bowel gas pattern. Post cholecystectomy surgical clips are noted.  Degenerative changes and mild levoscoliosis lumbar spine.  Significant colonic stool.  No free abdominal air is noted. Gas noted within rectum.  IMPRESSION: Nonspecific nonobstructive bowel gas pattern.  Colonic stool  noted. Mild levoscoliosis and mild degenerative changes lumbar spine.  Original Report Authenticated By: Natasha Mead, M.D.    Assessment: Abdominal pain-likely secondary to constipation. Possible Mild cellulitis of midline incision.  Plan: Will begin daily Miralax and advise a full liquid diet. Short course of po abx Follow up recommended with [name]. Dusty Raczkowski J 06/07/2011, 12:52 PM

## 2011-06-07 NOTE — ED Notes (Signed)
Pt presents via EMS listening to ear buds..states his appendectomy form 6 wks ago is "infected"

## 2011-06-07 NOTE — ED Provider Notes (Signed)
I saw and evaluated the patient, reviewed the resident's note and I agree with the findings and plan.   Kyliegh Jester A. Patrica Duel, MD 06/07/11 [phone]

## 2011-06-09 ENCOUNTER — Encounter (INDEPENDENT_AMBULATORY_CARE_PROVIDER_SITE_OTHER): Payer: Self-pay

## 2011-08-27 ENCOUNTER — Telehealth (INDEPENDENT_AMBULATORY_CARE_PROVIDER_SITE_OTHER): Payer: Self-pay | Admitting: General Surgery

## 2011-08-27 NOTE — Telephone Encounter (Signed)
MR Nakama CALLED TO REQUEST AN APPOINTMENT WITH DR, Ezzard Standing FOR ABDOMINAL PAIN/ HE CALLED FROM [facility]/ I  ASKED IF THE MEDICAL DR. KNEW ABOUT HIS COMPLAINT AND HE WAS NOT CLEAR AS TO WHETHER THE DOCTOR KNEW. I TOLD HIM I WOULD CONTACT HIS NURSE AT THE FACILITY AND INFORM HER OF HIS COMPLAINT TO SEE IF HE COULD BE EVALUATED. PT SAID "HIS BOWELS ARE MOVING AND HE DOES HAVE AN APPETITE. HE DOES EXPERIENCE NAUSEA SEVERAL TIMES A WEEK. I LEFT A MESSAGE FOR NURSE (JENNIFER) TO CALL RE [facility]. [phone]/GY
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
## 2011-10-06 ENCOUNTER — Other Ambulatory Visit: Payer: Self-pay | Admitting: Gastroenterology

## 2011-10-16 IMAGING — RF DG TIBIA/FIBULA 2V*L*
1 series · 5 of 5 positions shown · non-contrast
Comparison: 08/28/2009

CLINICAL DATA: Left tib-fib fracture.

LEFT TIBIA AND FIBULA - 2 VIEW

[Series 1: run · 5 of 5 slices shown]
[im 1/5]
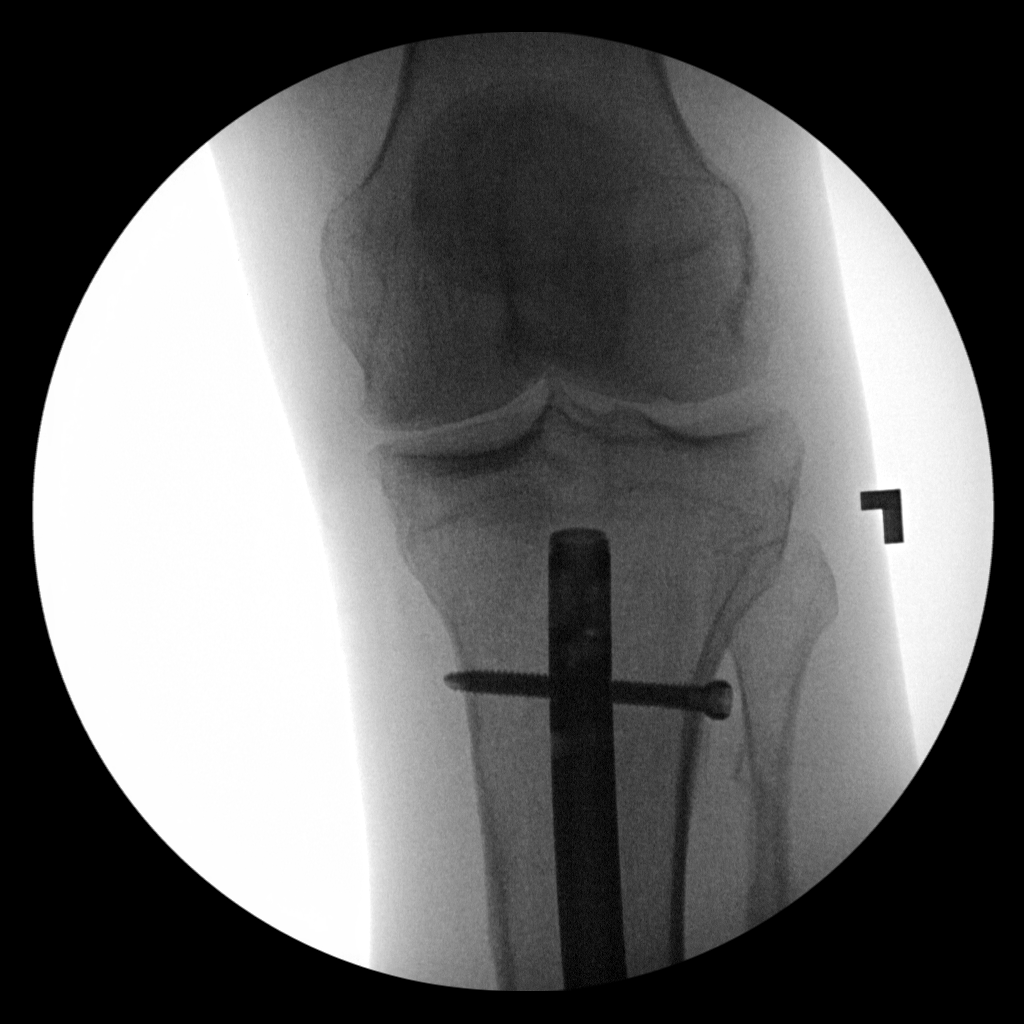
[im 2/5]
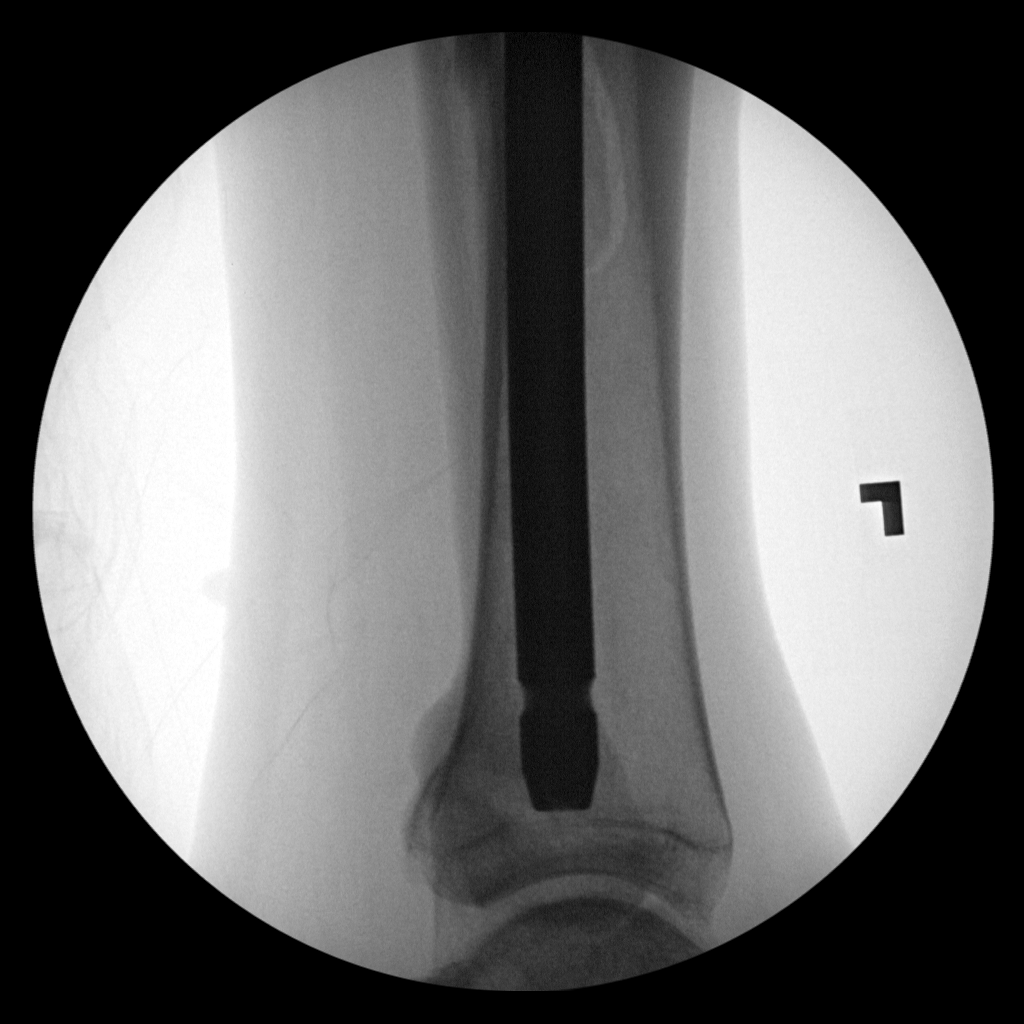
[im 3/5]
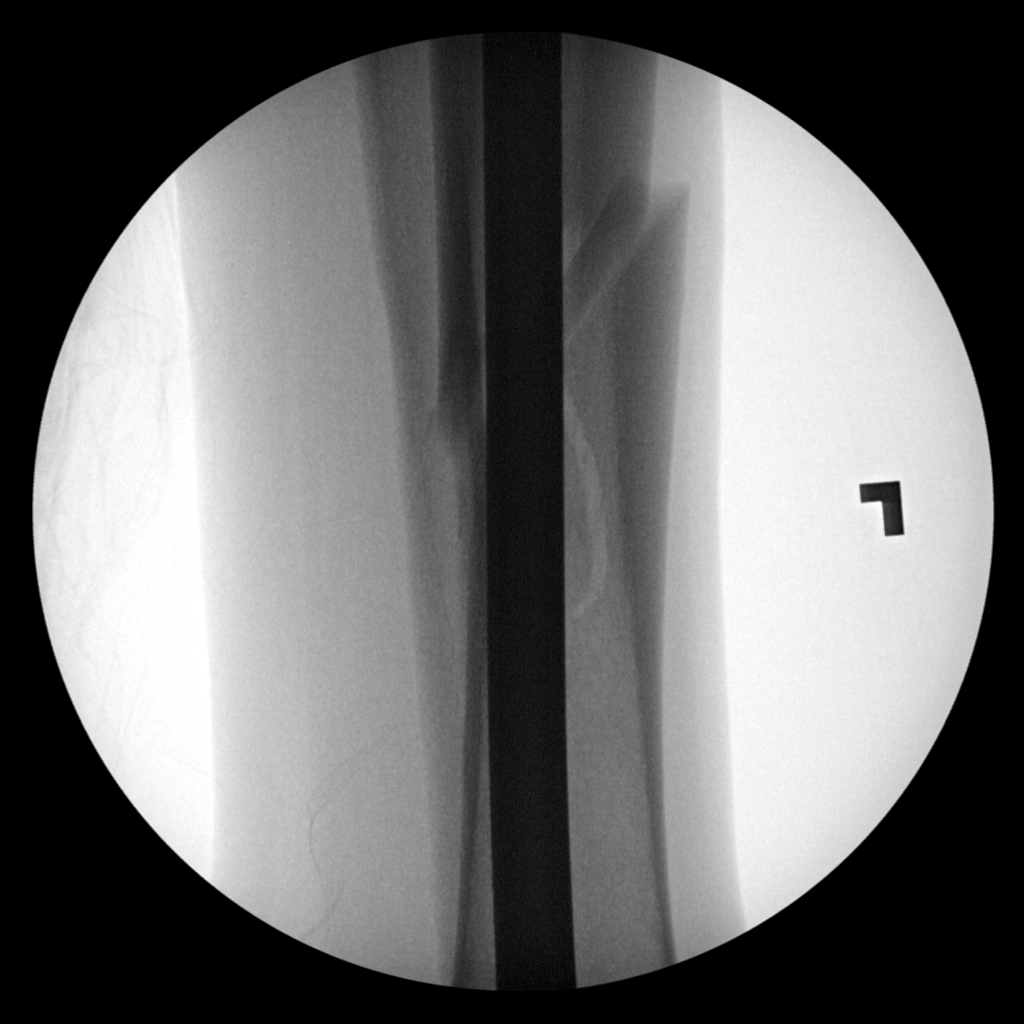
[im 4/5]
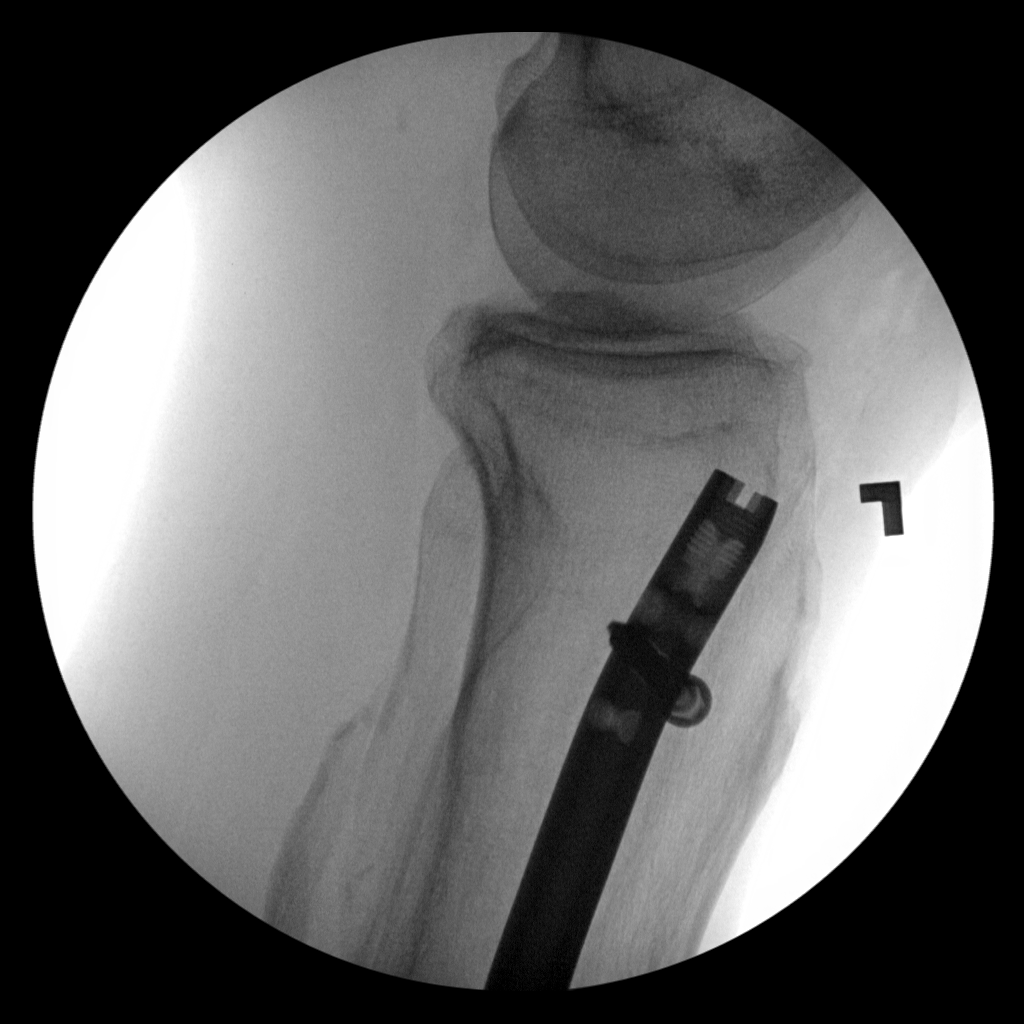
[im 5/5]
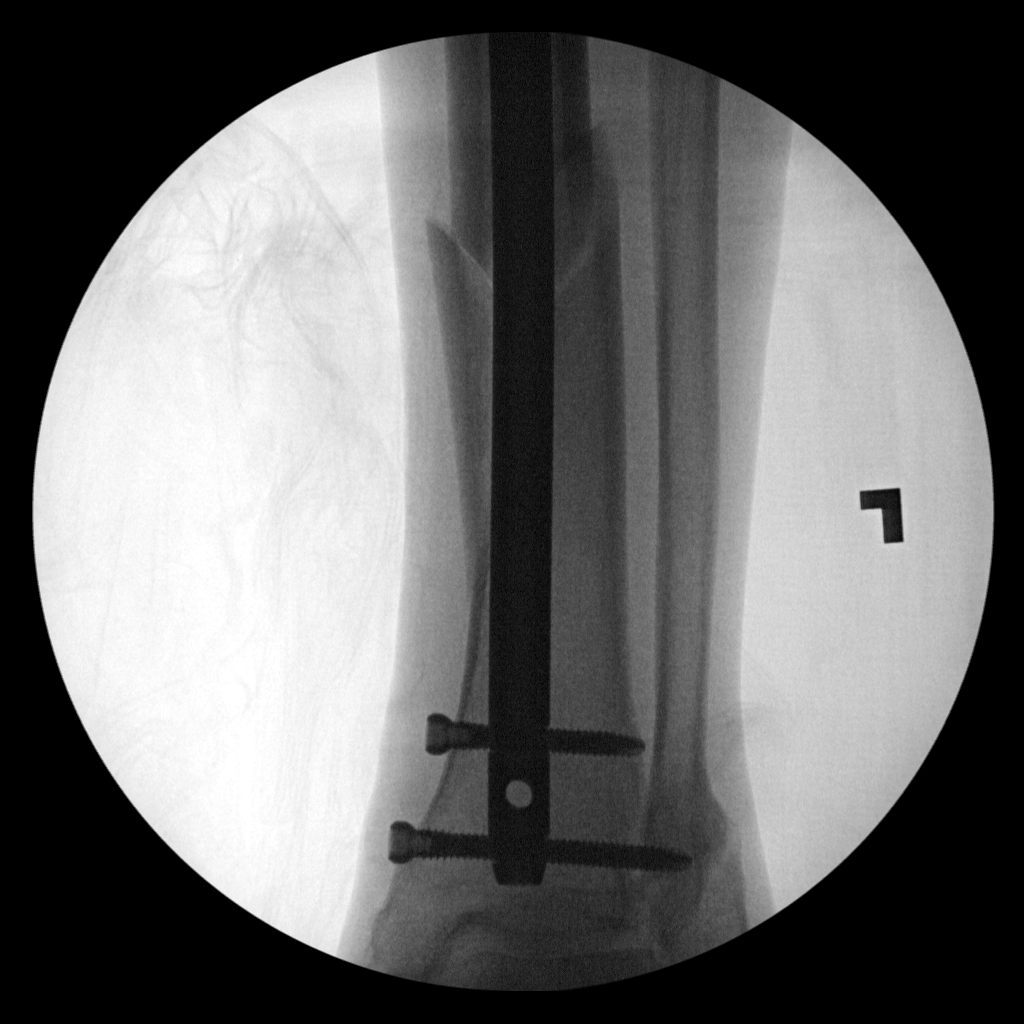

[5 of 5 positions shown; findings below may reference images not displayed]

FINDINGS: Intraoperative films are performed, showing
intramedullary rod across a comminuted tibial fracture.  Proximal
and distal cortical screws are present.  The fracture is in near
anatomic alignment.
IMPRESSION: Status post ORIF tibial fracture.

## 2011-11-17 ENCOUNTER — Ambulatory Visit (INDEPENDENT_AMBULATORY_CARE_PROVIDER_SITE_OTHER): Payer: Medicaid Other | Admitting: Surgery

## 2011-11-17 ENCOUNTER — Encounter (INDEPENDENT_AMBULATORY_CARE_PROVIDER_SITE_OTHER): Payer: Self-pay | Admitting: Surgery

## 2011-11-17 VITALS — BP 104/72 | HR 78 | Temp 97.2°F | Resp 16 | Ht 77.0 in | Wt 186.0 lb

## 2011-11-17 DIAGNOSIS — Z8719 Personal history of other diseases of the digestive system: Secondary | ICD-10-CM

## 2011-11-17 NOTE — Progress Notes (Addendum)
Name:  [name] MRN:  [id number] Date of Birth:  21-Sep-1950  ASSESSMENT AND PLAN: 1.  Recurrent duodenal ulcer bleed.  Oversew of duodenal ulcer 02/19/2011 by [name].  Repeat operation with duodenotomy/oversew of gastroduodenal artery 03/08/2011 by Dr. Algis Downs. Chellsea Beckers.  [name] has done an upper endo.  I do not have the report, but by the patient's account, the ulcer is healed.   Return to [place] is PRN.  2.  Wound infection.  Wound is healed. 2a.  Suture palpable through skin.  Removed at this visit.  3.  Hypertension. 4.  Progressive neurologic disorder.  (MS is in his chart, but the patient denies this.)  Comes in a wheel chair.  Patient difficult to understand.  He is still at [facility]. 5.  History of MRSA. 6.  BPH. 7.  Quit smoking this summer.  HISTORY OF PRESENT ILLNESS: Chief Complaint  Patient presents with  . Routine Post Op    Est Pt. Eval of Poss suture granuloma    [name] is a 61 y.o. (DOB: [birth year])  AA male who is a patient of MCCLUNG,JEFFREY T, MD, MD and comes to me today for follow-up.  Mr. Bala was hospitalized from 03/07/2011 - 03/15/2011 for a second (repeat) UGI bleed secondary to a duodenal ulcer.  [name] did the original surgery on 02/19/2011, I did the reoperation on 03/07/2011.  His wound has healed.  He has a palpable suture at the upper part of the incision that is bothering him.  He said that [name] has done an upper endo.  I do not have the report, but by the patient's account, the ulcer is healed. He is also scheduled for a colonoscopy 01/07/2012.  PHYSICAL EXAM: BP 104/72  Pulse 78  Temp(Src) 97.2 F (36.2 C) (Temporal)  Resp 16  Ht 6\' 5"  (1.956 m)  Wt 186 lb (84.369 kg)  BMI 22.06 kg/m2  Abdomen: Midline wound healed.  Suture palpable through skin.  PROCEDURE:  While in office.  I prepped the skin.  Infiltrated 2.5 cc of 1% xylocaine and removed a Novafil suture.  The wound was sterilely dressed.  DATA  REVIEWED: Dr. Annette Stable note.  [Copy of office note given to patient to take to [facility].]

## 2012-01-13 ENCOUNTER — Encounter (INDEPENDENT_AMBULATORY_CARE_PROVIDER_SITE_OTHER): Payer: Medicaid Other | Admitting: Surgery

## 2012-01-14 ENCOUNTER — Telehealth (INDEPENDENT_AMBULATORY_CARE_PROVIDER_SITE_OTHER): Payer: Self-pay

## 2012-01-14 NOTE — Telephone Encounter (Signed)
Message copied by Ivory Broad on Fri Jan 14, 2012  9:58 AM ------      Message from: Rise Paganini      Created: Thu Jan 13, 2012  4:05 PM      Regarding: [name]      Contact: [phone]       Please call the facility to schedule appointment. Please for [name] at [facility]. Thank you.

## 2012-01-14 NOTE — Telephone Encounter (Signed)
I returned [name]'s call from yesterday.  I was told it was regarding an appointment.  [name] was unavailable so a message was taken for him to call me back.  When I looked at the appointment history, there was originally an appointment scheduled for 07/18.  That got moved up sooner and we saw the pt in May.  He was released to come back prn.  We do not need to schedule another appointment unless there is an issue.

## 2012-04-25 ENCOUNTER — Encounter (HOSPITAL_COMMUNITY): Payer: Self-pay | Admitting: Emergency Medicine

## 2012-04-25 ENCOUNTER — Emergency Department (HOSPITAL_COMMUNITY)
Admission: EM | Admit: 2012-04-25 | Discharge: 2012-04-25 | Disposition: A | Discharge status: Home / Self Care | Payer: Medicaid Other | Attending: Emergency Medicine | Admitting: Emergency Medicine

## 2012-04-25 DIAGNOSIS — I10 Essential (primary) hypertension: Secondary | ICD-10-CM | POA: Insufficient documentation

## 2012-04-25 DIAGNOSIS — Z8709 Personal history of other diseases of the respiratory system: Secondary | ICD-10-CM | POA: Insufficient documentation

## 2012-04-25 DIAGNOSIS — IMO0002 Reserved for concepts with insufficient information to code with codable children: Secondary | ICD-10-CM

## 2012-04-25 DIAGNOSIS — Z8614 Personal history of Methicillin resistant Staphylococcus aureus infection: Secondary | ICD-10-CM | POA: Insufficient documentation

## 2012-04-25 DIAGNOSIS — F919 Conduct disorder, unspecified: Secondary | ICD-10-CM | POA: Insufficient documentation

## 2012-04-25 DIAGNOSIS — Z79899 Other long term (current) drug therapy: Secondary | ICD-10-CM | POA: Insufficient documentation

## 2012-04-25 DIAGNOSIS — Z8711 Personal history of peptic ulcer disease: Secondary | ICD-10-CM | POA: Insufficient documentation

## 2012-04-25 DIAGNOSIS — Z87891 Personal history of nicotine dependence: Secondary | ICD-10-CM | POA: Insufficient documentation

## 2012-04-25 DIAGNOSIS — Z87442 Personal history of urinary calculi: Secondary | ICD-10-CM | POA: Insufficient documentation

## 2012-04-25 HISTORY — DX: Cognitive communication deficit: R41.841

## 2012-04-25 HISTORY — DX: Benign prostatic hyperplasia without lower urinary tract symptoms: N40.0

## 2012-04-25 HISTORY — DX: Calculus of kidney: N20.0

## 2012-04-25 HISTORY — DX: Chronic or unspecified duodenal ulcer with hemorrhage: K26.4

## 2012-04-25 HISTORY — DX: Muscle weakness (generalized): M62.81

## 2012-04-25 HISTORY — DX: Acute respiratory failure, unspecified whether with hypoxia or hypercapnia: J96.00

## 2012-04-25 HISTORY — DX: Methicillin resistant Staphylococcus aureus infection, unspecified site: A49.02

## 2012-04-25 MED ORDER — HYDROCODONE-ACETAMINOPHEN 5-325 MG PO TABS
2.0000 | ORAL_TABLET | Freq: Once | ORAL | Status: AC
  Administered 2012-04-25: 2 via ORAL
  Filled 2012-04-25: qty 2
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
## 2012-04-25 NOTE — ED Notes (Signed)
telepsych faxed 

## 2012-04-25 NOTE — ED Notes (Signed)
MD at bedside. 

## 2012-04-25 NOTE — ED Notes (Signed)
Telepsych: no psychotic symptoms, clear for discharge

## 2012-05-13 ENCOUNTER — Emergency Department (HOSPITAL_COMMUNITY)
Admission: EM | Admit: 2012-05-13 | Discharge: 2012-05-13 | Disposition: A | Discharge status: Skilled Nursing Facility | Payer: Medicaid Other | Attending: Emergency Medicine | Admitting: Emergency Medicine

## 2012-05-13 ENCOUNTER — Emergency Department (HOSPITAL_COMMUNITY): Payer: Medicaid Other

## 2012-05-13 ENCOUNTER — Encounter (HOSPITAL_COMMUNITY): Payer: Self-pay | Admitting: Emergency Medicine

## 2012-05-13 DIAGNOSIS — W010XXA Fall on same level from slipping, tripping and stumbling without subsequent striking against object, initial encounter: Secondary | ICD-10-CM | POA: Insufficient documentation

## 2012-05-13 DIAGNOSIS — Z87442 Personal history of urinary calculi: Secondary | ICD-10-CM | POA: Insufficient documentation

## 2012-05-13 DIAGNOSIS — Z8709 Personal history of other diseases of the respiratory system: Secondary | ICD-10-CM | POA: Insufficient documentation

## 2012-05-13 DIAGNOSIS — N400 Enlarged prostate without lower urinary tract symptoms: Secondary | ICD-10-CM | POA: Insufficient documentation

## 2012-05-13 DIAGNOSIS — Y9210 Unspecified residential institution as the place of occurrence of the external cause: Secondary | ICD-10-CM | POA: Insufficient documentation

## 2012-05-13 DIAGNOSIS — W19XXXA Unspecified fall, initial encounter: Secondary | ICD-10-CM

## 2012-05-13 DIAGNOSIS — IMO0002 Reserved for concepts with insufficient information to code with codable children: Secondary | ICD-10-CM

## 2012-05-13 DIAGNOSIS — S01309A Unspecified open wound of unspecified ear, initial encounter: Secondary | ICD-10-CM | POA: Insufficient documentation

## 2012-05-13 DIAGNOSIS — Z87891 Personal history of nicotine dependence: Secondary | ICD-10-CM | POA: Insufficient documentation

## 2012-05-13 DIAGNOSIS — I10 Essential (primary) hypertension: Secondary | ICD-10-CM | POA: Insufficient documentation

## 2012-05-13 DIAGNOSIS — Y939 Activity, unspecified: Secondary | ICD-10-CM | POA: Insufficient documentation

## 2012-05-13 DIAGNOSIS — Z8719 Personal history of other diseases of the digestive system: Secondary | ICD-10-CM | POA: Insufficient documentation

## 2012-05-13 DIAGNOSIS — Z8614 Personal history of Methicillin resistant Staphylococcus aureus infection: Secondary | ICD-10-CM | POA: Insufficient documentation

## 2012-05-13 DIAGNOSIS — R41841 Cognitive communication deficit: Secondary | ICD-10-CM | POA: Insufficient documentation

## 2012-05-13 NOTE — ED Notes (Signed)
Suture cart to bedside. 

## 2012-05-13 NOTE — ED Notes (Addendum)
Per EMS. Patient fell at nursing home. He was brushing his teeth and the nurse heard him fall 05/12/12 @2245 . At [facility] 05/13/12 staff noticed blood coming from his R ear and called 911. EMS notes small laceration to the crease in his ear near the ear lobe. PERLA, A&Ox3, patient has very unsteady gait. Denies LOC, denies use of blood thinners. Patient was given his HS meds, including percocet and [place].

## 2012-05-13 NOTE — ED Provider Notes (Signed)
Medical screening examination/treatment/procedure(s) were conducted as a shared visit with non-physician practitioner(s) and myself.  I personally evaluated the patient during the encounter  Toy Baker, MD 05/13/12 [phone]

## 2012-05-13 NOTE — ED Notes (Signed)
Patient transported to CT 

## 2012-05-13 NOTE — ED Notes (Signed)
PA at bedside.

## 2012-05-13 NOTE — ED Provider Notes (Signed)
History     CSN: [id number]  Arrival date & time 05/13/12  0120   First MD Initiated Contact with Patient 05/13/12 [phone]      Chief Complaint  Patient presents with  . Fall    (Consider location/radiation/quality/duration/timing/severity/associated sxs/prior treatment) HPI  Past Medical History  Diagnosis Date  . Hypertension   . Duodenal ulcer hemorrhage   . Hypertrophy of prostate   . Generalized muscle weakness   . Acute respiratory failure   . Cognitive communication deficit   . MRSA (methicillin resistant Staphylococcus aureus)   . Kidney stone     Past Surgical History  Procedure Date  . Knee arthroscopy     right  . Total knee arthroplasty     right  . Wrist surgery     right  . Neck surgery   . Exploratory laparotomy 02/2011    Duodenotomy, oversew of gastric ulcer/duodenal artery  . Appendectomy   . Tibia fracture surgery     History reviewed. No pertinent family history.  History  Substance Use Topics  . Smoking status: Former Smoker -- 40 years    Quit date: 03/08/2011  . Smokeless tobacco: Not on file  . Alcohol Use: Yes     Comment: states he stopped 2 yrs ago      Review of Systems  Allergies  Aspirin and Ibuprofen  Home Medications   Current Outpatient Rx  Name  Route  Sig  Dispense  Refill  . ACETAMINOPHEN 500 MG PO TABS   Oral   Take 1,000 mg by mouth at bedtime. For pain         . DOCUSATE SODIUM 50 MG/5ML PO LIQD   Oral   Take 250 mg by mouth 2 (two) times daily.          Marland Kitchen LISINOPRIL 10 MG PO TABS   Oral   Take 10 mg by mouth daily.           Marland Kitchen MAGNESIUM OXIDE 400 MG PO TABS   Oral   Take 400 mg by mouth at bedtime. For leg cramps         . ADULT MULTIVITAMIN W/MINERALS CH   Oral   Take 1 tablet by mouth daily.         Marland Kitchen NAPROXEN SODIUM 220 MG PO TABS   Oral   Take 220 mg by mouth 2 (two) times daily as needed. For pain         . OMEPRAZOLE 20 MG PO CPDR   Oral   Take 20 mg by mouth daily.         . OXYCODONE-ACETAMINOPHEN 5-325 MG PO TABS   Oral   Take 1-2 tablets by mouth every 4 (four) hours as needed. For pain.         Marland Kitchen POLYETHYLENE GLYCOL 3350 PO PACK   Oral   Take 17 g by mouth daily.         Marland Kitchen ROPINIROLE HCL 0.25 MG PO TABS   Oral   Take 0.25 mg by mouth at bedtime.         . TRAMADOL HCL 50 MG PO TABS   Oral   Take 50 mg by mouth every 6 (six) hours as needed. For pain.         Marland Kitchen ZOLPIDEM TARTRATE 5 MG PO TABS   Oral   Take 5 mg by mouth at bedtime as needed. For sleep.         Marland Kitchen BISACODYL  10 MG RE SUPP   Rectal   Place 10 mg rectally daily as needed. For constipation.         Marland Kitchen HYOSCYAMINE SULFATE ER 0.375 MG PO TB12   Oral   Take 0.375 mg by mouth every 12 (twelve) hours as needed. For abdominal pain/spasms.           BP 130/82  Pulse 69  Temp 98.1 F (36.7 C) (Oral)  Resp 12  SpO2 99%  Physical Exam  ED Course  Procedures (including critical care time)  Labs Reviewed - No data to display Ct Head Wo Contrast  05/13/2012  *RADIOLOGY REPORT*  Clinical Data: Fall, head trauma  CT HEAD WITHOUT CONTRAST  Technique:  Contiguous axial images were obtained from the base of the skull through the vertex without contrast.  Comparison: 08/28/2009  Findings: Periventricular and subcortical white matter hypodensities are most in keeping with chronic microangiopathic change.  Nonspecific prominence of the ventricular system, particularly the fourth ventricle, similar to prior. There is cerebral and cerebellar atrophy, as well as brainstem atrophy. similar to prior.  No intraparenchymal hemorrhage, mass, mass effect, or abnormal extra-axial fluid collection.  No CT evidence for acute infarction. The visualized paranasal sinuses and mastoid air cells are predominately clear.  Bandage overlies the right ear. No displaced calvarial fracture.  IMPRESSION: Bandage overlies the right ear.  No displaced calvarial fracture.  Cerebral and cerebellar volume  loss is similar to prior.  Mild ventriculomegaly may be secondary to the volume loss, appears similar to priors.  Mild white matter changes as above.   Original Report Authenticated By: Jearld Lesch, M.D.      No diagnosis found.    MDM  Laceration repaired by PA--pt stable for d/c       Toy Baker, MD 05/13/12 [phone]

## 2012-05-13 NOTE — ED Provider Notes (Signed)
History     CSN: [id number]  Arrival date & time 05/13/12  0120   First MD Initiated Contact with Patient 05/13/12 [phone]      Chief Complaint  Patient presents with  . Fall    (Consider location/radiation/quality/duration/timing/severity/associated sxs/prior treatment) Patient is a 61 y.o. male presenting with fall. The history is provided by the patient.  Fall   patient slipped and fell the nursing home injuring his right ear prior to arrival. No loss of consciousness. Denies any neck or back pain. No chest or abdominal pain. No symptoms prior to the event. Direct pressure applied to his ear and bleeding has somewhat slow down. Denies any use of blood thinners  Past Medical History  Diagnosis Date  . Hypertension   . Duodenal ulcer hemorrhage   . Hypertrophy of prostate   . Generalized muscle weakness   . Acute respiratory failure   . Cognitive communication deficit   . MRSA (methicillin resistant Staphylococcus aureus)   . Kidney stone     Past Surgical History  Procedure Date  . Knee arthroscopy     right  . Total knee arthroplasty     right  . Wrist surgery     right  . Neck surgery   . Exploratory laparotomy 02/2011    Duodenotomy, oversew of gastric ulcer/duodenal artery  . Appendectomy   . Tibia fracture surgery     History reviewed. No pertinent family history.  History  Substance Use Topics  . Smoking status: Former Smoker -- 40 years    Quit date: 03/08/2011  . Smokeless tobacco: Not on file  . Alcohol Use: Yes     Comment: states he stopped 2 yrs ago      Review of Systems  All other systems reviewed and are negative.    Allergies  Aspirin and Ibuprofen  Home Medications   Current Outpatient Rx  Name  Route  Sig  Dispense  Refill  . ACETAMINOPHEN 500 MG PO TABS   Oral   Take 1,000 mg by mouth at bedtime. For pain         . DOCUSATE SODIUM 50 MG/5ML PO LIQD   Oral   Take 250 mg by mouth 2 (two) times daily.          Marland Kitchen  LISINOPRIL 10 MG PO TABS   Oral   Take 10 mg by mouth daily.           Marland Kitchen MAGNESIUM OXIDE 400 MG PO TABS   Oral   Take 400 mg by mouth at bedtime. For leg cramps         . ADULT MULTIVITAMIN W/MINERALS CH   Oral   Take 1 tablet by mouth daily.         Marland Kitchen NAPROXEN SODIUM 220 MG PO TABS   Oral   Take 220 mg by mouth 2 (two) times daily as needed. For pain         . OMEPRAZOLE 20 MG PO CPDR   Oral   Take 20 mg by mouth daily.          . OXYCODONE-ACETAMINOPHEN 5-325 MG PO TABS   Oral   Take 1-2 tablets by mouth every 4 (four) hours as needed. For pain.         Marland Kitchen POLYETHYLENE GLYCOL 3350 PO PACK   Oral   Take 17 g by mouth daily.         Marland Kitchen ROPINIROLE HCL 0.25 MG PO TABS  Oral   Take 0.25 mg by mouth at bedtime.         . TRAMADOL HCL 50 MG PO TABS   Oral   Take 50 mg by mouth every 6 (six) hours as needed. For pain.         Marland Kitchen ZOLPIDEM TARTRATE 5 MG PO TABS   Oral   Take 5 mg by mouth at bedtime as needed. For sleep.         Marland Kitchen BISACODYL 10 MG RE SUPP   Rectal   Place 10 mg rectally daily as needed. For constipation.         Marland Kitchen HYOSCYAMINE SULFATE ER 0.375 MG PO TB12   Oral   Take 0.375 mg by mouth every 12 (twelve) hours as needed. For abdominal pain/spasms.           BP 130/82  Pulse 69  Temp 98.1 F (36.7 C) (Oral)  Resp 12  SpO2 99%  Physical Exam  Nursing note and vitals reviewed. Constitutional: He is oriented to person, place, and time. Vital signs are normal. He appears well-developed and well-nourished.  Non-toxic appearance. No distress.  HENT:  Head: Normocephalic and atraumatic.  Ears:  Eyes: Conjunctivae normal, EOM and lids are normal. Pupils are equal, round, and reactive to light.  Neck: Normal range of motion. Neck supple. No tracheal deviation present. No mass present.  Cardiovascular: Normal rate, regular rhythm and normal heart sounds.  Exam reveals no gallop.   No murmur heard. Pulmonary/Chest: Effort normal and  breath sounds normal. No stridor. No respiratory distress. He has no decreased breath sounds. He has no wheezes. He has no rhonchi. He has no rales.  Abdominal: Soft. Normal appearance and bowel sounds are normal. He exhibits no distension. There is no tenderness. There is no rebound and no CVA tenderness.  Musculoskeletal: Normal range of motion. He exhibits no edema and no tenderness.  Neurological: He is alert and oriented to person, place, and time. He has normal strength. No cranial nerve deficit or sensory deficit. GCS eye subscore is 4. GCS verbal subscore is 5. GCS motor subscore is 6.  Skin: Skin is warm and dry. No abrasion and no rash noted.  Psychiatric: He has a normal mood and affect. His speech is normal and behavior is normal.    ED Course  LACERATION REPAIR Date/Time: 05/13/2012 4:09 AM Performed by: Elson Areas Authorized by: Elson Areas Consent: Verbal consent not obtained. Risks and benefits: risks, benefits and alternatives were discussed Consent given by: patient Required items: required blood products, implants, devices, and special equipment available Patient identity confirmed: verbally with patient Time out: Immediately prior to procedure a "time out" was called to verify the correct patient, procedure, equipment, support staff and site/side marked as required. Body area: head/neck Location details: left ear Laceration length: 1 cm Contamination: The wound is contaminated. Anesthesia: local infiltration Preparation: Patient was prepped and draped in the usual sterile fashion. Irrigation solution: saline Debridement: none Degree of undermining: none Subcutaneous closure: 5-0 Vicryl Number of sutures: 4 Technique: simple Approximation: loose Approximation difficulty: simple Patient tolerance: Patient tolerated the procedure well with no immediate complications.   (including critical care time)  Labs Reviewed - No data to display No results  found.   No diagnosis found.    MDM  Pressure dressing to ear       Elson Areas, [place] 05/13/12 [phone]

## 2012-05-13 NOTE — ED Notes (Signed)
ZOX:[id number]<AV> Expected date:<BR> Expected time:<BR> Means of arrival:<BR> Comments:<BR> Rm 23, Ptar 32, 70 M, Fall

## 2012-05-13 NOTE — ED Notes (Signed)
Patient states he fell backwards in the bathroom while brushing his teeth. Patient denies LOC. Patient has laceration to R ear.

## 2012-06-26 ENCOUNTER — Emergency Department (HOSPITAL_COMMUNITY)
Admission: EM | Admit: 2012-06-26 | Discharge: 2012-06-27 | Disposition: A | Discharge status: Home / Self Care | Payer: Medicaid Other | Attending: Emergency Medicine | Admitting: Emergency Medicine

## 2012-06-26 ENCOUNTER — Emergency Department (HOSPITAL_COMMUNITY): Payer: Medicaid Other

## 2012-06-26 DIAGNOSIS — Z8614 Personal history of Methicillin resistant Staphylococcus aureus infection: Secondary | ICD-10-CM | POA: Insufficient documentation

## 2012-06-26 DIAGNOSIS — R1032 Left lower quadrant pain: Secondary | ICD-10-CM | POA: Insufficient documentation

## 2012-06-26 DIAGNOSIS — Z8719 Personal history of other diseases of the digestive system: Secondary | ICD-10-CM | POA: Insufficient documentation

## 2012-06-26 DIAGNOSIS — Z8659 Personal history of other mental and behavioral disorders: Secondary | ICD-10-CM | POA: Insufficient documentation

## 2012-06-26 DIAGNOSIS — Z79899 Other long term (current) drug therapy: Secondary | ICD-10-CM | POA: Insufficient documentation

## 2012-06-26 DIAGNOSIS — I10 Essential (primary) hypertension: Secondary | ICD-10-CM | POA: Insufficient documentation

## 2012-06-26 DIAGNOSIS — Z8739 Personal history of other diseases of the musculoskeletal system and connective tissue: Secondary | ICD-10-CM | POA: Insufficient documentation

## 2012-06-26 DIAGNOSIS — Z87442 Personal history of urinary calculi: Secondary | ICD-10-CM | POA: Insufficient documentation

## 2012-06-26 DIAGNOSIS — R197 Diarrhea, unspecified: Secondary | ICD-10-CM | POA: Insufficient documentation

## 2012-06-26 DIAGNOSIS — Z87891 Personal history of nicotine dependence: Secondary | ICD-10-CM | POA: Insufficient documentation

## 2012-06-26 DIAGNOSIS — K529 Noninfective gastroenteritis and colitis, unspecified: Secondary | ICD-10-CM

## 2012-06-26 DIAGNOSIS — J159 Unspecified bacterial pneumonia: Secondary | ICD-10-CM | POA: Insufficient documentation

## 2012-06-26 DIAGNOSIS — K5289 Other specified noninfective gastroenteritis and colitis: Secondary | ICD-10-CM | POA: Insufficient documentation

## 2012-06-26 DIAGNOSIS — J189 Pneumonia, unspecified organism: Secondary | ICD-10-CM

## 2012-06-26 LAB — URINALYSIS, ROUTINE W REFLEX MICROSCOPIC
Hgb urine dipstick: NEGATIVE
Leukocytes, UA: NEGATIVE
Protein, ur: NEGATIVE mg/dL
Specific Gravity, Urine: 1.026 (ref 1.005–1.030)
Urobilinogen, UA: 1 mg/dL (ref 0.0–1.0)

## 2012-06-26 LAB — CBC WITH DIFFERENTIAL/PLATELET
Basophils Absolute: 0 10*3/uL (ref 0.0–0.1)
Eosinophils Absolute: 0 10*3/uL (ref 0.0–0.7)
Eosinophils Relative: 1 % (ref 0–5)
HCT: 40.6 % (ref 39.0–52.0)
MCH: 33 pg (ref 26.0–34.0)
MCHC: 34.7 g/dL (ref 30.0–36.0)
MCV: 95.1 fL (ref 78.0–100.0)
Monocytes Absolute: 0.6 10*3/uL (ref 0.1–1.0)
Platelets: 240 10*3/uL (ref 150–400)
RDW: 12.4 % (ref 11.5–15.5)

## 2012-06-26 LAB — COMPREHENSIVE METABOLIC PANEL
ALT: 37 U/L (ref 0–53)
AST: 40 U/L — ABNORMAL HIGH (ref 0–37)
Calcium: 8.5 mg/dL (ref 8.4–10.5)
Creatinine, Ser: 0.69 mg/dL (ref 0.50–1.35)
GFR calc Af Amer: >90 mL/min (ref >90)
GFR calc non Af Amer: >90 mL/min (ref >90)
Sodium: 142 mEq/L (ref 135–145)
Total Protein: 7.3 g/dL (ref 6.0–8.3)

## 2012-06-26 MED ORDER — ONDANSETRON HCL 4 MG/2ML IJ SOLN
4.0000 mg | Freq: Once | INTRAMUSCULAR | Status: AC
  Administered 2012-06-26: 4 mg via INTRAVENOUS
  Filled 2012-06-26: qty 2

## 2012-06-26 MED ORDER — MORPHINE SULFATE 4 MG/ML IJ SOLN
4.0000 mg | INTRAMUSCULAR | Status: DC | PRN
  Administered 2012-06-26: 4 mg via INTRAVENOUS
  Filled 2012-06-26: qty 1

## 2012-06-26 MED ORDER — IOHEXOL 300 MG/ML  SOLN
100.0000 mL | Freq: Once | INTRAMUSCULAR | Status: AC | PRN
  Administered 2012-06-26: 100 mL via INTRAVENOUS

## 2012-06-26 NOTE — ED Notes (Signed)
ZOX:[id number]<AV> Expected date:<BR> Expected time:<BR> Means of arrival:Ambulance<BR> Comments:<BR> N/v/d/lethargic

## 2012-06-26 NOTE — ED Provider Notes (Signed)
History     CSN: [id number]  Arrival date & time 06/26/12  1903   First MD Initiated Contact with Patient 06/26/12 2024      Chief Complaint  Patient presents with  . Nausea    diarrhea    (Consider location/radiation/quality/duration/timing/severity/associated sxs/prior treatment) HPI  Maximum Reiland is a 61 y.o. male complaining of diarrhea, abdominal pain, nausea without vomiting for the past 4 days. Patient is a poor historian. Denies fever. Level V caveat secondary to suspected dementia.   Past Medical History  Diagnosis Date  . Hypertension   . Duodenal ulcer hemorrhage   . Hypertrophy of prostate   . Generalized muscle weakness   . Acute respiratory failure   . Cognitive communication deficit   . MRSA (methicillin resistant Staphylococcus aureus)   . Kidney stone     Past Surgical History  Procedure Date  . Knee arthroscopy     right  . Total knee arthroplasty     right  . Wrist surgery     right  . Neck surgery   . Exploratory laparotomy 02/2011    Duodenotomy, oversew of gastric ulcer/duodenal artery  . Appendectomy   . Tibia fracture surgery     No family history on file.  History  Substance Use Topics  . Smoking status: Former Smoker -- 40 years    Quit date: 03/08/2011  . Smokeless tobacco: Not on file  . Alcohol Use: Yes     Comment: states he stopped 2 yrs ago      Review of Systems  Constitutional: Negative for fever.  Respiratory: Negative for shortness of breath.   Cardiovascular: Negative for chest pain.  Gastrointestinal: Positive for nausea, abdominal pain and diarrhea. Negative for vomiting.  All other systems reviewed and are negative.    Allergies  Aspirin and Ibuprofen  Home Medications   Current Outpatient Rx  Name  Route  Sig  Dispense  Refill  . ACETAMINOPHEN 500 MG PO TABS   Oral   Take 1,000 mg by mouth at bedtime. For pain         . DOCUSATE SODIUM 100 MG PO CAPS   Oral   Take 100 mg by mouth at  bedtime.         Marland Kitchen LISINOPRIL 10 MG PO TABS   Oral   Take 10 mg by mouth daily.           Marland Kitchen MAGNESIUM OXIDE 400 MG PO TABS   Oral   Take 400 mg by mouth at bedtime. For leg cramps         . MELATONIN 3 MG PO CAPS   Oral   Take 1 capsule by mouth at bedtime.         . ADULT MULTIVITAMIN W/MINERALS CH   Oral   Take 1 tablet by mouth daily.         Marland Kitchen NAPROXEN SODIUM 220 MG PO TABS   Oral   Take 220 mg by mouth 2 (two) times daily. For pain         . OMEPRAZOLE 20 MG PO CPDR   Oral   Take 20 mg by mouth daily.          Marland Kitchen POLYETHYLENE GLYCOL 3350 PO PACK   Oral   Take 17 g by mouth daily.         Marland Kitchen ROPINIROLE HCL 0.25 MG PO TABS   Oral   Take 0.25 mg by mouth at bedtime.         Marland Kitchen  SENNA 8.6 MG PO TABS   Oral   Take 1 tablet by mouth daily.         . TRAMADOL HCL 50 MG PO TABS   Oral   Take 50 mg by mouth 3 (three) times daily as needed. For pain.         Marland Kitchen BISACODYL 10 MG RE SUPP   Rectal   Place 10 mg rectally daily as needed. For constipation.         Marland Kitchen HYOSCYAMINE SULFATE ER 0.375 MG PO TB12   Oral   Take 0.375 mg by mouth every 12 (twelve) hours as needed. For abdominal pain/spasms.           BP 130/77  Pulse 68  Temp 98.5 F (36.9 C) (Oral)  Resp 16  Ht 6\' 6"  (1.981 m)  Wt 176 lb (79.833 kg)  BMI 20.34 kg/m2  SpO2 100%  Physical Exam  Nursing note and vitals reviewed. Constitutional: He is oriented to person, place, and time. He appears well-developed and well-nourished. No distress.  HENT:  Head: Normocephalic.  Eyes: Conjunctivae normal and EOM are normal. Pupils are equal, round, and reactive to light.  Neck: Normal range of motion.  Cardiovascular: Normal rate, regular rhythm, normal heart sounds and intact distal pulses.   Pulmonary/Chest: Effort normal and breath sounds normal. No stridor. No respiratory distress. He has no wheezes. He has no rales. He exhibits no tenderness.  Abdominal: Soft. Bowel sounds are  normal. There is tenderness.       Several Postsurgical scars. No distention. Diffusely tender to palpation of the abdomen especially in the left lower quadrant. Bowel sounds are present and there is no rebound.   Musculoskeletal: Normal range of motion.  Neurological: He is alert and oriented to person, place, and time.  Psychiatric: He has a normal mood and affect.    ED Course  Procedures (including critical care time)  Labs Reviewed  COMPREHENSIVE METABOLIC PANEL - Abnormal; Notable for the following:    AST 40 (*)     All other components within normal limits  CBC WITH DIFFERENTIAL  LIPASE, BLOOD  URINALYSIS, ROUTINE W REFLEX MICROSCOPIC  LAB REPORT - SCANNED   No results found.   1. Healthcare-associated pneumonia   2. Colitis       MDM  Patient is a very poor historian, changes history of present illness regularly. Physical exam does show a diffuse abdominal tenderness.  Discussed with RN Vikki Ports at [facility], she states that he is sent to the emergency room because "he has been complaining of feeling bad for several days."   Patient is tolerating by mouth and has normal UA and blood work  Chest x-ray shows abnormality that is consistent with mass consult from radiology appreciated: They recommend followup CT.  Chest CT likely consistent with pneumonia for rather than malignancy. However repeat CT is recommended.  CT abdomen pelvis shows mild diffuse wall thickening along the colon with trace soft tissue inflammation.   I will treat him for healthcare associated pneumonia in addition to colitis. Antibiotics was discussed with attending.   Pt verbalized understanding and agrees with care plan. Outpatient follow-up and return precautions given.    New Prescriptions   HYDROCODONE-ACETAMINOPHEN (VICODIN) 5-500 MG PER TABLET    Take 1-2 tablets by mouth every 6 (six) hours as needed for pain.   LEVOFLOXACIN (LEVAQUIN) 750 MG TABLET    Take 1 tablet (750 mg total) by  mouth daily. X 7 days  METRONIDAZOLE (FLAGYL) 500 MG TABLET    Take 1 tablet (500 mg total) by mouth 2 (two) times daily. One po bid x 7 days      Wynetta Emery, PA-C 06/29/12 [phone]

## 2012-06-26 NOTE — ED Notes (Signed)
Report given via EMS. Pt alert x 4 v/s stable Pt c/o n/v x 4 days, resident of [address],  Zofran 4mg  given in route. Will monitor.

## 2012-06-26 NOTE — Progress Notes (Signed)
CSW met with pt at bedside. Pt stated that he moved to [facility] 3 -4 weeks ago after having knee injuries. Pt stated that he came to [place] to visit pt daughter who got married. Pt states that he hopes to return to [employer]. Pt did not wish for CSW to inform pt daughter of ed admission.   Doree Albee  [phone] 06/26/2012 10:57pm

## 2012-06-27 MED ORDER — LEVOFLOXACIN 750 MG PO TABS: 750.0000 mg | ORAL_TABLET | Freq: Every day | ORAL | Status: DC

## 2012-06-27 MED ORDER — METRONIDAZOLE 500 MG PO TABS: 500.0000 mg | ORAL_TABLET | Freq: Two times a day (BID) | ORAL | Status: DC

## 2012-06-27 MED ORDER — HYDROCODONE-ACETAMINOPHEN 5-500 MG PO TABS: 1.0000 | ORAL_TABLET | Freq: Four times a day (QID) | ORAL | Status: DC | PRN

## 2012-06-27 MED ORDER — MORPHINE SULFATE 4 MG/ML IJ SOLN
4.0000 mg | Freq: Once | INTRAMUSCULAR | Status: AC
  Administered 2012-06-27: 4 mg via INTRAVENOUS
  Filled 2012-06-27: qty 1

## 2012-06-27 MED ORDER — METRONIDAZOLE 500 MG PO TABS
500.0000 mg | ORAL_TABLET | Freq: Once | ORAL | Status: AC
  Administered 2012-06-27: 500 mg via ORAL
  Filled 2012-06-27: qty 1

## 2012-06-27 MED ORDER — LEVOFLOXACIN 500 MG PO TABS
750.0000 mg | ORAL_TABLET | Freq: Once | ORAL | Status: AC
  Administered 2012-06-27: 750 mg via ORAL
  Filled 2012-06-27 (×2): qty 1

## 2012-06-27 MED ORDER — ONDANSETRON HCL 4 MG/2ML IJ SOLN
4.0000 mg | Freq: Once | INTRAMUSCULAR | Status: AC
  Administered 2012-06-27: 4 mg via INTRAVENOUS
  Filled 2012-06-27: qty 2

## 2012-06-27 NOTE — ED Provider Notes (Signed)
Medical screening examination/treatment/procedure(s) were conducted as a shared visit with non-physician practitioner(s) and myself.  I personally evaluated the patient during the encounter.  Pleasant and cooperative, no apparent resp distress, abd minimally tender LLQ no rebound  Hurman Horn, MD 06/27/12 1231

## 2012-06-27 NOTE — ED Notes (Signed)
Ptar in route to transport pt back to [facility]. Pt alert v/s stable, unable to call report  due to no one answering the phone. Will continue to monitor.

## 2012-06-27 NOTE — ED Notes (Signed)
Pt alert x4 v/s PTAR to transfer pt to [facility]. Tried calling report phone went unanswered. Will go ahead and tx the Pt number given to [facility] ed for the faulty to call with any question. Will continue to monitor.

## 2012-06-27 NOTE — ED Notes (Signed)
Pt alert x4 v/s stable pt to be d/c back to [facility].  Tried calling report to faultily unable to reach any one. Paged Ptar in route to pick Pt up will continue to monitor.

## 2012-06-29 NOTE — ED Provider Notes (Signed)
Medical screening examination/treatment/procedure(s) were performed by non-physician practitioner and as supervising physician I was immediately available for consultation/collaboration.   Hurman Horn, MD 06/29/12 2051

## 2012-08-18 ENCOUNTER — Emergency Department (HOSPITAL_COMMUNITY)
Admission: EM | Admit: 2012-08-18 | Discharge: 2012-08-18 | Disposition: A | Discharge status: Skilled Nursing Facility | Payer: Medicaid Other | Attending: Emergency Medicine | Admitting: Emergency Medicine

## 2012-08-18 ENCOUNTER — Emergency Department (HOSPITAL_COMMUNITY): Payer: Medicaid Other

## 2012-08-18 ENCOUNTER — Encounter (HOSPITAL_COMMUNITY): Payer: Self-pay

## 2012-08-18 DIAGNOSIS — W1789XA Other fall from one level to another, initial encounter: Secondary | ICD-10-CM | POA: Insufficient documentation

## 2012-08-18 DIAGNOSIS — L8993 Pressure ulcer of unspecified site, stage 3: Secondary | ICD-10-CM

## 2012-08-18 DIAGNOSIS — Z87448 Personal history of other diseases of urinary system: Secondary | ICD-10-CM | POA: Insufficient documentation

## 2012-08-18 DIAGNOSIS — Y9301 Activity, walking, marching and hiking: Secondary | ICD-10-CM | POA: Insufficient documentation

## 2012-08-18 DIAGNOSIS — Z8709 Personal history of other diseases of the respiratory system: Secondary | ICD-10-CM | POA: Insufficient documentation

## 2012-08-18 DIAGNOSIS — Y9210 Unspecified residential institution as the place of occurrence of the external cause: Secondary | ICD-10-CM | POA: Insufficient documentation

## 2012-08-18 DIAGNOSIS — Z79899 Other long term (current) drug therapy: Secondary | ICD-10-CM | POA: Insufficient documentation

## 2012-08-18 DIAGNOSIS — Z8614 Personal history of Methicillin resistant Staphylococcus aureus infection: Secondary | ICD-10-CM | POA: Insufficient documentation

## 2012-08-18 DIAGNOSIS — Z87442 Personal history of urinary calculi: Secondary | ICD-10-CM | POA: Insufficient documentation

## 2012-08-18 DIAGNOSIS — S300XXA Contusion of lower back and pelvis, initial encounter: Secondary | ICD-10-CM | POA: Insufficient documentation

## 2012-08-18 DIAGNOSIS — I10 Essential (primary) hypertension: Secondary | ICD-10-CM | POA: Insufficient documentation

## 2012-08-18 DIAGNOSIS — Z87891 Personal history of nicotine dependence: Secondary | ICD-10-CM | POA: Insufficient documentation

## 2012-08-18 MED ORDER — HYDROCODONE-ACETAMINOPHEN 5-325 MG PO TABS
1.0000 | ORAL_TABLET | Freq: Once | ORAL | Status: AC
  Administered 2012-08-18: 1 via ORAL
  Filled 2012-08-18: qty 1

## 2012-08-18 NOTE — ED Provider Notes (Signed)
Patient stood up this afternoon from his wheelchair and fell striking his presacral area. No other injury on exam patient alert Glasgow Coma Score 15 no distress is a small area of open skin, no active bleeding with corresponding tenderness overlying presacral area otherwise  Doug Sou, MD 08/18/12 1744

## 2012-08-18 NOTE — ED Notes (Signed)
PTAR called.  Pt given happy meal.

## 2012-08-18 NOTE — ED Notes (Signed)
Pt fell at facility at Wednesday. Pt fell on his bottom and c/o pain that has been increasing since.  Pt did not inform facility of his fall until today as pain has been getting worse.  Pt states the fall occurred because his knees gave out in the restroom. Pt rates pain 8/10 and describes it as a burning pain.

## 2012-09-02 ENCOUNTER — Inpatient Hospital Stay (HOSPITAL_COMMUNITY)
Admission: EM | Admit: 2012-09-02 | Discharge: 2012-09-04 | DRG: 872 | Disposition: A | Discharge status: Nursing Home (Includes ICF) | Payer: Medicaid Other | Attending: Family Medicine | Admitting: Family Medicine

## 2012-09-02 ENCOUNTER — Encounter (HOSPITAL_COMMUNITY): Payer: Self-pay | Admitting: Emergency Medicine

## 2012-09-02 ENCOUNTER — Emergency Department (HOSPITAL_COMMUNITY): Payer: Medicaid Other

## 2012-09-02 DIAGNOSIS — L89109 Pressure ulcer of unspecified part of back, unspecified stage: Secondary | ICD-10-CM | POA: Diagnosis present

## 2012-09-02 DIAGNOSIS — A4102 Sepsis due to Methicillin resistant Staphylococcus aureus: Principal | ICD-10-CM | POA: Diagnosis present

## 2012-09-02 DIAGNOSIS — M1990 Unspecified osteoarthritis, unspecified site: Secondary | ICD-10-CM

## 2012-09-02 DIAGNOSIS — Z8719 Personal history of other diseases of the digestive system: Secondary | ICD-10-CM

## 2012-09-02 DIAGNOSIS — Z87891 Personal history of nicotine dependence: Secondary | ICD-10-CM

## 2012-09-02 DIAGNOSIS — M71122 Other infective bursitis, left elbow: Secondary | ICD-10-CM

## 2012-09-02 DIAGNOSIS — K5900 Constipation, unspecified: Secondary | ICD-10-CM | POA: Diagnosis present

## 2012-09-02 DIAGNOSIS — L89159 Pressure ulcer of sacral region, unspecified stage: Secondary | ICD-10-CM

## 2012-09-02 DIAGNOSIS — M71129 Other infective bursitis, unspecified elbow: Secondary | ICD-10-CM

## 2012-09-02 DIAGNOSIS — G35 Multiple sclerosis: Secondary | ICD-10-CM

## 2012-09-02 DIAGNOSIS — L8990 Pressure ulcer of unspecified site, unspecified stage: Secondary | ICD-10-CM

## 2012-09-02 DIAGNOSIS — L0390 Cellulitis, unspecified: Secondary | ICD-10-CM

## 2012-09-02 DIAGNOSIS — A419 Sepsis, unspecified organism: Secondary | ICD-10-CM | POA: Diagnosis present

## 2012-09-02 DIAGNOSIS — A499 Bacterial infection, unspecified: Secondary | ICD-10-CM

## 2012-09-02 DIAGNOSIS — M7020 Olecranon bursitis, unspecified elbow: Secondary | ICD-10-CM | POA: Diagnosis present

## 2012-09-02 DIAGNOSIS — I10 Essential (primary) hypertension: Secondary | ICD-10-CM | POA: Diagnosis present

## 2012-09-02 DIAGNOSIS — Z79899 Other long term (current) drug therapy: Secondary | ICD-10-CM

## 2012-09-02 DIAGNOSIS — L8992 Pressure ulcer of unspecified site, stage 2: Secondary | ICD-10-CM | POA: Diagnosis present

## 2012-09-02 DIAGNOSIS — K5909 Other constipation: Secondary | ICD-10-CM

## 2012-09-02 LAB — CBC WITH DIFFERENTIAL/PLATELET
Basophils Relative: 0 % (ref 0–1)
HCT: 40 % (ref 39.0–52.0)
Hemoglobin: 13.9 g/dL (ref 13.0–17.0)
Lymphs Abs: 1.4 10*3/uL (ref 0.7–4.0)
MCH: 32.4 pg (ref 26.0–34.0)
MCHC: 34.8 g/dL (ref 30.0–36.0)
Monocytes Absolute: 0.7 10*3/uL (ref 0.1–1.0)
Monocytes Relative: 8 % (ref 3–12)
Neutro Abs: 6.4 10*3/uL (ref 1.7–7.7)
Neutrophils Relative %: 75 % (ref 43–77)
RBC: 4.29 MIL/uL (ref 4.22–5.81)

## 2012-09-02 LAB — BASIC METABOLIC PANEL
BUN: 10 mg/dL (ref 6–23)
Chloride: 101 mEq/L (ref 96–112)
Creatinine, Ser: 0.65 mg/dL (ref 0.50–1.35)
GFR calc Af Amer: >90 mL/min (ref >90)
Glucose, Bld: 111 mg/dL — ABNORMAL HIGH (ref 70–99)
Potassium: 3.7 mEq/L (ref 3.5–5.1)

## 2012-09-02 MED ORDER — SODIUM CHLORIDE 0.9 % IV BOLUS (SEPSIS)
1000.0000 mL | INTRAVENOUS | Status: AC
  Administered 2012-09-02: 1000 mL via INTRAVENOUS

## 2012-09-02 MED ORDER — ONDANSETRON HCL 4 MG/2ML IJ SOLN
4.0000 mg | Freq: Once | INTRAMUSCULAR | Status: AC
  Administered 2012-09-02: 4 mg via INTRAVENOUS
  Filled 2012-09-02: qty 2

## 2012-09-02 MED ORDER — HYDROCODONE-ACETAMINOPHEN 5-325 MG PO TABS
1.0000 | ORAL_TABLET | ORAL | Status: DC | PRN
  Administered 2012-09-03: 2 via ORAL
  Administered 2012-09-03 (×3): 1 via ORAL
  Administered 2012-09-03 – 2012-09-04 (×4): 2 via ORAL
  Filled 2012-09-02 (×2): qty 2
  Filled 2012-09-02 (×2): qty 1
  Filled 2012-09-02: qty 2
  Filled 2012-09-02: qty 1
  Filled 2012-09-02 (×2): qty 2

## 2012-09-02 MED ORDER — ONDANSETRON HCL 4 MG PO TABS: 4.0000 mg | ORAL_TABLET | Freq: Four times a day (QID) | ORAL | Status: DC | PRN

## 2012-09-02 MED ORDER — MORPHINE SULFATE 4 MG/ML IJ SOLN
4.0000 mg | Freq: Once | INTRAMUSCULAR | Status: AC
  Administered 2012-09-02: 4 mg via INTRAVENOUS
  Filled 2012-09-02: qty 1

## 2012-09-02 MED ORDER — DOCUSATE SODIUM 100 MG PO CAPS
100.0000 mg | ORAL_CAPSULE | Freq: Every evening | ORAL | Status: DC
  Administered 2012-09-03: 100 mg via ORAL
  Filled 2012-09-02 (×2): qty 1

## 2012-09-02 MED ORDER — SODIUM CHLORIDE 0.9 % IV SOLN
INTRAVENOUS | Status: DC
  Administered 2012-09-02: 125 mL via INTRAVENOUS
  Administered 2012-09-03: 19:00:00 via INTRAVENOUS

## 2012-09-02 MED ORDER — ACETAMINOPHEN 325 MG PO TABS
650.0000 mg | ORAL_TABLET | Freq: Once | ORAL | Status: AC
  Administered 2012-09-02: 650 mg via ORAL
  Filled 2012-09-02: qty 2

## 2012-09-02 MED ORDER — ACETAMINOPHEN 325 MG PO TABS: 650.0000 mg | ORAL_TABLET | Freq: Four times a day (QID) | ORAL | Status: DC | PRN

## 2012-09-02 MED ORDER — ROPINIROLE HCL 0.25 MG PO TABS
0.2500 mg | ORAL_TABLET | Freq: Every day | ORAL | Status: DC
  Administered 2012-09-03 (×2): 0.25 mg via ORAL
  Filled 2012-09-02 (×3): qty 1

## 2012-09-02 MED ORDER — SENNA 8.6 MG PO TABS
1.0000 | ORAL_TABLET | Freq: Every day | ORAL | Status: DC
  Administered 2012-09-03 (×2): 8.6 mg via ORAL
  Filled 2012-09-02 (×2): qty 1

## 2012-09-02 MED ORDER — MORPHINE SULFATE 2 MG/ML IJ SOLN
2.0000 mg | INTRAMUSCULAR | Status: DC | PRN
  Administered 2012-09-03 – 2012-09-04 (×5): 2 mg via INTRAVENOUS
  Filled 2012-09-02 (×5): qty 1

## 2012-09-02 MED ORDER — SODIUM CHLORIDE 0.9 % IV SOLN: INTRAVENOUS | Status: AC

## 2012-09-02 MED ORDER — VANCOMYCIN HCL IN DEXTROSE 1-5 GM/200ML-% IV SOLN
1000.0000 mg | Freq: Once | INTRAVENOUS | Status: AC
  Administered 2012-09-02: 1000 mg via INTRAVENOUS
  Filled 2012-09-02 (×2): qty 200

## 2012-09-02 MED ORDER — ONDANSETRON HCL 4 MG/2ML IJ SOLN: 4.0000 mg | Freq: Four times a day (QID) | INTRAMUSCULAR | Status: DC | PRN

## 2012-09-02 MED ORDER — PANTOPRAZOLE SODIUM 40 MG PO TBEC
40.0000 mg | DELAYED_RELEASE_TABLET | Freq: Every day | ORAL | Status: DC
  Administered 2012-09-03 – 2012-09-04 (×2): 40 mg via ORAL
  Filled 2012-09-02 (×2): qty 1

## 2012-09-02 MED ORDER — ACETAMINOPHEN 650 MG RE SUPP: 650.0000 mg | Freq: Four times a day (QID) | RECTAL | Status: DC | PRN

## 2012-09-02 MED ORDER — LISINOPRIL 10 MG PO TABS
10.0000 mg | ORAL_TABLET | Freq: Every day | ORAL | Status: DC
  Administered 2012-09-03 – 2012-09-04 (×2): 10 mg via ORAL
  Filled 2012-09-02 (×2): qty 1

## 2012-09-02 MED ORDER — MAGNESIUM HYDROXIDE 400 MG/5ML PO SUSP: 15.0000 mL | Freq: Every day | ORAL | Status: DC | PRN

## 2012-09-02 NOTE — ED Provider Notes (Signed)
History     CSN: [id number]  Arrival date & time 08/18/12  1605   First MD Initiated Contact with Patient 08/18/12 1630      Chief Complaint  Patient presents with  . Fall    (Consider location/radiation/quality/duration/timing/severity/associated sxs/prior treatment) Patient is a 62 y.o. male presenting with fall.  Fall The accident occurred 2 days ago. The fall occurred while walking. He fell from a height of 3 to 5 ft. He landed on a hard floor. There was no blood loss. Point of impact: sacrum. Pain location: sacrum. The pain is moderate. He was ambulatory at the scene. There was no entrapment after the fall. There was no drug use involved in the accident. There was no alcohol use involved in the accident. Pertinent negatives include no visual change, no fever, no numbness, no abdominal pain, no nausea, no vomiting, no headaches, no loss of consciousness and no tingling. The symptoms are aggravated by pressure on the injury. He has tried rest for the symptoms. The treatment provided no relief.    Past Medical History  Diagnosis Date  . Hypertension   . Duodenal ulcer hemorrhage   . Hypertrophy of prostate   . Generalized muscle weakness   . Acute respiratory failure   . Cognitive communication deficit   . MRSA (methicillin resistant Staphylococcus aureus)   . Kidney stone     Past Surgical History  Procedure Laterality Date  . Knee arthroscopy      right  . Total knee arthroplasty      right  . Wrist surgery      right  . Neck surgery    . Exploratory laparotomy  02/2011    Duodenotomy, oversew of gastric ulcer/duodenal artery  . Appendectomy    . Tibia fracture surgery      History reviewed. No pertinent family history.  History  Substance Use Topics  . Smoking status: Former Smoker -- 40 years    Quit date: 03/08/2011  . Smokeless tobacco: Not on file  . Alcohol Use: No     Comment: states he stopped 2 yrs ago      Review of Systems  Constitutional:  Negative for fever, chills, diaphoresis, activity change, appetite change and fatigue.  HENT: Negative for congestion, sore throat, facial swelling, rhinorrhea, drooling, neck pain and voice change.   Respiratory: Negative for shortness of breath and stridor.   Gastrointestinal: Negative for nausea, vomiting, abdominal pain, diarrhea and abdominal distention.  Endocrine: Negative for polydipsia and polyuria.  Genitourinary: Negative for dysuria, urgency, frequency and decreased urine volume.  Musculoskeletal: Negative for back pain and gait problem.  Skin: Positive for wound. Negative for color change.  Neurological: Negative for tingling, loss of consciousness, facial asymmetry, weakness, numbness and headaches.  Hematological: Does not bruise/bleed easily.  Psychiatric/Behavioral: Negative for confusion and agitation.    Allergies  Aspirin and Ibuprofen  Home Medications   Current Outpatient Rx  Name  Route  Sig  Dispense  Refill  . acetaminophen (TYLENOL) 325 MG tablet   Oral   Take 650 mg by mouth every 6 (six) hours as needed for pain.         Marland Kitchen acetaminophen (TYLENOL) 500 MG tablet   Oral   Take 1,000 mg by mouth at bedtime. For pain         . diphenhydrAMINE (BENADRYL) 50 MG capsule   Oral   Take 50 mg by mouth every 4 (four) hours as needed (nausea/vomiting).         Marland Kitchen  docusate sodium (COLACE) 100 MG capsule   Oral   Take 100 mg by mouth every evening.          Marland Kitchen guaifenesin (ROBITUSSIN) 100 MG/5ML syrup   Oral   Take 200 mg by mouth every 6 (six) hours as needed for cough.         Marland Kitchen HYDROcodone-acetaminophen (VICODIN) 5-500 MG per tablet   Oral   Take 1-2 tablets by mouth every 6 (six) hours as needed for pain.   15 tablet   0   . lisinopril (PRINIVIL,ZESTRIL) 10 MG tablet   Oral   Take 10 mg by mouth daily.           Marland Kitchen loperamide (IMODIUM) 2 MG capsule   Oral   Take 2 mg by mouth 4 (four) times daily as needed for diarrhea or loose stools.           . magnesium hydroxide (MILK OF MAGNESIA) 400 MG/5ML suspension   Oral   Take 15 mLs by mouth daily as needed for constipation.         . magnesium oxide (MAG-OX) 400 MG tablet   Oral   Take 400 mg by mouth at bedtime. For leg cramps         . Melatonin 3 MG CAPS   Oral   Take 1 capsule by mouth at bedtime.         . Multiple Vitamin (MULTIVITAMIN WITH MINERALS) TABS   Oral   Take 1 tablet by mouth daily.         Marland Kitchen neomycin-bacitracin-polymyxin (NEOSPORIN) ointment   Topical   Apply 1 application topically every 12 (twelve) hours. apply to eye         . omeprazole (PRILOSEC) 20 MG capsule   Oral   Take 20 mg by mouth daily.          Marland Kitchen rOPINIRole (REQUIP) 0.25 MG tablet   Oral   Take 0.25 mg by mouth at bedtime.         . senna (SENOKOT) 8.6 MG TABS   Oral   Take 1 tablet by mouth at bedtime.            BP 126/82  Pulse 68  Temp(Src) 97.9 F (36.6 C) (Oral)  Resp 18  Ht 6\' 6"  (1.981 m)  Wt 176 lb (79.833 kg)  BMI 20.34 kg/m2  SpO2 100%  Physical Exam  Constitutional: He is oriented to person, place, and time. He appears well-developed and well-nourished. No distress.  HENT:  Head: Normocephalic and atraumatic.  Mouth/Throat: No oropharyngeal exudate.  Eyes: Pupils are equal, round, and reactive to light.  Neck: Normal range of motion. Neck supple.  Cardiovascular: Normal rate, regular rhythm and normal heart sounds.  Exam reveals no gallop and no friction rub.   No murmur heard. Pulmonary/Chest: Effort normal and breath sounds normal. No respiratory distress. He has no wheezes. He has no rales.  Abdominal: Soft. Bowel sounds are normal. He exhibits no distension and no mass. There is no tenderness. There is no rebound and no guarding.  Genitourinary:     Musculoskeletal: Normal range of motion. He exhibits no edema and no tenderness.       Left hip: He exhibits tenderness.       Back:  Neurological: He is alert and oriented to  person, place, and time.  Skin: Skin is warm and dry.  Psychiatric: He has a normal mood and affect.    ED Course  Procedures (including critical care time)  Labs Reviewed - No data to display No results found.   1. Contusion of coccyx   2. Decubital ulcer, stage III       MDM   Pt is a 62 y.o. male with pertinent PMHX of HTN, generalized muscle weakness, who lives in SNF, who presents with sacral pain after he slid out of his wheelchair onto his buttocks 2 days ago.  Fall was mechanical and occured as pt tried to ambulate to bathroom (usually uses a wheel chair).  No head injury, no LOC, no neck pain. He has had some bleeding at the site several days later.  On PE, VSS, pt in NAD. He as a stage three sacral ulcer over site of reported pain.  I believe this is likely bleeding pt saw. Pt tender over sacrum & L hip.  XR ordered of these areas and were negative for acute bony injury.  Will d/c home w/ instructions for dressing changes, frequent repositioning, and norco as needed for pain. Return precautions given for new or worsening symptoms.     1. Contusion of coccyx   2. Decubital ulcer, stage III      Labs and imaging considered in decision making, reviewed by myself.  Imaging interpreted by radiology. Pt care discussed with my attending, Dr. Ethelda Chick.    Toy Cookey, MD 09/02/12 [phone]

## 2012-09-02 NOTE — ED Notes (Signed)
Bed:WA01<BR> Expected date:<BR> Expected time:<BR> Means of arrival:<BR> Comments:<BR> Hold for triage

## 2012-09-02 NOTE — ED Provider Notes (Signed)
I have personally seen and examined the patient.  I have discussed the plan of care with the resident.  I have reviewed the documentation on PMH/FH/Soc. History.  I have reviewed the documentation of the resident and agree.  Doug Sou, MD 09/02/12 [phone]

## 2012-09-02 NOTE — ED Provider Notes (Signed)
MSE was initiated and I personally evaluated the patient and placed orders (if any) at  5:47 PM on September 02, 2012.  The patient appears stable so that the remainder of the MSE may be completed by another provider.  [name] is a 62 y.o. male brought in by ambulance from [name] who presents to the Emergency Department with a chief complaint of a gradually worsening, constant, 8/10 painful insect bite that began 4 days ago with associated redness that began 3 days ago. He reports that he said saw the spider and killed it, but denies actually seeing the spider on his skin.  ROS: Patient denies fever, headache, blurred vision, new hearing loss, sore throat, chest pain, shortness of breath, nausea, vomiting, diarrhea, constipation, dysuria, peripheral edema, back pain, numbness or tingling of the extremities.   PE: Gen: A&O x4 HEENT: PERRL, EOM intact CHEST: RRR, no m/r/g LUNGS: CTAB, no w/r/r ABD: BS x 4, ND/NT EXT: No edema, strong peripheral pulses, pain with left elbow movement NEURO: Sensation and strength intact bilaterally SKIN: swollen left elbow, surrounding cellulitis to forearm, marked with skin marker  A/P: Plain films of left elbow, labs, vancomycin, move to acute side    Roxy Horseman, PA-C 09/02/12 1748

## 2012-09-02 NOTE — H&P (Signed)
History and Physical  [name] ZOX:[id number] DOB: 1950/07/20 DOA: 09/02/2012  Referring physician: Gwyneth Sprout, MD PCP: No primary provider on file. none.  Chief Complaint: Left elbow pain  HPI:  62 year old man presented with left elbow edema and pain. Diagnosed with septic olecranon bursitis and referred for admission.  Symptoms began approximately 4 days ago with redness, swelling of the elbow and pain. These symptoms have worsened over time and erythema has developed. He reports seeing a spider on his bed before his symptoms began however he did not see the spider actually on his skin.  In ED Afebrile, VSS Screening labs unremarkable. Elbow x-ray--Severe soft tissue swelling overlying the olecranon. No joint effusion. Bursa aspirated by EDP, IV vancomycin, orthopedics consulted.  Review of Systems:  Negative for fever, visual changes, sore throat, rash, chest pain, SOB, dysuria, bleeding, n/v/abdominal pain.  Positive for constipation  Past Medical History  Diagnosis Date  . Hypertension   . Duodenal ulcer hemorrhage   . Hypertrophy of prostate   . Generalized muscle weakness   . Acute respiratory failure   . Cognitive communication deficit   . MRSA (methicillin resistant Staphylococcus aureus)   . Kidney stone     Past Surgical History  Procedure Laterality Date  . Knee arthroscopy      right  . Total knee arthroplasty      right  . Wrist surgery      right  . Neck surgery    . Exploratory laparotomy  02/2011    Duodenotomy, oversew of gastric ulcer/duodenal artery  . Appendectomy    . Tibia fracture surgery      Social History:  reports that he quit smoking about 17 months ago. He does not have any smokeless tobacco history on file. He reports that he does not drink alcohol or use illicit drugs.  Allergies  Allergen Reactions  . Aspirin Other (See Comments)    Noted on MAR. Reaction unknown  . Ibuprofen Other (See Comments)    Noted on MAR.  Reaction unknown    History reviewed. No pertinent family history. No particular problems per patient  Prior to Admission medications   Medication Sig Start Date End Date Taking? Authorizing Provider  HYDROcodone-acetaminophen (VICODIN) 5-500 MG per tablet Take 1-2 tablets by mouth every 6 (six) hours as needed for pain. 06/27/12  Yes Nicole Pisciotta, PA-C  acetaminophen (TYLENOL) 325 MG tablet Take 650 mg by mouth every 6 (six) hours as needed for pain.    Historical Provider, MD  acetaminophen (TYLENOL) 500 MG tablet Take 1,000 mg by mouth at bedtime. For pain    Historical Provider, MD  diphenhydrAMINE (BENADRYL) 50 MG capsule Take 50 mg by mouth every 4 (four) hours as needed (nausea/vomiting).    Historical Provider, MD  docusate sodium (COLACE) 100 MG capsule Take 100 mg by mouth every evening.     Historical Provider, MD  guaifenesin (ROBITUSSIN) 100 MG/5ML syrup Take 200 mg by mouth every 6 (six) hours as needed for cough.    Historical Provider, MD  lisinopril (PRINIVIL,ZESTRIL) 10 MG tablet Take 10 mg by mouth daily.      Historical Provider, MD  loperamide (IMODIUM) 2 MG capsule Take 2 mg by mouth 4 (four) times daily as needed for diarrhea or loose stools.    Historical Provider, MD  magnesium hydroxide (MILK OF MAGNESIA) 400 MG/5ML suspension Take 15 mLs by mouth daily as needed for constipation.    Historical Provider, MD  magnesium oxide (MAG-OX) 400 MG  tablet Take 400 mg by mouth at bedtime. For leg cramps    Historical Provider, MD  Melatonin 3 MG CAPS Take 1 capsule by mouth at bedtime.    Historical Provider, MD  Multiple Vitamin (MULTIVITAMIN WITH MINERALS) TABS Take 1 tablet by mouth daily.    Historical Provider, MD  neomycin-bacitracin-polymyxin (NEOSPORIN) ointment Apply 1 application topically every 12 (twelve) hours. apply to eye    Historical Provider, MD  omeprazole (PRILOSEC) 20 MG capsule Take 20 mg by mouth daily.     Historical Provider, MD  rOPINIRole (REQUIP)  0.25 MG tablet Take 0.25 mg by mouth at bedtime.    Historical Provider, MD  senna (SENOKOT) 8.6 MG TABS Take 1 tablet by mouth at bedtime.     Historical Provider, MD   Physical Exam: Filed Vitals:   09/02/12 1706 09/02/12 1928  BP: 136/80 144/80  Pulse: 100 100  Temp: 99.9 F (37.7 C)   TempSrc: Oral   Resp: 16 98  SpO2: 100% 100%    General:  Examined in the emergency department. Appears calm and comfortable Eyes: PERRL, normal lids, irises  ENT: grossly normal hearing, lips & tongue Neck: no LAD, masses or thyromegaly Cardiovascular: RRR, no m/r/g. No LE edema. Respiratory: CTA bilaterally, no w/r/r. Normal respiratory effort. Abdomen: soft, ntnd, well-healed vertical incision Skin: Left olecranon bursa edematous, erythema extending up the arm, tender to palpation. Able to extend elbow fully and nearly flex elbow fully. Musculoskeletal: grossly normal tone BUE/BLE. Well-healed vertical incision over her right knee. Psychiatric: grossly normal mood and affect, speech fluent and appropriate Neurologic: grossly non-focal.  Wt Readings from Last 3 Encounters:  08/18/12 79.833 kg (176 lb)  06/26/12 79.833 kg (176 lb)  11/17/11 84.369 kg (186 lb)    Labs on Admission:  Basic Metabolic Panel:  Recent Labs Lab 09/02/12 1815  NA 137  K 3.7  CL 101  CO2 28  GLUCOSE 111*  BUN 10  CREATININE 0.65  CALCIUM 9.1   CBC:  Recent Labs Lab 09/02/12 1815  WBC 8.5  NEUTROABS 6.4  HGB 13.9  HCT 40.0  MCV 93.2  PLT 296    Radiological Exams on Admission: Dg Elbow Complete Left  09/02/2012  *RADIOLOGY REPORT*  Clinical Data: Insect bite, cellulitis, left posterior elbow  LEFT ELBOW - COMPLETE 3+ VIEW  Comparison: 02/24/2011  Findings: No fracture or dislocation is seen.  Moderate degenerative changes with olecranon spurring.  No displaced elbow joint fat pads to suggest an elbow joint effusion.  Severe soft tissue swelling overlying the olecranon.  No radiopaque foreign body  is seen.  IMPRESSION: Severe soft tissue swelling overlying the olecranon.  No radiopaque foreign body is seen.  Moderate degenerative changes.   Original Report Authenticated By: Charline Bills, M.D.      Principal Problem:   Septic olecranon bursitis Active Problems:   Chronic constipation   Sacral decubitus ulcer   Assessment/Plan 1. Septic olecranon bursitis: Status post aspiration in the emergency department with culture pending. Orthopedics has been consulted and will see 3/9. Continue IV antibiotics, pain control.  2. History of MRSA left forearm 01/2011 3. Chronic Constipation: Bowel regimen. 4. Sacral ulcer, present on admission: Consult wound care.  Code Status: Full code Family Communication: None present Disposition Plan/Anticipated LOS: Admit, 2-4 days  Time spent: 50 minutes  Brendia Sacks, MD  [facility] Pager [phone] 09/02/2012, 9:57 PM

## 2012-09-02 NOTE — ED Provider Notes (Addendum)
History     CSN: [id number]  Arrival date & time 09/02/12  1700   First MD Initiated Contact with Patient 09/02/12 1713      Chief Complaint  Patient presents with  . Insect Bite  . Cellulitis    (Consider location/radiation/quality/duration/timing/severity/associated sxs/prior treatment) HPI Comments: Patient presents with 4 days of worsening left elbow pain, swelling, redness and warmth. He states he thinks he was bit by a spider because he woke up one morning and there was a spider on his pillow however he did not see anything bite him. He complains of being feverish for the last 3 days but has not checked his temperature. His pain is 9/10. He denies any weakness or numbness. No nausea vomiting or diarrhea. He states he always has constipation and his last stool was 4 days ago despite his normal medications. This is not unusual for him.  The history is provided by the patient.    Past Medical History  Diagnosis Date  . Hypertension   . Duodenal ulcer hemorrhage   . Hypertrophy of prostate   . Generalized muscle weakness   . Acute respiratory failure   . Cognitive communication deficit   . MRSA (methicillin resistant Staphylococcus aureus)   . Kidney stone     Past Surgical History  Procedure Laterality Date  . Knee arthroscopy      right  . Total knee arthroplasty      right  . Wrist surgery      right  . Neck surgery    . Exploratory laparotomy  02/2011    Duodenotomy, oversew of gastric ulcer/duodenal artery  . Appendectomy    . Tibia fracture surgery      History reviewed. No pertinent family history.  History  Substance Use Topics  . Smoking status: Former Smoker -- 40 years    Quit date: 03/08/2011  . Smokeless tobacco: Not on file  . Alcohol Use: No     Comment: states he stopped 2 yrs ago      Review of Systems  Constitutional: Positive for fever and chills.  Respiratory: Negative for cough and shortness of breath.   Gastrointestinal: Positive  for constipation. Negative for nausea and vomiting.  Musculoskeletal: Positive for joint swelling.  All other systems reviewed and are negative.    Allergies  Aspirin and Ibuprofen  Home Medications   Current Outpatient Rx  Name  Route  Sig  Dispense  Refill  . HYDROcodone-acetaminophen (VICODIN) 5-500 MG per tablet   Oral   Take 1-2 tablets by mouth every 6 (six) hours as needed for pain.   15 tablet   0   . acetaminophen (TYLENOL) 325 MG tablet   Oral   Take 650 mg by mouth every 6 (six) hours as needed for pain.         Marland Kitchen acetaminophen (TYLENOL) 500 MG tablet   Oral   Take 1,000 mg by mouth at bedtime. For pain         . diphenhydrAMINE (BENADRYL) 50 MG capsule   Oral   Take 50 mg by mouth every 4 (four) hours as needed (nausea/vomiting).         Marland Kitchen docusate sodium (COLACE) 100 MG capsule   Oral   Take 100 mg by mouth every evening.          Marland Kitchen guaifenesin (ROBITUSSIN) 100 MG/5ML syrup   Oral   Take 200 mg by mouth every 6 (six) hours as needed for cough.         Marland Kitchen  lisinopril (PRINIVIL,ZESTRIL) 10 MG tablet   Oral   Take 10 mg by mouth daily.           Marland Kitchen loperamide (IMODIUM) 2 MG capsule   Oral   Take 2 mg by mouth 4 (four) times daily as needed for diarrhea or loose stools.         . magnesium hydroxide (MILK OF MAGNESIA) 400 MG/5ML suspension   Oral   Take 15 mLs by mouth daily as needed for constipation.         . magnesium oxide (MAG-OX) 400 MG tablet   Oral   Take 400 mg by mouth at bedtime. For leg cramps         . Melatonin 3 MG CAPS   Oral   Take 1 capsule by mouth at bedtime.         . Multiple Vitamin (MULTIVITAMIN WITH MINERALS) TABS   Oral   Take 1 tablet by mouth daily.         Marland Kitchen neomycin-bacitracin-polymyxin (NEOSPORIN) ointment   Topical   Apply 1 application topically every 12 (twelve) hours. apply to eye         . omeprazole (PRILOSEC) 20 MG capsule   Oral   Take 20 mg by mouth daily.          Marland Kitchen  rOPINIRole (REQUIP) 0.25 MG tablet   Oral   Take 0.25 mg by mouth at bedtime.         . senna (SENOKOT) 8.6 MG TABS   Oral   Take 1 tablet by mouth at bedtime.            BP 144/80  Pulse 100  Temp(Src) 99.9 F (37.7 C) (Oral)  Resp 98  SpO2 100%  Physical Exam  Nursing note and vitals reviewed. Constitutional: He is oriented to person, place, and time. He appears well-developed and well-nourished. No distress.  HENT:  Head: Normocephalic and atraumatic.  Mouth/Throat: Oropharynx is clear and moist.  Eyes: Conjunctivae and EOM are normal. Pupils are equal, round, and reactive to light.  Neck: Normal range of motion. Neck supple.  Cardiovascular: Regular rhythm and intact distal pulses.  Tachycardia present.   No murmur heard. Pulmonary/Chest: Effort normal and breath sounds normal. No respiratory distress. He has no wheezes. He has no rales.  Abdominal: Soft. He exhibits no distension. There is no tenderness. There is no rebound and no guarding.  Midline well-healed abdominal scar with mild diffuse abdominal tenderness  Musculoskeletal: Normal range of motion. He exhibits tenderness. He exhibits no edema.       Left elbow: He exhibits swelling and effusion. He exhibits normal range of motion and no deformity. Tenderness found. Olecranon process tenderness noted.       Arms: Neurological: He is alert and oriented to person, place, and time.  Skin: Skin is warm and dry. No rash noted. No erythema.  Psychiatric: He has a normal mood and affect. His behavior is normal.    ED Course  Procedures (including critical care time)  Labs Reviewed  BASIC METABOLIC PANEL - Abnormal; Notable for the following:    Glucose, Bld 111 (*)    All other components within normal limits  BODY FLUID CULTURE  CBC WITH DIFFERENTIAL   Dg Elbow Complete Left  09/02/2012  *RADIOLOGY REPORT*  Clinical Data: Insect bite, cellulitis, left posterior elbow  LEFT ELBOW - COMPLETE 3+ VIEW  Comparison:  02/24/2011  Findings: No fracture or dislocation is seen.  Moderate degenerative  changes with olecranon spurring.  No displaced elbow joint fat pads to suggest an elbow joint effusion.  Severe soft tissue swelling overlying the olecranon.  No radiopaque foreign body is seen.  IMPRESSION: Severe soft tissue swelling overlying the olecranon.  No radiopaque foreign body is seen.  Moderate degenerative changes.   Original Report Authenticated By: Charline Bills, M.D.      1. Cellulitis   2. Septic olecranon bursitis, left       MDM   Patient states that he possibly was bit by a spider in his left elbow 4 days ago. However he states he woke up there was a spider on his bed. However his left elbow is swollen consistent more with a septic bursitis with surrounding erythema. He states he has been feverish for the last 2 days. There is sig significant elbow  joint effusion without other findings on his plain film. Labs are within normal limits. Will aspirate the olecranon bursa and sent for culture. Patient was started on vancomycin prior to me seeing him.  9:13 PM Bedside aspiration it shows per he went discharged from the left olecranon concerning for septic bursitis. Will discuss with Dr. Izora Ribas with hand surgery and will admit [facility] for ongoing IV antibiotics.  10:36 PM Spoke with Dr. Ave Filter and he wants IV abx overnight and NPO after midnight and will see him in the morning.    Gwyneth Sprout, MD 09/02/12 2113  Gwyneth Sprout, MD 09/02/12 1610  Gwyneth Sprout, MD 09/02/12 2237

## 2012-09-02 NOTE — ED Notes (Signed)
Pt from [facility].  States he was bitten by an insect on Wednesday and he didn't tell anyone there because he said they wouldn't do anything.  Lt elbow swollen.  Warm to touch.  PMS intact.  Full range of motion.  Denies other sx.  Wheelchair bound.

## 2012-09-02 NOTE — ED Provider Notes (Deleted)
[name] is a 62 y.o. male brought in by ambulance from [name] who presents to the Emergency Department with a chief complaint of a gradually worsening, constant, 8/10 painful insect bite that began 4 days ago with associated redness that began 3 days ago. He reports that he said saw the spider and killed it, but denies actually seeing the spider on his skin.     Roxy Horseman, PA-C 09/02/12 [phone]

## 2012-09-02 NOTE — ED Notes (Signed)
Pt states he saw the spider.  Killed it.  Unsure what kind.

## 2012-09-02 NOTE — ED Provider Notes (Signed)
Medical screening examination/treatment/procedure(s) were conducted as a shared visit with non-physician practitioner(s) and myself.  I personally evaluated the patient during the encounter   Kevin Sprout, MD 09/02/12 2345

## 2012-09-03 ENCOUNTER — Encounter (HOSPITAL_COMMUNITY): Payer: Self-pay | Admitting: *Deleted

## 2012-09-03 MED ORDER — LIDOCAINE HCL 1 % IJ SOLN
INTRAMUSCULAR | Status: AC
  Filled 2012-09-03: qty 20

## 2012-09-03 MED ORDER — PNEUMOCOCCAL VAC POLYVALENT 25 MCG/0.5ML IJ INJ
0.5000 mL | INJECTION | Freq: Once | INTRAMUSCULAR | Status: AC
  Administered 2012-09-03: 0.5 mL via INTRAMUSCULAR
  Filled 2012-09-03: qty 0.5

## 2012-09-03 MED ORDER — INFLUENZA VIRUS VACC SPLIT PF IM SUSP
0.5000 mL | Freq: Once | INTRAMUSCULAR | Status: AC
  Administered 2012-09-03: 0.5 mL via INTRAMUSCULAR
  Filled 2012-09-03: qty 0.5

## 2012-09-03 MED ORDER — PROSIGHT PO TABS
1.0000 | ORAL_TABLET | Freq: Every day | ORAL | Status: DC
  Administered 2012-09-03 – 2012-09-04 (×2): 1 via ORAL
  Filled 2012-09-03 (×3): qty 1

## 2012-09-03 MED ORDER — VANCOMYCIN HCL 1000 MG IV SOLR
750.0000 mg | Freq: Three times a day (TID) | INTRAVENOUS | Status: DC
  Administered 2012-09-03 – 2012-09-04 (×5): 750 mg via INTRAVENOUS
  Filled 2012-09-03 (×6): qty 750

## 2012-09-03 MED ORDER — ENOXAPARIN SODIUM 40 MG/0.4ML ~~LOC~~ SOLN
40.0000 mg | SUBCUTANEOUS | Status: DC
  Administered 2012-09-03: 40 mg via SUBCUTANEOUS
  Filled 2012-09-03 (×2): qty 0.4

## 2012-09-03 MED ORDER — DICLOFENAC SODIUM 1 % TD GEL
2.0000 g | Freq: Two times a day (BID) | TRANSDERMAL | Status: DC
  Administered 2012-09-03 – 2012-09-04 (×3): 2 g via TOPICAL
  Filled 2012-09-03: qty 100

## 2012-09-03 NOTE — Progress Notes (Signed)
[facility] PROGRESS NOTE  [name] WJX:[id number] DOB: Feb 22, 1951 DOA: 09/18/2012 PCP: No primary [name] on file.  Brief Narrative: 62 year old man presented with left elbow edema and pain. Diagnosed with septic olecranon bursitis and referred for admission. Symptoms began approximately 4 days ago with redness, swelling of the elbow and pain. These symptoms have worsened over time and erythema has developed. He reports seeing a spider on his bed before his symptoms began however he did not see the spider actually on his skin.  Assessment/Plan: 1. Septic olecranon bursitis - orthopedics evaluated patient and they, now status post porcine respiration. Continue IV antibiotics for today. Microbiology pending. 2. Sacral ulcer - wound care consulted 3. Chronic constipation - continue bowel regimen 4. Prophylaxis - start Lovenox 09/03/12 following the procedure.  Code Status: Full Family Communication: None  Disposition Plan: Oral antibiotics tomorrow, and consider discharge.  Consultants:  Orthopedics  Procedures:  Bursa aspiration 3/9  Antibiotics:  Vancomycin  HPI/Subjective: He denies any complaints this morning, he tolerated the bursa aspiration fine  Objective: Filed Vitals:   09/18/2012 2229 September 18, 2012 2300 09/03/12 0515 09/03/12 1349  BP: 115/72 125/73 128/75 139/87  Pulse: 101 90 84 84  Temp: 99.5 F (37.5 C) 98.6 F (37 C) 97.8 F (36.6 C) 97.7 F (36.5 C)  TempSrc: Oral Oral Oral Oral  Resp: 16 18 18 18   Height:  6\' 6"  (1.981 m)    Weight:  66.679 kg (147 lb)    SpO2: 97% 96% 98% 98%    Intake/Output Summary (Last 24 hours) at 09/03/12 1401 Last data filed at 09/03/12 0600  Gross per 24 hour  Intake   1115 ml  Output    300 ml  Net    815 ml   Filed Weights   Sep 18, 2012 2300  Weight: 66.679 kg (147 lb)    Exam:  General:  NAD  Cardiovascular: regular rate and rhythm, without MRG  Respiratory: good air movement, clear to auscultation  throughout, no wheezing, ronchi or rales  Abdomen: soft, not tender to palpation, positive bowel sounds  MSK: no peripheral edema, left elbow in ACE wrapping  Neuro: CN 2-12 grossly intact  Data Reviewed: Basic Metabolic Panel:  Recent Labs Lab Sep 18, 2012 1815  NA 137  K 3.7  CL 101  CO2 28  GLUCOSE 111*  BUN 10  CREATININE 0.65  CALCIUM 9.1   CBC:  Recent Labs Lab Sep 18, 2012 1815  WBC 8.5  NEUTROABS 6.4  HGB 13.9  HCT 40.0  MCV 93.2  PLT 296   Studies: Dg Elbow Complete Left  2012-09-18  *RADIOLOGY REPORT*  Clinical Data: Insect bite, cellulitis, left posterior elbow  LEFT ELBOW - COMPLETE 3+ VIEW  Comparison: 02/24/2011  Findings: No fracture or dislocation is seen.  Moderate degenerative changes with olecranon spurring.  No displaced elbow joint fat pads to suggest an elbow joint effusion.  Severe soft tissue swelling overlying the olecranon.  No radiopaque foreign body is seen.  IMPRESSION: Severe soft tissue swelling overlying the olecranon.  No radiopaque foreign body is seen.  Moderate degenerative changes.   Original Report Authenticated By: Charline Bills, M.D.     Scheduled Meds: . diclofenac sodium  2 g Topical BID  . docusate sodium  100 mg Oral QPM  . lidocaine      . lisinopril  10 mg Oral Daily  . multivitamin  1 tablet Oral Daily  . pantoprazole  40 mg Oral Daily  . rOPINIRole  0.25 mg Oral QHS  .  senna  1 tablet Oral QHS  . vancomycin  750 mg Intravenous Q8H   Continuous Infusions: . sodium chloride 125 mL (09/02/12 2330)    Principal Problem:   Septic olecranon bursitis Active Problems:   Chronic constipation   Sacral decubitus ulcer  Kevin Pert, MD [facility] Pager [phone]. If 7 PM - 7 AM, please contact night-coverage at [email or web address], password [facility] 09/03/2012, 2:01 PM  LOS: 1 day

## 2012-09-03 NOTE — Progress Notes (Signed)
ANTIBIOTIC CONSULT NOTE - INITIAL  Pharmacy Consult for vancomycin Indication: Septic olecranon bursitis   Allergies  Allergen Reactions  . Aspirin Other (See Comments)    Noted on MAR. Reaction unknown  . Ibuprofen Other (See Comments)    Noted on MAR. Reaction unknown    Patient Measurements: Height: 6\' 6"  (198.1 cm) Weight: 147 lb (66.679 kg) IBW/kg (Calculated) : 91.4 Adjusted Body Weight:   Vital Signs: Temp: 98.6 F (37 C) (03/08 2300) Temp src: Oral (03/08 2300) BP: 125/73 mmHg (03/08 2300) Pulse Rate: 90 (03/08 2300) Intake/Output from previous day:   Intake/Output from this shift:    Labs:  Recent Labs  09/02/12 1815  WBC 8.5  HGB 13.9  PLT 296  CREATININE 0.65   Estimated Creatinine Clearance: 93.8 ml/min (by C-G formula based on Cr of 0.65). No results found for this basename: VANCOTROUGH, VANCOPEAK, VANCORANDOM, GENTTROUGH, GENTPEAK, GENTRANDOM, TOBRATROUGH, TOBRAPEAK, TOBRARND, AMIKACINPEAK, AMIKACINTROU, AMIKACIN,  in the last 72 hours   Microbiology: No results found for this or any previous visit (from the past 720 hour(s)).  Medical History: Past Medical History  Diagnosis Date  . Hypertension   . Duodenal ulcer hemorrhage   . Hypertrophy of prostate   . Generalized muscle weakness   . Acute respiratory failure   . Cognitive communication deficit   . MRSA (methicillin resistant Staphylococcus aureus)   . Kidney stone     Medications:  Anti-infectives   Start     Dose/Rate Route Frequency Ordered Stop   09/03/12 0600  vancomycin (VANCOCIN) 750 mg in sodium chloride 0.9 % 150 mL IVPB     750 mg 150 mL/hr over 60 Minutes Intravenous Every 8 hours 09/03/12 0006     09/02/12 1800  vancomycin (VANCOCIN) IVPB 1000 mg/200 mL premix     1,000 mg 200 mL/hr over 60 Minutes Intravenous  Once 09/02/12 1740 09/02/12 2042     Assessment: Patient with Septic olecranon bursitis.  First dose of antibiotics already given in ED.    Goal of  Therapy:  Vancomycin trough level 15-20 mcg/ml  Plan:  Measure antibiotic drug levels at steady state Follow up culture results Vancomycin 750mg  iv q8hr  Darlina Guys, Jacquenette Shone Crowford 09/03/2012,12:07 AM

## 2012-09-03 NOTE — Consult Note (Signed)
Reason for Consult: Evaluate left elbow septic olecranon bursitis Referring Physician: Gwyneth Sprout M.D.  [name] is an 62 y.o. male.  HPI: The patient is a 62 year old wheelchair-bound male who has had a 3 to four-[name] history of left elbow swelling and pain. He presented to the emergency department last night where he was diagnosed with septic olecranon bursitis. The elbow was aspirated and he has been started on IV antibiotics. He tells me that it already feels better from last night. Pain is mild at this point. Worse with direct pressure. He has not been febrile and has a normal white count.  Past Medical History  Diagnosis Date  . Hypertension   . Duodenal ulcer hemorrhage   . Hypertrophy of prostate   . Generalized muscle weakness   . Acute respiratory failure   . Cognitive communication deficit   . MRSA (methicillin resistant Staphylococcus aureus)   . Kidney stone     Past Surgical History  Procedure Laterality Date  . Knee arthroscopy      right  . Total knee arthroplasty      right  . Wrist surgery      right  . Neck surgery    . Exploratory laparotomy  02/2011    Duodenotomy, oversew of gastric ulcer/duodenal artery  . Appendectomy    . Tibia fracture surgery      History reviewed. No pertinent family history.  Social History:  reports that he quit smoking about 17 months ago. He does not have any smokeless tobacco history on file. He reports that he does not drink alcohol or use illicit drugs.  Allergies:  Allergies  Allergen Reactions  . Aspirin Other (See Comments)    Noted on MAR. Reaction unknown  . Ibuprofen Other (See Comments)    Noted on MAR. Reaction unknown    Medications: I have reviewed the patient's current medications.  Results for orders placed during the hospital encounter of 09/02/12 (from the past 48 hour(s))  CBC WITH DIFFERENTIAL     Status: None   Collection Time    09/02/12  6:15 PM      Result Value Range   WBC 8.5  4.0  - 10.5 K/uL   RBC 4.29  4.22 - 5.81 MIL/uL   Hemoglobin 13.9  13.0 - 17.0 g/dL   HCT 91.4  78.2 - 95.6 %   MCV 93.2  78.0 - 100.0 fL   MCH 32.4  26.0 - 34.0 pg   MCHC 34.8  30.0 - 36.0 g/dL   RDW 21.3  08.6 - 57.8 %   Platelets 296  150 - 400 K/uL   Neutrophils Relative 75  43 - 77 %   Neutro Abs 6.4  1.7 - 7.7 K/uL   Lymphocytes Relative 16  12 - 46 %   Lymphs Abs 1.4  0.7 - 4.0 K/uL   Monocytes Relative 8  3 - 12 %   Monocytes Absolute 0.7  0.1 - 1.0 K/uL   Eosinophils Relative 0  0 - 5 %   Eosinophils Absolute 0.0  0.0 - 0.7 K/uL   Basophils Relative 0  0 - 1 %   Basophils Absolute 0.0  0.0 - 0.1 K/uL  BASIC METABOLIC PANEL     Status: Abnormal   Collection Time    09/02/12  6:15 PM      Result Value Range   Sodium 137  135 - 145 mEq/L   Potassium 3.7  3.5 - 5.1 mEq/L   Chloride  101  96 - 112 mEq/L   CO2 28  19 - 32 mEq/L   Glucose, Bld 111 (*) 70 - 99 mg/dL   BUN 10  6 - 23 mg/dL   Creatinine, Ser 1.61  0.50 - 1.35 mg/dL   Calcium 9.1  8.4 - 09.6 mg/dL   GFR calc non Af Amer >90  >90 mL/min   GFR calc Af Amer >90  >90 mL/min   Comment:            The eGFR has been calculated     using the CKD EPI equation.     This calculation has not been     validated in all clinical     situations.     eGFR's persistently     <90 mL/min signify     possible Chronic Kidney Disease.    Dg Elbow Complete Left  09/02/2012  *RADIOLOGY REPORT*  Clinical Data: Insect bite, cellulitis, left posterior elbow  LEFT ELBOW - COMPLETE 3+ VIEW  Comparison: 02/24/2011  Findings: No fracture or dislocation is seen.  Moderate degenerative changes with olecranon spurring.  No displaced elbow joint fat pads to suggest an elbow joint effusion.  Severe soft tissue swelling overlying the olecranon.  No radiopaque foreign body is seen.  IMPRESSION: Severe soft tissue swelling overlying the olecranon.  No radiopaque foreign body is seen.  Moderate degenerative changes.   Original Report Authenticated By:  Charline Bills, M.D.     Review of Systems  All other systems reviewed and are negative.   Blood pressure 128/75, pulse 84, temperature 97.8 F (36.6 C), temperature source Oral, resp. rate 18, height 6\' 6"  (1.981 m), weight 66.679 kg (147 lb), SpO2 98.00%. Physical Exam  Constitutional: He is oriented to person, place, and time. He appears well-developed.  HENT:  Head: Atraumatic.  Eyes: EOM are normal.  Cardiovascular: Intact distal pulses.   Respiratory: Effort normal.  Musculoskeletal:  Examination of the left elbow shows fluid in the olecranon bursa. Mild warmth and mild erythema. There is lines drawn from previous demarcation of cellulitis last night. I don't appreciate cellulitis in this region anymore. He has full elbow range of motion with only mild discomfort. Distally he is neurovascularly intact.  Neurological: He is alert and oriented to person, place, and time.  Skin: Skin is warm and dry.  Psychiatric: He has a normal mood and affect.    Assessment/Plan: Left elbow septic olecranon bursitis This can often be treated nonsurgically as long as it is adequately decompressed and has appropriate antibiotic coverage. Today there is still remaining fluid, and therefore I will re aspirate at the bedside to fully decompress. He is currently on vancomycin. This should cover most of the suspected organisms and can be narrowed once cultures return. I will resend cultures today in an effort to ensure identification of the bacteria.  Procedure: The left elbow was sterilely prepped with Betadine solution. 2 cc of 1% lidocaine without epinephrine were used with a 25-gauge needle to anesthetize the skin. An 18-gauge needle was then used through a posterior approach to aspirate the bursa. I was able to completely decompress the bursa and removed 11 cc of seropurulent material. A light sterile dressing with a compressive Ace bandage was applied.  He should be observed in the hospital  with IV antibiotics one more [name] and assuming things look better tomorrow morning he could likely be transitioned to an oral outpatient antibiotic regimen.  [name] 09/03/2012, 9:56 AM

## 2012-09-03 NOTE — Progress Notes (Signed)
Patient with stage II sacral ulcer that RN cleansed and applied Allevyn.  Upon arriving back into the room several hours later RN found Allevyn rolled up and in the floor.  I asked patient how this happened and he stated it was from "scooting" in the bed.  Patient noted to be very restless in the bed with frequent jerky  Movements.  Did have patient sit in the chair for some hours but then patient requested to be placed back in bed.  Will reapply Allevyn but doubt this will stay in place for long.  Pending wound care consult.

## 2012-09-04 DIAGNOSIS — M1990 Unspecified osteoarthritis, unspecified site: Secondary | ICD-10-CM

## 2012-09-04 DIAGNOSIS — G35 Multiple sclerosis: Secondary | ICD-10-CM

## 2012-09-04 MED ORDER — DICLOFENAC SODIUM 1 % TD GEL: 2.0000 g | Freq: Two times a day (BID) | TRANSDERMAL | Status: DC

## 2012-09-04 MED ORDER — HYDROCODONE-ACETAMINOPHEN 5-325 MG PO TABS: 1.0000 | ORAL_TABLET | ORAL | Status: DC | PRN

## 2012-09-04 MED ORDER — SULFAMETHOXAZOLE-TRIMETHOPRIM 800-160 MG PO TABS: 1.0000 | ORAL_TABLET | Freq: Two times a day (BID) | ORAL | Status: DC

## 2012-09-04 NOTE — Consult Note (Addendum)
WOC consult Note Reason for Consult: wound care sacrum, partial thickness pressure ulcer and left elbow abscess-managed by orthopedics. Wound type: Pressure vs. Moisture associated skin damage vs trauma  (Patient continues to state, "I fell".) Pressure Ulcer POA: Yes Measurement: 3cm x 2.5cm x .2cm  Wound ZOX:WRUE and witrhout necrotic tissue.  Moist. Drainage (amount, consistency, odor) scant amount of drainage on bedsheet.  No dressing on wound. Periwound:grey macerated tissue at  Wound periphery Dressing procedure/placement/frequency: I suggest a soft silicone foam dressing to absorb excess moisture, provide some padding to the ulceration and to allow for tissue repair.  Tory Emerald [phone]. I will not follow, but will remain available to this gentleman, his nursing and medical teams.  Please re-consult if needed. Thanks, Ladona Mow, MSN, RN, [facility], CWOCN [phone])

## 2012-09-04 NOTE — Progress Notes (Signed)
PATIENT ID: [name]        Subjective: Doing well today. Reports pain is much improved since p[rior to admission, feels "90%" better. Denies pain in elbow. Denies symptoms of fever, chills, malaise. No other complaints or concerns.  Objective:  Filed Vitals:   09/04/12 0600  BP: 129/87  Pulse: 80  Temp: 98.3 F (36.8 C)  Resp: 20      L UE no appreciable erythema, notable swelling of the elbow consistant with fluid of olecranon bursa. nontender to palpation Full ROM of L elbow with out pain Distally NVI ACE reapplied   Labs:   Recent Labs  09/02/12 1815  HGB 13.9   Recent Labs  09/02/12 1815  WBC 8.5  RBC 4.29  HCT 40.0  PLT 296   Recent Labs  09/02/12 1815  NA 137  K 3.7  CL 101  CO2 28  BUN 10  CREATININE 0.65  GLUCOSE 111*  CALCIUM 9.1    Assessment and Plan: Left elbow olecranon bursitis s/p decompression and aspiration No pain in elbow, no sign or symptoms of systemic infection Cultures speciated to staph aureus, abx per primary team Could likely be transitioned to oral outpatient abx regimen Cont with compression via ACE wrap Fu with Dr. Ave Filter 1 week to assess response to abx

## 2012-09-04 NOTE — Progress Notes (Signed)
Clinical Social Work Department BRIEF PSYCHOSOCIAL ASSESSMENT 09/04/2012  Patient:  [name], [name]     Account Number:  [id number]     Admit date:  09/02/2012  Clinical Social Worker:  Dennison Bulla  Date/Time:  09/04/2012 09:15 AM  Referred by:  Physician  Date Referred:  09/04/2012 Referred for  ALF Placement   Other Referral:   Interview type:  Patient Other interview type:    PSYCHOSOCIAL DATA Living Status:  FACILITY Admitted from facility:  [name] Level of care:  Assisted Living Primary support name:  Pearline Primary support relationship to patient:  SIBLING Degree of support available:   Adequate    CURRENT CONCERNS Current Concerns  Post-Acute Placement   Other Concerns:    SOCIAL WORK ASSESSMENT / PLAN CSW received referral due to patient being admitted from a facility. CSW reviewed chart and met with patient at bedside. No visitors present.    CSW introduced myself and explained role. Patient reports he has been living at [facility] for about 3 months. Patient reports he enjoys facility and wants to return at dc. Patient reports that he is basically wheelchair bound and facility helps with dressing and bathing him. Patient reports that family is not aware that he is in the hospital and patient asks that CSW not call them because he does not want them to worry about him. CSW explained dc process and patient agreeable to plans.    CSW spoke with ALF who is agreeable to accept patient back at dc. CSW completed FL2 and will update FL2 prior to dc. CSW will continue to follow.   Assessment/plan status:  Psychosocial Support/Ongoing Assessment of Needs Other assessment/ plan:   Information/referral to community resources:   Will return back to ALF    PATIENT'S/FAMILY'S RESPONSE TO PLAN OF CARE: Patient alert and oriented. Patient pleasant and engaged throughout assessment. Patient does not desire family involvement at this time.

## 2012-09-04 NOTE — Progress Notes (Signed)
Patient to be discharged back to [facility].  Patient is awake, alert and oriented x3 and is agreeable to transfer.  All belongings packed up and with patient.  IV removed from right forearm.  Report given to [place] at [facility].  No further questions at this time.  Awaiting for ambulance pick up.  Will monitor.

## 2012-09-04 NOTE — Progress Notes (Signed)
Clinical Social Work  MD dc patient to ALF. CSW sent dc summary and FL2 to [facility]. [facility] and facility agreeable to admission. CSW informed patient of dc and patient prefers [facility] to transport him. CSW offered to call family but patient reports he will call if he wants to. CSW prepared dc packet with dc summary, FL2 and hard scripts. RN agreeable to dc. CSW coordinated transportation via [place]. CSW is signing off.  [place], [place] [phone]

## 2012-09-04 NOTE — Discharge Summary (Signed)
Physician Discharge Summary  Eugean Arnott UXL:[id number] DOB: 1950/09/04 DOA: 09/02/2012  PCP: No primary Paiten Boies on file.  Admit date: 09/02/2012 Discharge date: 09/04/2012  Time spent: >35 minutes  Recommendations for Outpatient Follow-up:  1. F/u with Dr. Ave Filter your orthopaedic surgeon in 1 week  Discharge Diagnoses:  Principal Problem:   Septic olecranon bursitis Active Problems:   Chronic constipation   Sacral decubitus ulcer   Discharge Condition: stable  Diet recommendation: heart healthy  Filed Weights   09/02/12 2300  Weight: 66.679 kg (147 lb)    History of present illness:  62 y/o with h/o sacral decubitus ulcer and chronic constipation.  Presented with left elbow swelling and pain.  Admitted and treated for septic olecranon bursitis.  Hospital Course:  1. Septic olecranon bursitis - orthopedics evaluated patient and patient is s/p left elbow olecranon bursitis s/p decompression and aspiration.  Cultures speciated to Staph aureus.  Will transition to oral bactrim for 10 days. Pt to f/u with Dr. Ave Filter with ortho in 1 week. 2. Sacral ulcer - continue frequent repositioning and routine wound care at home.  3. Chronic constipation - continue bowel regimen   Procedures:  Please see above  Consultations:  Orthopaedic surgery  Discharge Exam: Filed Vitals:   09/03/12 0515 09/03/12 1349 09/03/12 2200 09/04/12 0600  BP: 128/75 139/87 134/80 129/87  Pulse: 84 84 96 80  Temp: 97.8 F (36.6 C) 97.7 F (36.5 C) 99.4 F (37.4 C) 98.3 F (36.8 C)  TempSrc: Oral Oral Oral Oral  Resp: 18 18 20 20   Height:      Weight:      SpO2: 98% 98% 97% 96%    General: Pt in NAD, alert and awake Cardiovascular: RRR, No MRG Respiratory: CTA BL, no wheezes  Discharge Instructions  Discharge Orders   Future Orders Complete By Expires     Call MD for:  redness, tenderness, or signs of infection (pain, swelling, redness, odor or green/yellow discharge around incision  site)  As directed     Call MD for:  severe uncontrolled pain  As directed     Call MD for:  temperature >100.4  As directed     Diet - low sodium heart healthy  As directed     Discharge instructions  As directed     Comments:      Please be sure to follow up with Dr. Ave Filter in one week  Cont with compression via ACE wrap    Increase activity slowly  As directed         Medication List    STOP taking these medications       HYDROcodone-acetaminophen 5-500 MG per tablet  Commonly known as:  VICODIN      TAKE these medications       acetaminophen 500 MG tablet  Commonly known as:  TYLENOL  Take 1,000 mg by mouth at bedtime. For pain     diclofenac sodium 1 % Gel  Commonly known as:  VOLTAREN  Apply 2 g topically 2 (two) times daily.     docusate sodium 100 MG capsule  Commonly known as:  COLACE  Take 100 mg by mouth every evening.     HYDROcodone-acetaminophen 5-325 MG per tablet  Commonly known as:  NORCO/VICODIN  Take 1-2 tablets by mouth every 4 (four) hours as needed.     lisinopril 10 MG tablet  Commonly known as:  PRINIVIL,ZESTRIL  Take 10 mg by mouth daily.     magnesium oxide  400 MG tablet  Commonly known as:  MAG-OX  Take 400 mg by mouth at bedtime. For leg cramps     Melatonin 3 MG Caps  Take 1 capsule by mouth at bedtime.     multivitamin with minerals Tabs  Take 1 tablet by mouth daily.     omeprazole 20 MG capsule  Commonly known as:  PRILOSEC  Take 20 mg by mouth daily.     rOPINIRole 0.25 MG tablet  Commonly known as:  REQUIP  Take 0.25 mg by mouth at bedtime.     senna 8.6 MG Tabs  Commonly known as:  SENOKOT  Take 1 tablet by mouth at bedtime.     sulfamethoxazole-trimethoprim 800-160 MG per tablet  Commonly known as:  BACTRIM DS  Take 1 tablet by mouth 2 (two) times daily.           Follow-up Information   Follow up with [name], MD. Schedule an appointment as soon as possible for a visit in 1 week.    Contact information:   [address] [place] [address] [phone]        The results of significant diagnostics from this hospitalization (including imaging, microbiology, ancillary and laboratory) are listed below for reference.    Significant Diagnostic Studies: Dg Sacrum/coccyx  08/18/2012  *RADIOLOGY REPORT*  Clinical Data: History of fall with pain in the region of the coccyx.  SACRUM AND COCCYX - 2+ VIEW  Comparison: No priors.  Findings: AP and lateral views of the sacrum and coccyx demonstrate no definite acute displaced fractures.  Visualized portions of the remaining pelvis also show no acute displaced fractures, but demonstrates moderate to severe bilateral hip joint osteoarthritis.  IMPRESSION: 1.  No acute radiographic abnormality of the sacrum or coccyx.   Original Report Authenticated By: Trudie Reed, M.D.    Dg Elbow Complete Left  09/02/2012  *RADIOLOGY REPORT*  Clinical Data: Insect bite, cellulitis, left posterior elbow  LEFT ELBOW - COMPLETE 3+ VIEW  Comparison: 02/24/2011  Findings: No fracture or dislocation is seen.  Moderate degenerative changes with olecranon spurring.  No displaced elbow joint fat pads to suggest an elbow joint effusion.  Severe soft tissue swelling overlying the olecranon.  No radiopaque foreign body is seen.  IMPRESSION: Severe soft tissue swelling overlying the olecranon.  No radiopaque foreign body is seen.  Moderate degenerative changes.   Original Report Authenticated By: Charline Bills, M.D.    Dg Hip Complete Left  08/18/2012  *RADIOLOGY REPORT*  Clinical Data: History of fall 2 days ago complaining of left-sided hip pain.  LEFT HIP - COMPLETE 2+ VIEW  Comparison: No priors.  Findings: AP view of the pelvis and AP and lateral views of the left hip demonstrate no acute displaced fracture, subluxation, dislocation, joint or soft tissue abnormality.  The hip joints bilaterally and there is joint space narrowing, subchondral  sclerosis, subchondral cyst formation and osteophyte formation, compatible with moderate to severe osteoarthritis.  IMPRESSION: 1.  No acute radiographic abnormality of the bony pelvis or the left hip. 2.  Moderate to severe bilateral hip joint osteoarthritis.   Original Report Authenticated By: Trudie Reed, M.D.     Microbiology: Recent Results (from the past 240 hour(s))  BODY FLUID CULTURE     Status: None   Collection Time    09/02/12  9:07 PM      Result Value Range Status   Specimen Description ARM LEFT ELBOW   Final   Special Requests  NONE   Final   Gram Stain     Final   Value: WBC PRESENT, PREDOMINANTLY PMN     GRAM POSITIVE COCCI IN PAIRS     IN CLUSTERS   Culture Culture reincubated for better growth   Final   Report Status PENDING   Incomplete  BODY FLUID CULTURE     Status: None   Collection Time    09/03/12  9:42 AM      Result Value Range Status   Specimen Description OTHER ELBOW, OLECRANON BURSITIS   Final   Special Requests Normal   Final   Gram Stain PENDING   Incomplete   Culture MODERATE STAPHYLOCOCCUS AUREUS   Final   Report Status PENDING   Incomplete     Labs: Basic Metabolic Panel:  Recent Labs Lab 09/02/12 1815  NA 137  K 3.7  CL 101  CO2 28  GLUCOSE 111*  BUN 10  CREATININE 0.65  CALCIUM 9.1   Liver Function Tests: No results found for this basename: AST, ALT, ALKPHOS, BILITOT, PROT, ALBUMIN,  in the last 168 hours No results found for this basename: LIPASE, AMYLASE,  in the last 168 hours No results found for this basename: AMMONIA,  in the last 168 hours CBC:  Recent Labs Lab 09/02/12 1815  WBC 8.5  NEUTROABS 6.4  HGB 13.9  HCT 40.0  MCV 93.2  PLT 296   Cardiac Enzymes: No results found for this basename: CKTOTAL, CKMB, CKMBINDEX, TROPONINI,  in the last 168 hours BNP: BNP (last 3 results) No results found for this basename: PROBNP,  in the last 8760 hours CBG: No results found for this basename: GLUCAP,  in the last 168  hours     Signed:  Penny Pia  [facility] 09/04/2012, 12:52 PM

## 2012-09-05 LAB — BODY FLUID CULTURE: Special Requests: NORMAL

## 2012-09-07 ENCOUNTER — Inpatient Hospital Stay (HOSPITAL_COMMUNITY)
Admission: EM | Admit: 2012-09-07 | Discharge: 2012-09-11 | DRG: 854 | Disposition: A | Discharge status: Nursing Home (Includes ICF) | Payer: Medicaid Other | Attending: Internal Medicine | Admitting: Internal Medicine

## 2012-09-07 ENCOUNTER — Encounter (HOSPITAL_COMMUNITY): Payer: Self-pay | Admitting: *Deleted

## 2012-09-07 DIAGNOSIS — Z87891 Personal history of nicotine dependence: Secondary | ICD-10-CM

## 2012-09-07 DIAGNOSIS — M7020 Olecranon bursitis, unspecified elbow: Secondary | ICD-10-CM | POA: Diagnosis present

## 2012-09-07 DIAGNOSIS — I10 Essential (primary) hypertension: Secondary | ICD-10-CM | POA: Diagnosis present

## 2012-09-07 DIAGNOSIS — L89152 Pressure ulcer of sacral region, stage 2: Secondary | ICD-10-CM

## 2012-09-07 DIAGNOSIS — L89109 Pressure ulcer of unspecified part of back, unspecified stage: Secondary | ICD-10-CM | POA: Diagnosis present

## 2012-09-07 DIAGNOSIS — A4102 Sepsis due to Methicillin resistant Staphylococcus aureus: Principal | ICD-10-CM | POA: Diagnosis present

## 2012-09-07 DIAGNOSIS — Z79899 Other long term (current) drug therapy: Secondary | ICD-10-CM

## 2012-09-07 DIAGNOSIS — L89159 Pressure ulcer of sacral region, unspecified stage: Secondary | ICD-10-CM | POA: Diagnosis present

## 2012-09-07 DIAGNOSIS — A419 Sepsis, unspecified organism: Secondary | ICD-10-CM | POA: Diagnosis present

## 2012-09-07 DIAGNOSIS — K219 Gastro-esophageal reflux disease without esophagitis: Secondary | ICD-10-CM | POA: Diagnosis present

## 2012-09-07 DIAGNOSIS — Z681 Body mass index (BMI) 19 or less, adult: Secondary | ICD-10-CM

## 2012-09-07 DIAGNOSIS — Z8719 Personal history of other diseases of the digestive system: Secondary | ICD-10-CM

## 2012-09-07 DIAGNOSIS — M71129 Other infective bursitis, unspecified elbow: Secondary | ICD-10-CM | POA: Diagnosis present

## 2012-09-07 DIAGNOSIS — M1990 Unspecified osteoarthritis, unspecified site: Secondary | ICD-10-CM

## 2012-09-07 DIAGNOSIS — M71122 Other infective bursitis, left elbow: Secondary | ICD-10-CM

## 2012-09-07 DIAGNOSIS — K5909 Other constipation: Secondary | ICD-10-CM

## 2012-09-07 DIAGNOSIS — G35 Multiple sclerosis: Secondary | ICD-10-CM

## 2012-09-07 DIAGNOSIS — L8992 Pressure ulcer of unspecified site, stage 2: Secondary | ICD-10-CM | POA: Diagnosis present

## 2012-09-07 DIAGNOSIS — E46 Unspecified protein-calorie malnutrition: Secondary | ICD-10-CM | POA: Diagnosis present

## 2012-09-07 LAB — CBC WITH DIFFERENTIAL/PLATELET
Basophils Absolute: 0 10*3/uL (ref 0.0–0.1)
Basophils Absolute: 0 10*3/uL (ref 0.0–0.1)
Basophils Relative: 0 % (ref 0–1)
Basophils Relative: 0 % (ref 0–1)
Eosinophils Absolute: 0 10*3/uL (ref 0.0–0.7)
Eosinophils Absolute: 0 10*3/uL (ref 0.0–0.7)
Eosinophils Relative: 1 % (ref 0–5)
HCT: 39 % (ref 39.0–52.0)
Hemoglobin: 13.5 g/dL (ref 13.0–17.0)
Hemoglobin: 13.2 g/dL (ref 13.0–17.0)
MCH: 32.1 pg (ref 26.0–34.0)
MCH: 32.2 pg (ref 26.0–34.0)
MCHC: 34.6 g/dL (ref 30.0–36.0)
MCHC: 34.8 g/dL (ref 30.0–36.0)
MCV: 92.9 fL (ref 78.0–100.0)
Monocytes Absolute: 0.5 10*3/uL (ref 0.1–1.0)
Monocytes Relative: 9 % (ref 3–12)
Monocytes Relative: 11 % (ref 3–12)
Neutro Abs: 3.2 10*3/uL (ref 1.7–7.7)
Neutro Abs: 2.9 10*3/uL (ref 1.7–7.7)
Neutrophils Relative %: 57 % (ref 43–77)
RDW: 12.2 % (ref 11.5–15.5)
RDW: 12.1 % (ref 11.5–15.5)

## 2012-09-07 LAB — BASIC METABOLIC PANEL
BUN: 9 mg/dL (ref 6–23)
CO2: 27 mEq/L (ref 19–32)
Calcium: 8.8 mg/dL (ref 8.4–10.5)
Creatinine, Ser: 0.73 mg/dL (ref 0.50–1.35)
GFR calc non Af Amer: >90 mL/min (ref >90)
Glucose, Bld: 88 mg/dL (ref 70–99)
Sodium: 138 mEq/L (ref 135–145)

## 2012-09-07 MED ORDER — HYDROCODONE-ACETAMINOPHEN 5-325 MG PO TABS
1.0000 | ORAL_TABLET | ORAL | Status: DC | PRN
  Administered 2012-09-07 – 2012-09-11 (×6): 2 via ORAL
  Filled 2012-09-07 (×6): qty 2

## 2012-09-07 MED ORDER — SODIUM CHLORIDE 0.9 % IV SOLN
750.0000 mg | Freq: Three times a day (TID) | INTRAVENOUS | Status: DC
  Administered 2012-09-07 – 2012-09-11 (×11): 750 mg via INTRAVENOUS
  Filled 2012-09-07 (×14): qty 750

## 2012-09-07 MED ORDER — MORPHINE SULFATE 4 MG/ML IJ SOLN
4.0000 mg | Freq: Once | INTRAMUSCULAR | Status: AC
  Administered 2012-09-07: 4 mg via INTRAVENOUS
  Filled 2012-09-07: qty 1

## 2012-09-07 MED ORDER — ONDANSETRON HCL 4 MG/2ML IJ SOLN
4.0000 mg | Freq: Once | INTRAMUSCULAR | Status: AC
  Administered 2012-09-07: 4 mg via INTRAVENOUS
  Filled 2012-09-07: qty 2

## 2012-09-07 NOTE — ED Notes (Signed)
Per EMS, pt from [facility] with reports of "bursitis" to left elbow that pt was treated for a few days ago but with no improvement.

## 2012-09-07 NOTE — H&P (Signed)
[facility] History and Physical  Abdulkadir Emmanuel ZOX:[id number] DOB: 21-Nov-1950 DOA: 09/07/2012  Referring physician: Dr. Franky Macho. PCP: No primary provider on file.  Specialists: None.  Chief Complaint: Left elbow swelling.  HPI: [name] is a 62 y.o. male who was recently admitted for septic olecranon bursitis and at that time patient had drainage done by the orthopedic surgeon presents with worsening swelling at this time with pain. Orthopedic on call Dr. Frederik Pear was consulted by ER physician and they will be seeing patient in consult. Patient when admitted last time had the joint aspirated and MRSA grew. At this time we have placed patient on IV vancomycin. Patient otherwise denies any chest pain shortness of breath fever chills.  Review of Systems: As presented in the history of presenting illness nothing else significant.  Past Medical History  Diagnosis Date  . Hypertension   . Duodenal ulcer hemorrhage   . Hypertrophy of prostate   . Generalized muscle weakness   . Acute respiratory failure   . Cognitive communication deficit   . MRSA (methicillin resistant Staphylococcus aureus)   . Kidney stone    Past Surgical History  Procedure Laterality Date  . Knee arthroscopy      right  . Total knee arthroplasty      right  . Wrist surgery      right  . Neck surgery    . Exploratory laparotomy  02/2011    Duodenotomy, oversew of gastric ulcer/duodenal artery  . Appendectomy    . Tibia fracture surgery     Social History:  reports that he quit smoking about 18 months ago. He has never used smokeless tobacco. He reports that he does not drink alcohol or use illicit drugs. Lives at nursing home. where does patient live-- Can do ADLs. Can patient participate in ADLs?  Allergies  Allergen Reactions  . Aspirin Other (See Comments)    Noted on MAR. Reaction unknown  . Ibuprofen Other (See Comments)    Noted on MAR. Reaction unknown    Family History  Problem  Relation Age of Onset  . Hypertension Father       Prior to Admission medications   Medication Sig Start Date End Date Taking? Authorizing Provider  acetaminophen (TYLENOL) 500 MG tablet Take 1,000 mg by mouth at bedtime. For pain   Yes Historical Provider, MD  diclofenac sodium (VOLTAREN) 1 % GEL Apply 2 g topically 2 (two) times daily. 09/04/12  Yes Penny Pia, MD  docusate sodium (COLACE) 100 MG capsule Take 100 mg by mouth every evening.    Yes Historical Provider, MD  lisinopril (PRINIVIL,ZESTRIL) 10 MG tablet Take 10 mg by mouth daily.    Yes Historical Provider, MD  magnesium oxide (MAG-OX) 400 MG tablet Take 400 mg by mouth at bedtime. For leg cramps   Yes Historical Provider, MD  Melatonin 3 MG CAPS Take 1 capsule by mouth at bedtime.   Yes Historical Provider, MD  Multiple Vitamin (MULTIVITAMIN WITH MINERALS) TABS Take 1 tablet by mouth daily.   Yes Historical Provider, MD  omeprazole (PRILOSEC) 20 MG capsule Take 20 mg by mouth daily.    Yes Historical Provider, MD  rOPINIRole (REQUIP) 0.25 MG tablet Take 0.25 mg by mouth at bedtime.   Yes Historical Provider, MD  senna (SENOKOT) 8.6 MG TABS Take 1 tablet by mouth at bedtime.    Yes Historical Provider, MD  sulfamethoxazole-trimethoprim (BACTRIM DS) 800-160 MG per tablet Take 1 tablet by mouth 2 (two) times daily. 09/04/12  Yes Penny Pia, MD  HYDROcodone-acetaminophen (NORCO/VICODIN) 5-325 MG per tablet Take 1-2 tablets by mouth every 4 (four) hours as needed for pain. 09/04/12   Penny Pia, MD   Physical Exam: Filed Vitals:   09/07/12 1815  BP: 128/82  Pulse: 74  Temp: 98.6 F (37 C)  TempSrc: Oral  Resp: 18  SpO2: 100%     General:  Well-developed well-nourished.  Eyes: Anicteric no pallor.  ENT: No discharge from the ears eyes nose or mouth.  Neck: No mass felt.  Cardiovascular: S1-S2 heard.  Respiratory: No rhonchi no crepitations.  Abdomen: Soft nontender bowel sounds present.  Skin: No rash. Sacral  decubitus stage 2-3. No active discharge.  Musculoskeletal: Swelling of the left elbow around the olecranon bursa. No active discharge seen.  Psychiatric: Appears normal.  Neurologic: Alert awake oriented to time place and person moves all extremities.  Labs on Admission:  Basic Metabolic Panel:  Recent Labs Lab 09/02/12 1815 09/07/12 1906  NA 137 138  K 3.7 4.1  CL 101 104  CO2 28 27  GLUCOSE 111* 88  BUN 10 9  CREATININE 0.65 0.73  CALCIUM 9.1 8.8   Liver Function Tests: No results found for this basename: AST, ALT, ALKPHOS, BILITOT, PROT, ALBUMIN,  in the last 168 hours No results found for this basename: LIPASE, AMYLASE,  in the last 168 hours No results found for this basename: AMMONIA,  in the last 168 hours CBC:  Recent Labs Lab 09/02/12 1815 09/07/12 1954 09/07/12 2000  WBC 8.5 5.0 5.4  NEUTROABS 6.4 2.9 3.2  HGB 13.9 13.2 13.5  HCT 40.0 37.9* 39.0  MCV 93.2 92.4 92.9  PLT 296 331 322   Cardiac Enzymes: No results found for this basename: CKTOTAL, CKMB, CKMBINDEX, TROPONINI,  in the last 168 hours  BNP (last 3 results) No results found for this basename: PROBNP,  in the last 8760 hours CBG: No results found for this basename: GLUCAP,  in the last 168 hours  Radiological Exams on Admission: No results found.   Assessment/Plan Active Problems:   Septic olecranon bursitis   Sacral decubitus ulcer   Hypertension   1. Septic olecranon bursitis - patient will be kept n.p.o. past midnight in anticipation of possible surgery. Orthopedic surgeon has been consulted. Patient will be continued on IV vancomycin as patient's recent aspiration grew MRSA. 2. Hypertension - continue present medications. 3. 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    Code Status: Full code.  Family Communication: None.  Disposition Plan: Admit for inpatient.   KAKRAKANDY,ARSHAD N. [facility] Pager [phone].  If 7PM-7AM, please contact  night-coverage [email or web address] Password [facility] 09/07/2012, 9:38 PM

## 2012-09-07 NOTE — Progress Notes (Signed)
ANTIBIOTIC CONSULT NOTE - INITIAL  Pharmacy Consult for Vancomycin  Indication: L elbow bursitis  Allergies  Allergen Reactions  . Aspirin Other (See Comments)    Noted on MAR. Reaction unknown  . Ibuprofen Other (See Comments)    Noted on MAR. Reaction unknown   Patient Measurements:   TBW 66.7 kg  Vital Signs: Temp: 98.6 F (37 C) (03/13 1815) Temp src: Oral (03/13 1815) BP: 128/82 mmHg (03/13 1815) Pulse Rate: 74 (03/13 1815) Intake/Output from previous day:   Intake/Output from this shift:    Labs: No results found for this basename: WBC, HGB, PLT, LABCREA, CREATININE,  in the last 72 hours The CrCl is unknown because both a height and weight (above a minimum accepted value) are required for this calculation. No results found for this basename: VANCOTROUGH, VANCOPEAK, VANCORANDOM, GENTTROUGH, GENTPEAK, GENTRANDOM, TOBRATROUGH, TOBRAPEAK, TOBRARND, AMIKACINPEAK, AMIKACINTROU, AMIKACIN,  in the last 72 hours   Microbiology: Recent Results (from the past 720 hour(s))  BODY FLUID CULTURE     Status: None   Collection Time    09/02/12  9:07 PM      Result Value Range Status   Specimen Description ARM LEFT ELBOW   Final   Special Requests NONE   Final   Gram Stain     Final   Value: WBC PRESENT, PREDOMINANTLY PMN     GRAM POSITIVE COCCI IN PAIRS     IN CLUSTERS   Culture     Final   Value: MODERATE METHICILLIN RESISTANT STAPHYLOCOCCUS AUREUS     Note: RIFAMPIN AND GENTAMICIN SHOULD NOT BE USED AS SINGLE DRUGS FOR TREATMENT OF STAPH INFECTIONS. This organism is presumed to be Clindamycin resistant based on detection of inducible Clindamycin resistance.   Report Status 09/05/2012 FINAL   Final   Organism ID, Bacteria METHICILLIN RESISTANT STAPHYLOCOCCUS AUREUS   Final  BODY FLUID CULTURE     Status: None   Collection Time    09/03/12  9:42 AM      Result Value Range Status   Specimen Description OTHER ELBOW, OLECRANON BURSITIS   Final   Special Requests Normal    Final   Gram Stain     Final   Value: FEW WBC PRESENT,BOTH PMN AND MONONUCLEAR     NO ORGANISMS SEEN   Culture     Final   Value: MODERATE METHICILLIN RESISTANT STAPHYLOCOCCUS AUREUS     Note: RIFAMPIN AND GENTAMICIN SHOULD NOT BE USED AS SINGLE DRUGS FOR TREATMENT OF STAPH INFECTIONS. This organism is presumed to be Clindamycin resistant based on detection of inducible Clindamycin resistance.   Report Status 09/05/2012 FINAL   Final   Organism ID, Bacteria METHICILLIN RESISTANT STAPHYLOCOCCUS AUREUS   Final   Medical History: Past Medical History  Diagnosis Date  . Hypertension   . Duodenal ulcer hemorrhage   . Hypertrophy of prostate   . Generalized muscle weakness   . Acute respiratory failure   . Cognitive communication deficit   . MRSA (methicillin resistant Staphylococcus aureus)   . Kidney stone    Medications:  Anti-infectives   Start     Dose/Rate Route Frequency Ordered Stop   09/07/12 2000  vancomycin (VANCOCIN) 750 mg in sodium chloride 0.9 % 150 mL IVPB     750 mg 150 mL/hr over 60 Minutes Intravenous Every 8 hours 09/07/12 1932       Assessment: 49 yoM recently admitted 3/8-3/10 for L elbow bursitis. Discharged home on Septra therapy x 10 days. Elbow aspirated: cx  grew MRSA. Previous Vanc dose 750mg  q8h provided a trough of 11.4  Resume Vanc 750mg  q8h  Blood cx ordered this admission.  Goal of Therapy:  Vancomycin trough level 10-15 mcg/ml  Plan:  Vancomycin 750mg  q8h Follow cultures, trough as necessary.  Otho Bellows PharmD Pager [phone] 09/07/2012, 7:41 PM

## 2012-09-07 NOTE — ED Notes (Signed)
Pt states that he was here about 4 days ago with the same c/o left elbow pain. He thinks that he may have been bitten by a spider but is unsure. His left elbow is swollen with a small stage II present on admission. LUE is weak but he still is able to use/ move the extremity.

## 2012-09-07 NOTE — Consult Note (Signed)
Reason for Consult:   Left elbow olec bursa infection Referring Physician:    EDP  Chetan Mehring is an 62 y.o. male  Nursing home resident who was in the hospital recently with a swollen left elbow olecranon bursitis.Underwent aspiration by my partner, Dr Ave Filter, and received a course of IV vanco and was discharged on Bactrim to which his cultures were sensitive.  Unfortunately has not done well and elbow continues to drain and stay swollen.  Has become more painful to him and he presented to ED again today.    Past Medical History  Diagnosis Date  . Hypertension   . Duodenal ulcer hemorrhage   . Hypertrophy of prostate   . Generalized muscle weakness   . Acute respiratory failure   . Cognitive communication deficit   . MRSA (methicillin resistant Staphylococcus aureus)   . Kidney stone     Past Surgical History  Procedure Laterality Date  . Knee arthroscopy      right  . Total knee arthroplasty      right  . Wrist surgery      right  . Neck surgery    . Exploratory laparotomy  02/2011    Duodenotomy, oversew of gastric ulcer/duodenal artery  . Appendectomy    . Tibia fracture surgery      Family History  Problem Relation Age of Onset  . Hypertension Father     Social History:  reports that he quit smoking about 18 months ago. He has never used smokeless tobacco. He reports that he does not drink alcohol or use illicit drugs.  Allergies:  Allergies  Allergen Reactions  . Aspirin Other (See Comments)    Noted on MAR. Reaction unknown  . Ibuprofen Other (See Comments)    Noted on MAR. Reaction unknown    Medications: I have reviewed the patient's current medications.  Results for orders placed during the hospital encounter of 09/07/12 (from the past 48 hour(s))  BASIC METABOLIC PANEL     Status: None   Collection Time    09/07/12  7:06 PM      Result Value Range   Sodium 138  135 - 145 mEq/L   Potassium 4.1  3.5 - 5.1 mEq/L   Chloride 104  96 - 112 mEq/L   CO2 27  19 - 32 mEq/L   Glucose, Bld 88  70 - 99 mg/dL   BUN 9  6 - 23 mg/dL   Creatinine, Ser 1.61  0.50 - 1.35 mg/dL   Calcium 8.8  8.4 - 09.6 mg/dL   GFR calc non Af Amer >90  >90 mL/min   GFR calc Af Amer >90  >90 mL/min   Comment:            The eGFR has been calculated     using the CKD EPI equation.     This calculation has not been     validated in all clinical     situations.     eGFR's persistently     <90 mL/min signify     possible Chronic Kidney Disease.  CBC WITH DIFFERENTIAL     Status: Abnormal   Collection Time    09/07/12  7:54 PM      Result Value Range   WBC 5.0  4.0 - 10.5 K/uL   RBC 4.10 (*) 4.22 - 5.81 MIL/uL   Hemoglobin 13.2  13.0 - 17.0 g/dL   HCT 04.5 (*) 40.9 - 81.1 %   MCV 92.4  78.0 -  100.0 fL   MCH 32.2  26.0 - 34.0 pg   MCHC 34.8  30.0 - 36.0 g/dL   RDW 47.8  29.5 - 62.1 %   Platelets 331  150 - 400 K/uL   Neutrophils Relative 57  43 - 77 %   Neutro Abs 2.9  1.7 - 7.7 K/uL   Lymphocytes Relative 30  12 - 46 %   Lymphs Abs 1.5  0.7 - 4.0 K/uL   Monocytes Relative 11  3 - 12 %   Monocytes Absolute 0.6  0.1 - 1.0 K/uL   Eosinophils Relative 1  0 - 5 %   Eosinophils Absolute 0.0  0.0 - 0.7 K/uL   Basophils Relative 0  0 - 1 %   Basophils Absolute 0.0  0.0 - 0.1 K/uL  CBC WITH DIFFERENTIAL     Status: Abnormal   Collection Time    09/07/12  8:00 PM      Result Value Range   WBC 5.4  4.0 - 10.5 K/uL   RBC 4.20 (*) 4.22 - 5.81 MIL/uL   Hemoglobin 13.5  13.0 - 17.0 g/dL   HCT 30.8  65.7 - 84.6 %   MCV 92.9  78.0 - 100.0 fL   MCH 32.1  26.0 - 34.0 pg   MCHC 34.6  30.0 - 36.0 g/dL   RDW 96.2  95.2 - 84.1 %   Platelets 322  150 - 400 K/uL   Neutrophils Relative 60  43 - 77 %   Neutro Abs 3.2  1.7 - 7.7 K/uL   Lymphocytes Relative 30  12 - 46 %   Lymphs Abs 1.6  0.7 - 4.0 K/uL   Monocytes Relative 9  3 - 12 %   Monocytes Absolute 0.5  0.1 - 1.0 K/uL   Eosinophils Relative 1  0 - 5 %   Eosinophils Absolute 0.0  0.0 - 0.7 K/uL   Basophils  Relative 0  0 - 1 %   Basophils Absolute 0.0  0.0 - 0.1 K/uL    No results found.  @ROS @ Blood pressure 128/82, pulse 74, temperature 98.6 F (37 C), temperature source Oral, resp. rate 18, SpO2 100.00%.  PHYSICAL EXAM:   ABD soft Neurovascular intact Sensation intact distally Intact pulses distally Compartment soft  Left olec bursa is swollen to the size of a tennis ball.  Small area of drainage at tip.  Red and warm ROM is full. Other elbow appears normal  ASSESSMENT:   Left elbow infected olecranon bursa  PLAN:   Has failed aspiration and IV followed by PO antibiotics.  Needs I&D and will plan on excision of bursa with closure over drains tomorrow afternoon.  Will be admitted to medical service for IV vanco and will likely need to be on this for at least a few days after surgery as well.  Discussed risks and benefits of surgery in detail.  Concerns are reaction to anesthesia, continued infection, and wound healing problems but no good alternative at this point.  Reviewed Xray from last admission as well.   [name] G 09/07/2012, 9:48 PM

## 2012-09-07 NOTE — ED Provider Notes (Signed)
History     CSN: [id number]  Arrival date & time 09/07/12  1543   First MD Initiated Contact with Patient 09/07/12 1751      Chief Complaint  Patient presents with  . Elbow Pain    Left    (Consider location/radiation/quality/duration/timing/severity/associated sxs/prior treatment) HPI Comments: Patient presents to the ER for recurrent pain, swelling, redness and warmth of the left elbow region. Patient was admitted to the hospital on March 8 for septic olecranon bursitis. Patient had the bursa aspirated placed during the hospitalization and cultures grew MRSA. Patient was treated in the hospital with vancomycin and was discharged on Bactrim, which the culture showed sensitivity to.  Patient reports since discharge he has had progressive worsening of pain, swelling and redness in the area. He has not had a fever.   Past Medical History  Diagnosis Date  . Hypertension   . Duodenal ulcer hemorrhage   . Hypertrophy of prostate   . Generalized muscle weakness   . Acute respiratory failure   . Cognitive communication deficit   . MRSA (methicillin resistant Staphylococcus aureus)   . Kidney stone     Past Surgical History  Procedure Laterality Date  . Knee arthroscopy      right  . Total knee arthroplasty      right  . Wrist surgery      right  . Neck surgery    . Exploratory laparotomy  02/2011    Duodenotomy, oversew of gastric ulcer/duodenal artery  . Appendectomy    . Tibia fracture surgery      History reviewed. No pertinent family history.  History  Substance Use Topics  . Smoking status: Former Smoker -- 40 years    Quit date: 03/08/2011  . Smokeless tobacco: Never Used  . Alcohol Use: No     Comment: states he stopped 2 yrs ago      Review of Systems  Constitutional: Negative for fever.  Skin: Positive for color change and wound.  All other systems reviewed and are negative.    Allergies  Aspirin and Ibuprofen  Home Medications   Current  Outpatient Rx  Name  Route  Sig  Dispense  Refill  . acetaminophen (TYLENOL) 500 MG tablet   Oral   Take 1,000 mg by mouth at bedtime. For pain         . diclofenac sodium (VOLTAREN) 1 % GEL   Topical   Apply 2 g topically 2 (two) times daily.   1 Tube   0   . docusate sodium (COLACE) 100 MG capsule   Oral   Take 100 mg by mouth every evening.          Marland Kitchen lisinopril (PRINIVIL,ZESTRIL) 10 MG tablet   Oral   Take 10 mg by mouth daily.          . magnesium oxide (MAG-OX) 400 MG tablet   Oral   Take 400 mg by mouth at bedtime. For leg cramps         . Melatonin 3 MG CAPS   Oral   Take 1 capsule by mouth at bedtime.         . Multiple Vitamin (MULTIVITAMIN WITH MINERALS) TABS   Oral   Take 1 tablet by mouth daily.         Marland Kitchen omeprazole (PRILOSEC) 20 MG capsule   Oral   Take 20 mg by mouth daily.          Marland Kitchen rOPINIRole (REQUIP) 0.25 MG  tablet   Oral   Take 0.25 mg by mouth at bedtime.         . senna (SENOKOT) 8.6 MG TABS   Oral   Take 1 tablet by mouth at bedtime.          . sulfamethoxazole-trimethoprim (BACTRIM DS) 800-160 MG per tablet   Oral   Take 1 tablet by mouth 2 (two) times daily.   20 tablet   0   . HYDROcodone-acetaminophen (NORCO/VICODIN) 5-325 MG per tablet   Oral   Take 1-2 tablets by mouth every 4 (four) hours as needed for pain.           There were no vitals taken for this visit.  Physical Exam  Constitutional: He is oriented to person, place, and time. He appears well-developed and well-nourished. No distress.  HENT:  Head: Normocephalic and atraumatic.  Right Ear: Hearing normal.  Nose: Nose normal.  Mouth/Throat: Oropharynx is clear and moist and mucous membranes are normal.  Eyes: Conjunctivae and EOM are normal. Pupils are equal, round, and reactive to light.  Neck: Normal range of motion. Neck supple.  Cardiovascular: Normal rate, regular rhythm, S1 normal and S2 normal.  Exam reveals no gallop and no friction rub.    No murmur heard. Pulmonary/Chest: Effort normal and breath sounds normal. No respiratory distress. He exhibits no tenderness.  Abdominal: Soft. Normal appearance and bowel sounds are normal. There is no hepatosplenomegaly. There is no tenderness. There is no rebound, no guarding, no tenderness at McBurney's point and negative Murphy's sign. No hernia.  Musculoskeletal: Normal range of motion.       Left elbow: He exhibits swelling and effusion. Tenderness found.  Neurological: He is alert and oriented to person, place, and time. He has normal strength. No cranial nerve deficit or sensory deficit. Coordination normal. GCS eye subscore is 4. GCS verbal subscore is 5. GCS motor subscore is 6.  Skin: Skin is warm, dry and intact. No rash noted. No cyanosis.     Psychiatric: He has a normal mood and affect. His speech is normal and behavior is normal. Thought content normal.    ED Course  Procedures (including critical care time)  Labs Reviewed  CBC WITH DIFFERENTIAL - Abnormal; Notable for the following:    RBC 4.20 (*)    All other components within normal limits  CULTURE, BLOOD (ROUTINE X 2)  CULTURE, BLOOD (ROUTINE X 2)  BASIC METABOLIC PANEL  CBC WITH DIFFERENTIAL  CBC WITH DIFFERENTIAL   No results found.   Diagnosis: Septic olecranon bursitis    MDM  Patient returns to the ER with recurrent olecranon bursitis. Patient was treated in the hospital on March 8 to March 9 with IV antibiotic therapy. He had MRSA grow from his aspirate culture. Patient went home on appropriate antibiotic coverage. Patient has now had significant recurrence of swelling and olecranon fluid with overlying cellulitis. The extent of the cellulitis today does not appear to be as extensive as it was previously. I did discuss the case with Dr. Jerl Santos. He does confirm that the patient might need surgical intervention. Patient is to be admitted to medicine service, he will see the patient and determine  appropriate intervention if necessary. Patient restarted on vancomycin.        Gilda Crease, MD 09/07/12 [phone]

## 2012-09-07 NOTE — Progress Notes (Signed)
Patient arrived to unit in stable condition, will continue to monitor throughout shift.

## 2012-09-07 NOTE — ED Notes (Signed)
Called to give report nurse unavailable will call back.  

## 2012-09-08 ENCOUNTER — Encounter (HOSPITAL_COMMUNITY): Payer: Self-pay | Admitting: Certified Registered"

## 2012-09-08 ENCOUNTER — Encounter (HOSPITAL_COMMUNITY)
Admission: EM | Disposition: A | Discharge status: Nursing Home (Includes ICF) | Payer: Self-pay | Source: Home / Self Care | Attending: Internal Medicine

## 2012-09-08 ENCOUNTER — Inpatient Hospital Stay (HOSPITAL_COMMUNITY): Payer: Medicaid Other | Admitting: Certified Registered"

## 2012-09-08 DIAGNOSIS — A499 Bacterial infection, unspecified: Secondary | ICD-10-CM

## 2012-09-08 DIAGNOSIS — I10 Essential (primary) hypertension: Secondary | ICD-10-CM

## 2012-09-08 DIAGNOSIS — K5900 Constipation, unspecified: Secondary | ICD-10-CM

## 2012-09-08 DIAGNOSIS — G35 Multiple sclerosis: Secondary | ICD-10-CM

## 2012-09-08 DIAGNOSIS — M7020 Olecranon bursitis, unspecified elbow: Secondary | ICD-10-CM

## 2012-09-08 HISTORY — PX: OLECRANON BURSECTOMY: SHX2097

## 2012-09-08 LAB — COMPREHENSIVE METABOLIC PANEL
CO2: 27 mEq/L (ref 19–32)
Calcium: 8.5 mg/dL (ref 8.4–10.5)
Creatinine, Ser: 0.76 mg/dL (ref 0.50–1.35)
GFR calc Af Amer: >90 mL/min (ref >90)
GFR calc non Af Amer: >90 mL/min (ref >90)
Glucose, Bld: 107 mg/dL — ABNORMAL HIGH (ref 70–99)
Total Protein: 6.5 g/dL (ref 6.0–8.3)

## 2012-09-08 LAB — CBC WITH DIFFERENTIAL/PLATELET
Basophils Absolute: 0 10*3/uL (ref 0.0–0.1)
Basophils Relative: 0 % (ref 0–1)
Eosinophils Absolute: 0.1 10*3/uL (ref 0.0–0.7)
Hemoglobin: 12.5 g/dL — ABNORMAL LOW (ref 13.0–17.0)
MCH: 32.1 pg (ref 26.0–34.0)
MCHC: 34.6 g/dL (ref 30.0–36.0)
Monocytes Absolute: 0.5 10*3/uL (ref 0.1–1.0)
Monocytes Relative: 10 % (ref 3–12)
Neutrophils Relative %: 57 % (ref 43–77)
RDW: 12.4 % (ref 11.5–15.5)

## 2012-09-08 LAB — PROTIME-INR: INR: 1 (ref 0.00–1.49)

## 2012-09-08 SURGERY — BURSECTOMY, ELBOW
Anesthesia: General | Site: Elbow | Laterality: Left | Wound class: Dirty or Infected

## 2012-09-08 MED ORDER — FENTANYL CITRATE 0.05 MG/ML IJ SOLN
25.0000 ug | INTRAMUSCULAR | Status: DC | PRN
  Administered 2012-09-08 (×3): 50 ug via INTRAVENOUS

## 2012-09-08 MED ORDER — PROMETHAZINE HCL 25 MG/ML IJ SOLN: 6.2500 mg | INTRAMUSCULAR | Status: DC | PRN

## 2012-09-08 MED ORDER — SODIUM CHLORIDE 0.9 % IV SOLN
INTRAVENOUS | Status: AC
  Administered 2012-09-08: 01:00:00 via INTRAVENOUS

## 2012-09-08 MED ORDER — ONDANSETRON HCL 4 MG PO TABS: 4.0000 mg | ORAL_TABLET | Freq: Four times a day (QID) | ORAL | Status: DC | PRN

## 2012-09-08 MED ORDER — KETOROLAC TROMETHAMINE 30 MG/ML IJ SOLN
30.0000 mg | Freq: Once | INTRAMUSCULAR | Status: AC
  Administered 2012-09-08: 30 mg via INTRAVENOUS

## 2012-09-08 MED ORDER — SODIUM CHLORIDE 0.9 % IR SOLN
Status: DC | PRN
  Administered 2012-09-08: 14:00:00

## 2012-09-08 MED ORDER — ONDANSETRON HCL 4 MG/2ML IJ SOLN: 4.0000 mg | Freq: Four times a day (QID) | INTRAMUSCULAR | Status: DC | PRN

## 2012-09-08 MED ORDER — DICLOFENAC SODIUM 1 % TD GEL
2.0000 g | Freq: Two times a day (BID) | TRANSDERMAL | Status: DC
  Administered 2012-09-08 – 2012-09-11 (×6): 2 g via TOPICAL
  Filled 2012-09-08: qty 100

## 2012-09-08 MED ORDER — MELATONIN 3 MG PO CAPS: 1.0000 | ORAL_CAPSULE | Freq: Every day | ORAL | Status: DC

## 2012-09-08 MED ORDER — JUVEN PO PACK
1.0000 | PACK | Freq: Two times a day (BID) | ORAL | Status: DC
  Administered 2012-09-09 – 2012-09-11 (×5): 1 via ORAL
  Filled 2012-09-08 (×6): qty 1

## 2012-09-08 MED ORDER — ROPINIROLE HCL 0.25 MG PO TABS
0.2500 mg | ORAL_TABLET | Freq: Every day | ORAL | Status: DC
  Administered 2012-09-08 – 2012-09-10 (×3): 0.25 mg via ORAL
  Filled 2012-09-08 (×5): qty 1

## 2012-09-08 MED ORDER — DOCUSATE SODIUM 100 MG PO CAPS
100.0000 mg | ORAL_CAPSULE | Freq: Every evening | ORAL | Status: DC
  Administered 2012-09-08 – 2012-09-10 (×3): 100 mg via ORAL
  Filled 2012-09-08 (×4): qty 1

## 2012-09-08 MED ORDER — ACETAMINOPHEN 650 MG RE SUPP: 650.0000 mg | Freq: Four times a day (QID) | RECTAL | Status: DC | PRN

## 2012-09-08 MED ORDER — FENTANYL CITRATE 0.05 MG/ML IJ SOLN
INTRAMUSCULAR | Status: DC | PRN
  Administered 2012-09-08 (×2): 50 ug via INTRAVENOUS

## 2012-09-08 MED ORDER — EPHEDRINE SULFATE 50 MG/ML IJ SOLN
INTRAMUSCULAR | Status: DC | PRN
  Administered 2012-09-08 (×4): 5 mg via INTRAVENOUS

## 2012-09-08 MED ORDER — ACETAMINOPHEN 325 MG PO TABS: 650.0000 mg | ORAL_TABLET | Freq: Four times a day (QID) | ORAL | Status: DC | PRN

## 2012-09-08 MED ORDER — MAGNESIUM OXIDE 400 (241.3 MG) MG PO TABS
400.0000 mg | ORAL_TABLET | Freq: Every day | ORAL | Status: DC
  Administered 2012-09-08 – 2012-09-10 (×3): 400 mg via ORAL
  Filled 2012-09-08 (×5): qty 1

## 2012-09-08 MED ORDER — LIDOCAINE HCL (CARDIAC) 20 MG/ML IV SOLN
INTRAVENOUS | Status: DC | PRN
  Administered 2012-09-08: 20 mg via INTRAVENOUS

## 2012-09-08 MED ORDER — MORPHINE SULFATE 4 MG/ML IJ SOLN
INTRAMUSCULAR | Status: AC
  Filled 2012-09-08: qty 1

## 2012-09-08 MED ORDER — MIDAZOLAM HCL 5 MG/5ML IJ SOLN
INTRAMUSCULAR | Status: DC | PRN
  Administered 2012-09-08: 2 mg via INTRAVENOUS

## 2012-09-08 MED ORDER — MORPHINE SULFATE 2 MG/ML IJ SOLN
2.0000 mg | INTRAMUSCULAR | Status: DC | PRN
  Administered 2012-09-08 – 2012-09-11 (×20): 2 mg via INTRAVENOUS
  Filled 2012-09-08 (×20): qty 1

## 2012-09-08 MED ORDER — PANTOPRAZOLE SODIUM 40 MG PO TBEC
40.0000 mg | DELAYED_RELEASE_TABLET | Freq: Every day | ORAL | Status: DC
  Administered 2012-09-08 – 2012-09-10 (×3): 40 mg via ORAL
  Filled 2012-09-08 (×5): qty 1

## 2012-09-08 MED ORDER — PROPOFOL 10 MG/ML IV BOLUS
INTRAVENOUS | Status: DC | PRN
  Administered 2012-09-08: 150 mg via INTRAVENOUS
  Administered 2012-09-08: 50 mg via INTRAVENOUS

## 2012-09-08 MED ORDER — LISINOPRIL 10 MG PO TABS
10.0000 mg | ORAL_TABLET | Freq: Every day | ORAL | Status: DC
  Administered 2012-09-08 – 2012-09-10 (×3): 10 mg via ORAL
  Filled 2012-09-08 (×5): qty 1

## 2012-09-08 MED ORDER — BOOST PLUS PO LIQD
237.0000 mL | Freq: Three times a day (TID) | ORAL | Status: DC
  Administered 2012-09-09 – 2012-09-11 (×7): 237 mL via ORAL
  Filled 2012-09-08 (×9): qty 237

## 2012-09-08 MED ORDER — LACTATED RINGERS IV SOLN
INTRAVENOUS | Status: AC
  Administered 2012-09-08: 1000 mL via INTRAVENOUS

## 2012-09-08 MED ORDER — ONDANSETRON HCL 4 MG/2ML IJ SOLN
INTRAMUSCULAR | Status: DC | PRN
  Administered 2012-09-08: 4 mg via INTRAVENOUS

## 2012-09-08 MED ORDER — SENNA 8.6 MG PO TABS
1.0000 | ORAL_TABLET | Freq: Every day | ORAL | Status: DC
  Administered 2012-09-08 – 2012-09-10 (×3): 8.6 mg via ORAL
  Filled 2012-09-08 (×3): qty 1

## 2012-09-08 MED ORDER — MORPHINE SULFATE 4 MG/ML IJ SOLN
4.0000 mg | Freq: Once | INTRAMUSCULAR | Status: AC
  Administered 2012-09-08: 4 mg via INTRAVENOUS

## 2012-09-08 SURGICAL SUPPLY — 43 items
BAG SPEC THK2 15X12 ZIP CLS (MISCELLANEOUS)
BAG ZIPLOCK 12X15 (MISCELLANEOUS) ×1 IMPLANT
BANDAGE ELASTIC 4 VELCRO ST LF (GAUZE/BANDAGES/DRESSINGS) ×1 IMPLANT
BANDAGE GAUZE ELAST BULKY 4 IN (GAUZE/BANDAGES/DRESSINGS) ×2 IMPLANT
BLADE SURG SZ10 CARB STEEL (BLADE) ×2 IMPLANT
BNDG COHESIVE 4X5 TAN STRL (GAUZE/BANDAGES/DRESSINGS) ×1 IMPLANT
CANISTER SUCTION 2500CC (MISCELLANEOUS) ×2 IMPLANT
CLOTH BEACON ORANGE TIMEOUT ST (SAFETY) ×2 IMPLANT
CORDS BIPOLAR (ELECTRODE) ×1 IMPLANT
CUFF TOURN SGL QUICK 18 (TOURNIQUET CUFF) ×2 IMPLANT
DRAIN PENROSE 18X1/4 LTX STRL (WOUND CARE) ×2 IMPLANT
DRAPE C-ARM 42X72 X-RAY (DRAPES) IMPLANT
DRAPE SURG 17X11 SM STRL (DRAPES) ×1 IMPLANT
DRSG EMULSION OIL 3X3 NADH (GAUZE/BANDAGES/DRESSINGS) ×1 IMPLANT
DRSG PAD ABDOMINAL 8X10 ST (GAUZE/BANDAGES/DRESSINGS) ×1 IMPLANT
ELECT REM PT RETURN 9FT ADLT (ELECTROSURGICAL) ×2
ELECTRODE REM PT RTRN 9FT ADLT (ELECTROSURGICAL) ×1 IMPLANT
GAUZE XEROFORM 5X9 LF (GAUZE/BANDAGES/DRESSINGS) ×2 IMPLANT
GLOVE BIO SURGEON STRL SZ8.5 (GLOVE) ×2 IMPLANT
GLOVE BIOGEL M 8.0 STRL (GLOVE) ×2 IMPLANT
KIT BASIN OR (CUSTOM PROCEDURE TRAY) ×2 IMPLANT
LOOP VESSEL MAXI BLUE (MISCELLANEOUS) IMPLANT
NS IRRIG 1000ML POUR BTL (IV SOLUTION) ×2 IMPLANT
PACK LOWER EXTREMITY WL (CUSTOM PROCEDURE TRAY) ×1 IMPLANT
PAD ABD 7.5X8 STRL (GAUZE/BANDAGES/DRESSINGS) ×2 IMPLANT
PAD CAST 3X4 CTTN HI CHSV (CAST SUPPLIES) ×3 IMPLANT
PAD CAST 4YDX4 CTTN HI CHSV (CAST SUPPLIES) IMPLANT
PADDING CAST COTTON 3X4 STRL (CAST SUPPLIES)
PADDING CAST COTTON 4X4 STRL (CAST SUPPLIES)
POSITIONER SURGICAL ARM (MISCELLANEOUS) ×2 IMPLANT
SOL PREP POV-IOD 16OZ 10% (MISCELLANEOUS) ×2 IMPLANT
SOL PREP PROV IODINE SCRUB 4OZ (MISCELLANEOUS) IMPLANT
SPONGE GAUZE 4X4 12PLY (GAUZE/BANDAGES/DRESSINGS) ×2 IMPLANT
SPONGE LAP 4X18 X RAY DECT (DISPOSABLE) ×1 IMPLANT
SPONGE SURGIFOAM ABS GEL 100 (HEMOSTASIS) ×1 IMPLANT
SUT ETHILON 3 0 PS 1 (SUTURE) ×1 IMPLANT
SUT MERSILENE 3 0 FS 1 (SUTURE) IMPLANT
SUT PDS AB 3-0 SH 27 (SUTURE) ×1 IMPLANT
SUT PROLENE 3 0 PS 2 (SUTURE) ×2 IMPLANT
SUT VIC AB 1 CTX 36 (SUTURE)
SUT VIC AB 1 CTX36XBRD ANBCTR (SUTURE) ×1 IMPLANT
SUT VIC AB 2-0 CTX 36 (SUTURE) ×1 IMPLANT
WATER STERILE IRR 1500ML POUR (IV SOLUTION) ×1 IMPLANT

## 2012-09-08 NOTE — Progress Notes (Signed)
PHARMACIST - PHYSICIAN ORDER COMMUNICATION  CONCERNING: P&T Medication Policy on Herbal Medications  DESCRIPTION:  This patient's order for:  Melatonin  has been noted.  This product(s) is classified as an "herbal" or natural product. Due to a lack of definitive safety studies or FDA approval, nonstandard manufacturing practices, plus the potential risk of unknown drug-drug interactions while on inpatient medications, the Pharmacy and Therapeutics Committee does not permit the use of "herbal" or natural products of this type within [facility].   ACTION TAKEN: The pharmacy department is unable to verify this order at this time and your patient has been informed of this safety policy. Please reevaluate patient's clinical condition at discharge and address if the herbal or natural product(s) should be resumed at that time.  Lorenza Evangelist 09/08/2012 12:21 AM

## 2012-09-08 NOTE — Anesthesia Postprocedure Evaluation (Signed)
  Anesthesia Post-op Note  Patient: [name]  Procedure(s) Performed: Procedure(s) (LRB): OLECRANON BURSA (Left)  Patient Location: PACU  Anesthesia Type: General  Level of Consciousness: awake and alert   Airway and Oxygen Therapy: Patient Spontanous Breathing  Post-op Pain: mild  Post-op Assessment: Post-op Vital signs reviewed, Patient's Cardiovascular Status Stable, Respiratory Function Stable, Patent Airway and No signs of Nausea or vomiting  Last Vitals:  Filed Vitals:   09/08/12 1443  BP:   Pulse:   Temp: 36.6 C  Resp:     Post-op Vital Signs: stable   Complications: No apparent anesthesia complications

## 2012-09-08 NOTE — Anesthesia Preprocedure Evaluation (Addendum)
Anesthesia Evaluation  Patient identified by MRN, date of birth, ID band Patient awake    Reviewed: Allergy & Precautions, H&P , NPO status , Patient's Chart, lab work & pertinent test results  Airway Mallampati: II TM Distance: >3 FB Neck ROM: Full    Dental no notable dental hx.    Pulmonary neg pulmonary ROS,  breath sounds clear to auscultation  Pulmonary exam normal       Cardiovascular hypertension, Pt. on medications Rhythm:Regular Rate:Normal     Neuro/Psych negative neurological ROS  negative psych ROS   GI/Hepatic negative GI ROS, Neg liver ROS, PUD,   Endo/Other  negative endocrine ROS  Renal/GU negative Renal ROS  negative genitourinary   Musculoskeletal   Abdominal   Peds negative pediatric ROS (+)  Hematology negative hematology ROS (+)   Anesthesia Other Findings   Reproductive/Obstetrics negative OB ROS                           Anesthesia Physical Anesthesia Plan  ASA: III  Anesthesia Plan: General   Post-op Pain Management:    Induction: Intravenous  Airway Management Planned: LMA  Additional Equipment:   Intra-op Plan:   Post-operative Plan:   Informed Consent: I have reviewed the patients History and Physical, chart, labs and discussed the procedure including the risks, benefits and alternatives for the proposed anesthesia with the patient or authorized representative who has indicated his/her understanding and acceptance.   Dental advisory given  Plan Discussed with: CRNA and Surgeon  Anesthesia Plan Comments:        Anesthesia Quick Evaluation

## 2012-09-08 NOTE — Care Management (Signed)
Cm spoke with patient concerning discharge planning. Pt from ALF. MD conversation with CM states possible discharge with IV ABX.  Pt offered choice for [facility]. Per pt choice [facility] to provide [facility] services upon discharge. [facility] rep Baxter Hire made aware of new referral. Awaiting MD orders specifying IV ABX, dosage, duration, & frequency.    Roxy Manns Davis,RN,BSN [phone]

## 2012-09-08 NOTE — Brief Op Note (Signed)
[name] [id number] 09/08/2012   PRE-OP DIAGNOSIS: infected left olecranon bursa  POST-OP DIAGNOSIS: same  PROCEDURE: left elbow I&D and olecranon bursa excision  ANESTHESIA: general  DALLDORF,PETER G   Dictation #:  [id number]  Repeated cultures and sent to lab but not stat Would continue IV vanco for now We will pull drains which are under dressing on sunday

## 2012-09-08 NOTE — Progress Notes (Signed)
ANTIBIOTIC CONSULT NOTE - follow-up  Pharmacy Consult for Vancomycin  Indication: L elbow bursitis  Allergies  Allergen Reactions  . Aspirin Other (See Comments)    Noted on MAR. Reaction unknown  . Ibuprofen Other (See Comments)    Noted on MAR. Reaction unknown   Patient Measurements: Height: 6\' 6"  (198.1 cm) Weight: 151 lb 3.8 oz (68.6 kg) IBW/kg (Calculated) : 91.4 TBW 66.7 kg  Vital Signs: Temp: 97.4 F (36.3 C) (03/14 1545) Temp src: Oral (03/14 1130) BP: 145/91 mmHg (03/14 1545) Pulse Rate: 77 (03/14 1545) Intake/Output from previous day: 03/13 0701 - 03/14 0700 In: 199.2 [I.V.:199.2] Out: 550 [Urine:550] Intake/Output from this shift:    Labs:  Recent Labs  09/07/12 1906 09/07/12 1954 09/07/12 2000 09/08/12 0410  WBC  --  5.0 5.4 5.3  HGB  --  13.2 13.5 12.5*  PLT  --  331 322 330  CREATININE 0.73  --   --  0.76   Estimated Creatinine Clearance: 96.5 ml/min (by C-G formula based on Cr of 0.76).  Recent Labs  09/08/12 2001  VANCOTROUGH 12.2     Microbiology: Recent Results (from the past 720 hour(s))  BODY FLUID CULTURE     Status: None   Collection Time    09/02/12  9:07 PM      Result Value Range Status   Specimen Description ARM LEFT ELBOW   Final   Special Requests NONE   Final   Gram Stain     Final   Value: WBC PRESENT, PREDOMINANTLY PMN     GRAM POSITIVE COCCI IN PAIRS     IN CLUSTERS   Culture     Final   Value: MODERATE METHICILLIN RESISTANT STAPHYLOCOCCUS AUREUS     Note: RIFAMPIN AND GENTAMICIN SHOULD NOT BE USED AS SINGLE DRUGS FOR TREATMENT OF STAPH INFECTIONS. This organism is presumed to be Clindamycin resistant based on detection of inducible Clindamycin resistance.   Report Status 09/05/2012 FINAL   Final   Organism ID, Bacteria METHICILLIN RESISTANT STAPHYLOCOCCUS AUREUS   Final  BODY FLUID CULTURE     Status: None   Collection Time    09/03/12  9:42 AM      Result Value Range Status   Specimen Description OTHER ELBOW,  OLECRANON BURSITIS   Final   Special Requests Normal   Final   Gram Stain     Final   Value: FEW WBC PRESENT,BOTH PMN AND MONONUCLEAR     NO ORGANISMS SEEN   Culture     Final   Value: MODERATE METHICILLIN RESISTANT STAPHYLOCOCCUS AUREUS     Note: RIFAMPIN AND GENTAMICIN SHOULD NOT BE USED AS SINGLE DRUGS FOR TREATMENT OF STAPH INFECTIONS. This organism is presumed to be Clindamycin resistant based on detection of inducible Clindamycin resistance.   Report Status 09/05/2012 FINAL   Final   Organism ID, Bacteria METHICILLIN RESISTANT STAPHYLOCOCCUS AUREUS   Final  CULTURE, BLOOD (ROUTINE X 2)     Status: None   Collection Time    09/07/12  6:45 PM      Result Value Range Status   Specimen Description BLOOD RIGHT ARM   Final   Special Requests BOTTLES DRAWN AEROBIC ONLY 3CC   Final   Culture  Setup Time 09/07/2012 22:44   Final   Culture     Final   Value:        BLOOD CULTURE RECEIVED NO GROWTH TO DATE CULTURE WILL BE HELD FOR 5 DAYS BEFORE ISSUING A FINAL  NEGATIVE REPORT   Report Status PENDING   Incomplete  CULTURE, BLOOD (ROUTINE X 2)     Status: None   Collection Time    09/07/12  6:50 PM      Result Value Range Status   Specimen Description BLOOD LEFT ARM   Final   Special Requests BOTTLES DRAWN AEROBIC AND ANAEROBIC 5CC EACH   Final   Culture  Setup Time 09/07/2012 22:43   Final   Culture     Final   Value:        BLOOD CULTURE RECEIVED NO GROWTH TO DATE CULTURE WILL BE HELD FOR 5 DAYS BEFORE ISSUING A FINAL NEGATIVE REPORT   Report Status PENDING   Incomplete  MRSA PCR SCREENING     Status: None   Collection Time    09/08/12  5:35 AM      Result Value Range Status   MRSA by PCR NEGATIVE  NEGATIVE Final   Comment:            The GeneXpert MRSA Assay (FDA     approved for NASAL specimens     only), is one component of a     comprehensive MRSA colonization     surveillance program. It is not     intended to diagnose MRSA     infection nor to guide or     monitor  treatment for     MRSA infections.   Medical History: Past Medical History  Diagnosis Date  . Hypertension   . Duodenal ulcer hemorrhage   . Hypertrophy of prostate   . Generalized muscle weakness   . Acute respiratory failure   . Cognitive communication deficit   . MRSA (methicillin resistant Staphylococcus aureus)   . Kidney stone    Medications:  Anti-infectives   Start     Dose/Rate Route Frequency Ordered Stop   09/08/12 1403  polymyxin B 500,000 Units, bacitracin 50,000 Units in sodium chloride irrigation 0.9 % 500 mL irrigation  Status:  Discontinued       As needed 09/08/12 1403 09/08/12 1438   09/07/12 2000  vancomycin (VANCOCIN) 750 mg in sodium chloride 0.9 % 150 mL IVPB     750 mg 150 mL/hr over 60 Minutes Intravenous Every 8 hours 09/07/12 1932       [name]   Vancomycin trough 3/14 = 12.2 mcg/ml on vancomycin 750mg  IV q8h (prior to 4th dose)  Goal of Therapy:  Vancomycin trough level 10-15 mcg/ml  Plan:   Continue vancomycin 750mg  IV q8h as level is appropriate and expect level to continue to rise with continuation of therapy  Recommend recheck trough Monday to evaluate for accumulation, check sooner if change in renal function  [name], PharmD, BCPS.   Pager: [phone] 09/08/2012, 8:53 PM

## 2012-09-08 NOTE — Progress Notes (Signed)
INITIAL NUTRITION ASSESSMENT  Pt meets criteria for severe MALNUTRITION in the context of chronic illness as evidenced by <75% estimated energy intake with 14.2% weight loss in the past month in addition to pt with visible severe muscle wasting and subcutaneous fat loss in patellar, anterior thigh, and posterior calf.  DOCUMENTATION CODES Per approved criteria  -Severe malnutrition in the context of chronic illness -Underweight   INTERVENTION: - When diet advanced, recommend Boost Plus TID and Juven BID each packet mixed with 4-8oz of juice - Diet advancement per MD - Will continue to monitor   NUTRITION DIAGNOSIS: Inadequate oral intake related to inability to eat as evidenced by NPO.   Goal: Advance diet as tolerated to regular diet  Monitor:  Weights, labs, diet advancement  Reason for Assessment: Low braden  62 y.o. male  Admitting Dx: Left elbow swelling   ASSESSMENT: Pt from [facility], admitted with bursitis scheduled for I&D surgery for this later on today. Pt appears thin. Pt reports 56 pound unintended weight loss in the past 3 months. Pt reports he does not like the food where he is at. Pt reports he likes to drink Boost. Pt c/o being hungry, currently NPO. Performed nutrition focused physical exam.   Nutrition Focused Physical Exam:  Subcutaneous Fat:  Orbital Region: WNL Upper Arm Region: mild/moderate wasting Thoracic and Lumbar Region: WNL  Muscle:  Temple Region: WNL Clavicle Bone Region: mild/moderate wasting Clavicle and Acromion Bone Region: mild/moderate wasting Scapular Bone Region: WNL Dorsal Hand: mild/moderate wasting Patellar Region: severe wasting Anterior Thigh Region: severe wasting Posterior Calf Region: severe wasting  Edema: None noted   Height: Ht Readings from Last 1 Encounters:  09/07/12 6\' 6"  (1.981 m)    Weight: Wt Readings from Last 1 Encounters:  09/07/12 151 lb 3.8 oz (68.6 kg)    Ideal Body Weight: 214 lb  %  Ideal Body Weight: 70  Wt Readings from Last 10 Encounters:  09/07/12 151 lb 3.8 oz (68.6 kg)  09/07/12 151 lb 3.8 oz (68.6 kg)  09/02/12 147 lb (66.679 kg)  08/18/12 176 lb (79.833 kg)  06/26/12 176 lb (79.833 kg)  11/17/11 186 lb (84.369 kg)  04/21/11 144 lb (65.318 kg)  04/12/11 145 lb (65.772 kg)  04/07/11 145 lb (65.772 kg)    Usual Body Weight: 207 lb 3 months ago per pt  % Usual Body Weight: 73  BMI:  Body mass index is 17.48 kg/(m^2). Underweight   Estimated Nutritional Needs: Kcal: 2400-2700 Protein: 135-145g Fluid: 2.4-2.7L/day  Skin: Stage 2 pressure ulcer on sacrum   Diet Order: NPO  EDUCATION NEEDS: -Education needs addressed - discussed importance of high calorie/protein diet when diet advanced to promote wound healing and weight gain   Intake/Output Summary (Last 24 hours) at 09/08/12 1219 Last data filed at 09/08/12 0644  Gross per 24 hour  Intake 199.17 ml  Output    550 ml  Net -350.83 ml    Last BM: 3/13  Labs:   Recent Labs Lab 09/02/12 1815 09/07/12 1906 09/08/12 0410  NA 137 138 138  K 3.7 4.1 3.8  CL 101 104 106  CO2 28 27 27   BUN 10 9 8   CREATININE 0.65 0.73 0.76  CALCIUM 9.1 8.8 8.5  GLUCOSE 111* 88 107*    CBG (last 3)  No results found for this basename: GLUCAP,  in the last 72 hours  Scheduled Meds: . diclofenac sodium  2 g Topical BID  . docusate sodium  100  mg Oral QPM  . lisinopril  10 mg Oral Daily  . magnesium oxide  400 mg Oral QHS  . pantoprazole  40 mg Oral Daily  . rOPINIRole  0.25 mg Oral QHS  . senna  1 tablet Oral QHS  . vancomycin  750 mg Intravenous Q8H    Continuous Infusions: . sodium chloride 50 mL/hr at 09/08/12 0106    Past Medical History  Diagnosis Date  . Hypertension   . Duodenal ulcer hemorrhage   . Hypertrophy of prostate   . Generalized muscle weakness   . Acute respiratory failure   . Cognitive communication deficit   . MRSA (methicillin resistant Staphylococcus aureus)   .  Kidney stone     Past Surgical History  Procedure Laterality Date  . Knee arthroscopy      right  . Total knee arthroplasty      right  . Wrist surgery      right  . Neck surgery    . Exploratory laparotomy  02/2011    Duodenotomy, oversew of gastric ulcer/duodenal artery  . Appendectomy    . Tibia fracture surgery       Levon Hedger MS, RD, LDN [phone] Pager [phone] After Hours Pager

## 2012-09-08 NOTE — Progress Notes (Signed)
Pt. Bandage was soaked with bloody fluid. Per instructions bandage was reinforced, Will continue to monitor.

## 2012-09-08 NOTE — Interval H&P Note (Signed)
History and Physical Interval Note:  09/08/2012 1:30 PM  [name]  has presented today for surgery, with the diagnosis of left olecranon bursa  The various methods of treatment have been discussed with the patient and family. After consideration of risks, benefits and other options for treatment, the patient has consented to  Procedure(s) with comments: OLECRANON BURSA (Left) - LEFT  ELBOW  OLECRANON  BURSA  INCISION as a surgical intervention .  The patient's history has been reviewed, patient examined, no change in status, stable for surgery.  I have reviewed the patient's chart and labs.  Questions were answered to the patient's satisfaction.     DALLDORF,PETER G

## 2012-09-08 NOTE — Progress Notes (Addendum)
[facility]                                                                                Patient Demographics  [name], is a 62 y.o. male, DOB - 17-Aug-1950, ZOX:[id number], WJX:[id number]  Admit date - 09/07/2012  Admitting Physician Eduard Clos, MD  Outpatient Primary MD for the patient is No primary provider on file.  LOS - 1   Chief Complaint  Patient presents with  . Elbow Pain    Left        Assessment & Plan    1. Septic olecranon bursitis - patient will be kept n.p.o. past midnight in anticipation of possible surgery. Orthopedic surgeon has been consulted today for I&D later in the afternoon on 09/08/2012. Patient will be continued on IV vancomycin as patient's recent aspiration grew MRSA.   2. Hypertension - continue present medications.   3. 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 Chronic weakness and largely bedbound minimal mobility with walker lives at assisted living facility.   4. History of MS with generalized weakness and GERD. No acute issues outpatient followup.    Code Status: Full  Family Communication: Patient no family around  Disposition Plan: TBD   Procedures  I&D scheduled L Elbow on 09-08-12   Consults  Ortho   DVT Prophylaxis  SCDs  Lab Results  Component Value Date   PLT 330 09/08/2012    Medications  Scheduled Meds: . diclofenac sodium  2 g Topical BID  . docusate sodium  100 mg Oral QPM  . lisinopril  10 mg Oral Daily  . magnesium oxide  400 mg Oral QHS  . pantoprazole  40 mg Oral Daily  . rOPINIRole  0.25 mg Oral QHS  . senna  1 tablet Oral QHS  . vancomycin  750 mg Intravenous Q8H   Continuous Infusions: . sodium chloride 50 mL/hr at 09/08/12 0106   PRN Meds:.acetaminophen, acetaminophen, HYDROcodone-acetaminophen, morphine injection, ondansetron (ZOFRAN) IV, ondansetron  Antibiotics    Anti-infectives   Start     Dose/Rate Route Frequency Ordered Stop   09/07/12 2000   vancomycin (VANCOCIN) 750 mg in sodium chloride 0.9 % 150 mL IVPB     750 mg 150 mL/hr over 60 Minutes Intravenous Every 8 hours 09/07/12 1932         Time Spent in minutes  35   SINGH,PRASHANT K M.D on 09/08/2012 at 11:38 AM  Between 7am to 7pm - Pager - [phone]  After 7pm go to [email or web address] - password [email or web address]  And look for the night coverage person covering for me after hours  [facility] Office  [phone]    Subjective:   [name] today has, No headache, No chest pain, No abdominal pain - No Nausea, No new weakness tingling or numbness, No Cough - SOB. L.elbow pain.  Objective:   Filed Vitals:   09/07/12 1815 09/07/12 2153 09/07/12 2308 09/08/12 0643  BP: 128/82 141/98 133/77 126/74  Pulse: 74 70 73 68  Temp: 98.6 F (37 C)  97.8 F (36.6 C) 97.4 F (36.3 C)  TempSrc: Oral  Oral Oral  Resp: 18 20 18 18   Height:  6\' 6"  (1.981 m)   Weight:   68.6 kg (151 lb 3.8 oz)   SpO2: 100% 100% 99% 100%    Wt Readings from Last 3 Encounters:  09/07/12 68.6 kg (151 lb 3.8 oz)  09/07/12 68.6 kg (151 lb 3.8 oz)  09/02/12 66.679 kg (147 lb)     Intake/Output Summary (Last 24 hours) at 09/08/12 1138 Last data filed at 09/08/12 0644  Gross per 24 hour  Intake 199.17 ml  Output    550 ml  Net -350.83 ml    Exam Awake Alert, Oriented X 3, No new F.N deficits, Normal affect Mildred.AT,PERRAL Supple Neck,No JVD, No cervical lymphadenopathy appriciated.  Symmetrical Chest wall movement, Good air movement bilaterally, CTAB RRR,No Gallops,Rubs or new Murmurs, No Parasternal Heave +ve B.Sounds, Abd Soft, Non tender, No organomegaly appriciated, No rebound - guarding or rigidity. No Cyanosis, Clubbing or edema, No new Rash or bruise ,   L elbow has effusion and tender, limited ROM   Data Review   Micro Results Recent Results (from the past 240 hour(s))  BODY FLUID CULTURE     Status: None   Collection Time    09/02/12  9:07 PM      Result Value  Range Status   Specimen Description ARM LEFT ELBOW   Final   Special Requests NONE   Final   Gram Stain     Final   Value: WBC PRESENT, PREDOMINANTLY PMN     GRAM POSITIVE COCCI IN PAIRS     IN CLUSTERS   Culture     Final   Value: MODERATE METHICILLIN RESISTANT STAPHYLOCOCCUS AUREUS     Note: RIFAMPIN AND GENTAMICIN SHOULD NOT BE USED AS SINGLE DRUGS FOR TREATMENT OF STAPH INFECTIONS. This organism is presumed to be Clindamycin resistant based on detection of inducible Clindamycin resistance.   Report Status 09/05/2012 FINAL   Final   Organism ID, Bacteria METHICILLIN RESISTANT STAPHYLOCOCCUS AUREUS   Final  BODY FLUID CULTURE     Status: None   Collection Time    09/03/12  9:42 AM      Result Value Range Status   Specimen Description OTHER ELBOW, OLECRANON BURSITIS   Final   Special Requests Normal   Final   Gram Stain     Final   Value: FEW WBC PRESENT,BOTH PMN AND MONONUCLEAR     NO ORGANISMS SEEN   Culture     Final   Value: MODERATE METHICILLIN RESISTANT STAPHYLOCOCCUS AUREUS     Note: RIFAMPIN AND GENTAMICIN SHOULD NOT BE USED AS SINGLE DRUGS FOR TREATMENT OF STAPH INFECTIONS. This organism is presumed to be Clindamycin resistant based on detection of inducible Clindamycin resistance.   Report Status 09/05/2012 FINAL   Final   Organism ID, Bacteria METHICILLIN RESISTANT STAPHYLOCOCCUS AUREUS   Final  CULTURE, BLOOD (ROUTINE X 2)     Status: None   Collection Time    09/07/12  6:45 PM      Result Value Range Status   Specimen Description BLOOD RIGHT ARM   Final   Special Requests BOTTLES DRAWN AEROBIC ONLY 3CC   Final   Culture  Setup Time 09/07/2012 22:44   Final   Culture     Final   Value:        BLOOD CULTURE RECEIVED NO GROWTH TO DATE CULTURE WILL BE HELD FOR 5 DAYS BEFORE ISSUING A FINAL NEGATIVE REPORT   Report Status PENDING   Incomplete  CULTURE, BLOOD (ROUTINE X 2)  Status: None   Collection Time    09/07/12  6:50 PM      Result Value Range Status   Specimen  Description BLOOD LEFT ARM   Final   Special Requests BOTTLES DRAWN AEROBIC AND ANAEROBIC 5CC EACH   Final   Culture  Setup Time 09/07/2012 22:43   Final   Culture     Final   Value:        BLOOD CULTURE RECEIVED NO GROWTH TO DATE CULTURE WILL BE HELD FOR 5 DAYS BEFORE ISSUING A FINAL NEGATIVE REPORT   Report Status PENDING   Incomplete  MRSA PCR SCREENING     Status: None   Collection Time    09/08/12  5:35 AM      Result Value Range Status   MRSA by PCR NEGATIVE  NEGATIVE Final   Comment:            The GeneXpert MRSA Assay (FDA     approved for NASAL specimens     only), is one component of a     comprehensive MRSA colonization     surveillance program. It is not     intended to diagnose MRSA     infection nor to guide or     monitor treatment for     MRSA infections.    Radiology Reports Dg Sacrum/coccyx  08/18/2012  *RADIOLOGY REPORT*  Clinical Data: History of fall with pain in the region of the coccyx.  SACRUM AND COCCYX - 2+ VIEW  Comparison: No priors.  Findings: AP and lateral views of the sacrum and coccyx demonstrate no definite acute displaced fractures.  Visualized portions of the remaining pelvis also show no acute displaced fractures, but demonstrates moderate to severe bilateral hip joint osteoarthritis.  IMPRESSION: 1.  No acute radiographic abnormality of the sacrum or coccyx.   Original Report Authenticated By: Trudie Reed, M.D.    Dg Elbow Complete Left  09/02/2012  *RADIOLOGY REPORT*  Clinical Data: Insect bite, cellulitis, left posterior elbow  LEFT ELBOW - COMPLETE 3+ VIEW  Comparison: 02/24/2011  Findings: No fracture or dislocation is seen.  Moderate degenerative changes with olecranon spurring.  No displaced elbow joint fat pads to suggest an elbow joint effusion.  Severe soft tissue swelling overlying the olecranon.  No radiopaque foreign body is seen.  IMPRESSION: Severe soft tissue swelling overlying the olecranon.  No radiopaque foreign body is seen.   Moderate degenerative changes.   Original Report Authenticated By: Charline Bills, M.D.    Dg Hip Complete Left  08/18/2012  *RADIOLOGY REPORT*  Clinical Data: History of fall 2 days ago complaining of left-sided hip pain.  LEFT HIP - COMPLETE 2+ VIEW  Comparison: No priors.  Findings: AP view of the pelvis and AP and lateral views of the left hip demonstrate no acute displaced fracture, subluxation, dislocation, joint or soft tissue abnormality.  The hip joints bilaterally and there is joint space narrowing, subchondral sclerosis, subchondral cyst formation and osteophyte formation, compatible with moderate to severe osteoarthritis.  IMPRESSION: 1.  No acute radiographic abnormality of the bony pelvis or the left hip. 2.  Moderate to severe bilateral hip joint osteoarthritis.   Original Report Authenticated By: Trudie Reed, M.D.     CBC  Recent Labs Lab 09/02/12 1815 09/07/12 1954 09/07/12 2000 09/08/12 0410  WBC 8.5 5.0 5.4 5.3  HGB 13.9 13.2 13.5 12.5*  HCT 40.0 37.9* 39.0 36.1*  PLT 296 331 322 330  MCV 93.2 92.4 92.9 92.6  MCH 32.4 32.2 32.1 32.1  MCHC 34.8 34.8 34.6 34.6  RDW 12.2 12.1 12.2 12.4  LYMPHSABS 1.4 1.5 1.6 1.7  MONOABS 0.7 0.6 0.5 0.5  EOSABS 0.0 0.0 0.0 0.1  BASOSABS 0.0 0.0 0.0 0.0    Chemistries   Recent Labs Lab 09/02/12 1815 09/07/12 1906 09/08/12 0410  NA 137 138 138  K 3.7 4.1 3.8  CL 101 104 106  CO2 28 27 27   GLUCOSE 111* 88 107*  BUN 10 9 8   CREATININE 0.65 0.73 0.76  CALCIUM 9.1 8.8 8.5  AST  --   --  48*  ALT  --   --  43  ALKPHOS  --   --  157*  BILITOT  --   --  0.2*   ------------------------------------------------------------------------------------------------------------------ estimated creatinine clearance is 96.5 ml/min (by C-G formula based on Cr of 0.76). ------------------------------------------------------------------------------------------------------------------ No results found for this basename: HGBA1C,  in the  last 72 hours ------------------------------------------------------------------------------------------------------------------ No results found for this basename: CHOL, HDL, LDLCALC, TRIG, CHOLHDL, LDLDIRECT,  in the last 72 hours ------------------------------------------------------------------------------------------------------------------ No results found for this basename: TSH, T4TOTAL, FREET3, T3FREE, THYROIDAB,  in the last 72 hours ------------------------------------------------------------------------------------------------------------------ No results found for this basename: VITAMINB12, FOLATE, FERRITIN, TIBC, IRON, RETICCTPCT,  in the last 72 hours  Coagulation profile  Recent Labs Lab 09/08/12 0410  INR 1.00    No results found for this basename: DDIMER,  in the last 72 hours  Cardiac Enzymes No results found for this basename: CK, CKMB, TROPONINI, MYOGLOBIN,  in the last 168 hours ------------------------------------------------------------------------------------------------------------------ No components found with this basename: POCBNP,

## 2012-09-08 NOTE — Transfer of Care (Signed)
Immediate Anesthesia Transfer of Care Note  Patient: [name]  Procedure(s) Performed: Procedure(s) with comments: OLECRANON BURSA (Left) - LEFT  ELBOW  OLECRANON  BURSA  INCISION  Patient Location: PACU  Anesthesia Type:General  Level of Consciousness: awake, oriented, sedated and patient cooperative  Airway & Oxygen Therapy: Patient Spontanous Breathing and Patient connected to face mask oxygen  Post-op Assessment: Report given to PACU RN and Post -op Vital signs reviewed and stable  Post vital signs: Reviewed and stable  Complications: No apparent anesthesia complications

## 2012-09-08 NOTE — Progress Notes (Addendum)
CSW received referral that pt admitted from [facility]. CSW attempted to complete psychosocial assessment, however pt currently down for a procedure. CSW completed FL2 and placed in shadow chart. CSW will ask weekend CSW to follow up if time permits to complete full psychosocial assessment.  Jacklynn Lewis, MSW, LCSWA  Clinical Social Work [phone]

## 2012-09-09 DIAGNOSIS — Z8719 Personal history of other diseases of the digestive system: Secondary | ICD-10-CM

## 2012-09-09 NOTE — Progress Notes (Signed)
Clinical Social Work Department BRIEF PSYCHOSOCIAL ASSESSMENT 09/09/2012  Patient:  PRADEEP, BEAUBRUN     Account Number:  [id number]     Admit date:  09/07/2012  Clinical Social Worker:  Doroteo Glassman  Date/Time:  09/09/2012 03:16 PM  Referred by:  Physician  Date Referred:  09/09/2012 Referred for  ALF Placement   Other Referral:   Interview type:  Patient Other interview type:    PSYCHOSOCIAL DATA Living Status:  FACILITY Admitted from facility:  [name] Level of care:  Assisted Living Primary support name:  Irwin Brakeman Primary support relationship to patient:  SIBLING Degree of support available:   unknown    CURRENT CONCERNS Current Concerns  Post-Acute Placement   Other Concerns:    SOCIAL WORK ASSESSMENT / PLAN Met with Pt to discuss d/c plans.    Pt reported that he is from [facility] and that he'd like to return there upon d/c.  Pt was in pain and asked CSW to get his RN for his pain meds.    CSW notified RN.    Contacted [name].  Facility able to accept Pt back upon d/c.    CSW thanked Pt for his time.   Assessment/plan status:  Psychosocial Support/Ongoing Assessment of Needs Other assessment/ plan:   Information/referral to community resources:   n/a    PATIENT'S/FAMILY'S RESPONSE TO PLAN OF CARE: Pt thanked CSW for time and assistance.   CSW to continue to follow.  Providence Crosby, LCSWA Clinical Social Work [phone]

## 2012-09-09 NOTE — Op Note (Signed)
[name], [name]               ACCOUNT NO.:  [id number]  MEDICAL RECORD NO.:  [id number]  LOCATION:  1309                         FACILITY:  [facility]  PHYSICIAN:  Lubertha Basque. Eily Louvier, M.D.DATE OF BIRTH:  1951/03/05  DATE OF PROCEDURE:  09/08/2012 DATE OF DISCHARGE:                              OPERATIVE REPORT   PREOPERATIVE DIAGNOSIS:  Infected left olecranon bursa.  POSTOPERATIVE DIAGNOSIS:  Infected left olecranon bursa.  PROCEDURE:  Excision of left elbow olecranon bursa.  ANESTHESIA:  General.  ATTENDING SURGEON:  Lubertha Basque. Jerl Santos, M.D.  ASSISTANT:  Lindwood Qua, P.A.  INDICATION FOR PROCEDURE:  The patient is a 62 year old man with a number of medical issues.  He was admitted a couple of weeks ago with an infected left olecranon bursa.  He underwent aspiration serially and then was managed with IV vancomycin followed by oral Bactrim. Unfortunately, his problem continued and he presented yesterday again with a draining olecranon bursa, which was very swollen and red.  He is offered more aggressive irrigation and debridement with olecranon bursa excision.  Informed operative consent was obtained after discussion of possible complications including reaction to anesthesia and continued infection as well as wound healing problems.  SUMMARY OF FINDINGS AND PROCEDURE:  Under general anesthesia, a thorough irrigation and debridement was performed of the olecranon bursa region and the bursa itself was excised.  He was closed over 2 Penrose drains.  DESCRIPTION OF PROCEDURE:  The patient was taken to the operating suite where general anesthetic was applied without difficulty.  He was positioned supine and prepped and draped in normal sterile fashion. After the administration of his baseline IV vancomycin and an appropriate time out, the left arm was elevated, exsanguinated, and tourniquet was inflated about the upper arm.  A longitudinal incision was made across the elbow.   Dissection was carried down to a pus-filled olecranon bursa.  This was cultured aerobic and anaerobic.  I then performed a thorough irrigation and debridement with olecranon bursa excision.  The wound was then irrigated with triple antibiotic solution. We then reapproximated the skin and underlying soft tissues in the several areas to tack down and remove the dead space.  This was done with Prolene suture.  I then placed 2 Penrose drains.  I closed in between these drains with nylon suture in vertical mattress fashion reapproximating the wound loosely.  The tourniquet was deflated and bleeding was easily controlled with some pressure.  We then placed Xeroform gauze, dry gauze, and a loose Ace wrap.  Estimated blood loss, intraoperative fluids as well as accurate tourniquet time could be obtained from anesthesia records.  DISPOSITION:  The patient was extubated in the operating room and taken to recovery room in stable condition.  Plans were for him to be admitted back to the Medicine Service for appropriate postop care to include continued IV vancomycin.     Lubertha Basque Jerl Santos, M.D.     PGD/MEDQ  D:  09/08/2012  T:  09/09/2012  Job:  [id number]

## 2012-09-09 NOTE — Progress Notes (Signed)
[facility]                                                                                Patient Demographics  [name], is a 62 y.o. male, DOB - [birth year], HYQ:[id number], [id number]:[id number]  Admit date - 09/07/2012  Admitting Physician Eduard Clos, MD  Outpatient Primary MD for the patient is No primary provider on file.  LOS - 2   Chief Complaint  Patient presents with  . Elbow Pain    Left        Assessment & Plan    1. Septic olecranon bursitis - patient will be kept n.p.o. past midnight in anticipation of possible surgery. Orthopedic surgeon has been consulted and I&D done on 09/08/2012. Patient will be continued on IV vancomycin as patient's recent aspiration grew MRSA.   2. Hypertension - continue present medications.   3. 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 Chronic weakness and largely bedbound minimal mobility with walker lives at assisted living facility.   4. History of generalized weakness and GERD. No acute issues outpatient followup.    Code Status: Full  Family Communication: Patient no family around  Disposition Plan: TBD   Procedures  I&D L Elbow on 09-08-12   Consults  Ortho   DVT Prophylaxis  SCDs  Lab Results  Component Value Date   PLT 330 09/08/2012    Medications  Scheduled Meds: . diclofenac sodium  2 g Topical BID  . docusate sodium  100 mg Oral QPM  . lactose free nutrition  237 mL Oral TID WC  . lisinopril  10 mg Oral Daily  . magnesium oxide  400 mg Oral QHS  . nutrition supplement  1 packet Oral BID BM  . pantoprazole  40 mg Oral Daily  . rOPINIRole  0.25 mg Oral QHS  . senna  1 tablet Oral QHS  . vancomycin  750 mg Intravenous Q8H   Continuous Infusions: . lactated ringers 1,000 mL (09/08/12 1308)   PRN Meds:.acetaminophen, acetaminophen, HYDROcodone-acetaminophen, morphine injection, ondansetron (ZOFRAN) IV, ondansetron  Antibiotics    Anti-infectives   Start     Dose/Rate  Route Frequency Ordered Stop   09/08/12 1403  polymyxin B 500,000 Units, bacitracin 50,000 Units in sodium chloride irrigation 0.9 % 500 mL irrigation  Status:  Discontinued       As needed 09/08/12 1403 09/08/12 1438   09/07/12 2000  vancomycin (VANCOCIN) 750 mg in sodium chloride 0.9 % 150 mL IVPB     750 mg 150 mL/hr over 60 Minutes Intravenous Every 8 hours 09/07/12 1932         Time Spent in minutes  35   Meleny Tregoning K M.D on 09/09/2012 at 10:37 AM  Between 7am to 7pm - Pager - [phone]  After 7pm go to [email or web address] - password [email or web address]  And look for the night coverage person covering for me after hours  [facility] Office  [phone]    Subjective:   [name] today has, No headache, No chest pain, No abdominal pain - No Nausea, No new weakness tingling or numbness, No Cough - SOB. L.elbow pain.  Objective:   Filed Vitals:  09/08/12 1530 09/08/12 1545 09/08/12 2239 09/09/12 0624  BP: 148/105 145/91 107/56 114/62  Pulse: 80 77 78 71  Temp: 97.8 F (36.6 C) 97.4 F (36.3 C) 97.7 F (36.5 C) 98.3 F (36.8 C)  TempSrc:   Oral Oral  Resp: 15 16 18 18   Height:      Weight:      SpO2: 100% 100% 100% 100%    Wt Readings from Last 3 Encounters:  09/07/12 68.6 kg (151 lb 3.8 oz)  09/07/12 68.6 kg (151 lb 3.8 oz)  09/02/12 66.679 kg (147 lb)     Intake/Output Summary (Last 24 hours) at 09/09/12 1037 Last data filed at 09/09/12 0800  Gross per 24 hour  Intake   1828 ml  Output   1525 ml  Net    303 ml    Exam Awake Alert, Oriented X 3, No new F.N deficits, Normal affect [place].AT,PERRAL Supple Neck,No JVD, No cervical lymphadenopathy appriciated.  Symmetrical Chest wall movement, Good air movement bilaterally, CTAB RRR,No Gallops,Rubs or new Murmurs, No Parasternal Heave +ve B.Sounds, Abd Soft, Non tender, No organomegaly appriciated, No rebound - guarding or rigidity. No Cyanosis, Clubbing or edema, No new Rash or bruise ,   L elbow  under bandage   Data Review   Micro Results Recent Results (from the past 240 hour(s))  BODY FLUID CULTURE     Status: None   Collection Time    09/02/12  9:07 PM      Result Value Range Status   Specimen Description ARM LEFT ELBOW   Final   Special Requests NONE   Final   Gram Stain     Final   Value: WBC PRESENT, PREDOMINANTLY PMN     GRAM POSITIVE COCCI IN PAIRS     IN CLUSTERS   Culture     Final   Value: MODERATE METHICILLIN RESISTANT STAPHYLOCOCCUS AUREUS     Note: RIFAMPIN AND GENTAMICIN SHOULD NOT BE USED AS SINGLE DRUGS FOR TREATMENT OF STAPH INFECTIONS. This organism is presumed to be Clindamycin resistant based on detection of inducible Clindamycin resistance.   Report Status 09/05/2012 FINAL   Final   Organism ID, Bacteria METHICILLIN RESISTANT STAPHYLOCOCCUS AUREUS   Final  BODY FLUID CULTURE     Status: None   Collection Time    09/03/12  9:42 AM      Result Value Range Status   Specimen Description OTHER ELBOW, OLECRANON BURSITIS   Final   Special Requests Normal   Final   Gram Stain     Final   Value: FEW WBC PRESENT,BOTH PMN AND MONONUCLEAR     NO ORGANISMS SEEN   Culture     Final   Value: MODERATE METHICILLIN RESISTANT STAPHYLOCOCCUS AUREUS     Note: RIFAMPIN AND GENTAMICIN SHOULD NOT BE USED AS SINGLE DRUGS FOR TREATMENT OF STAPH INFECTIONS. This organism is presumed to be Clindamycin resistant based on detection of inducible Clindamycin resistance.   Report Status 09/05/2012 FINAL   Final   Organism ID, Bacteria METHICILLIN RESISTANT STAPHYLOCOCCUS AUREUS   Final  CULTURE, BLOOD (ROUTINE X 2)     Status: None   Collection Time    09/07/12  6:45 PM      Result Value Range Status   Specimen Description BLOOD RIGHT ARM   Final   Special Requests BOTTLES DRAWN AEROBIC ONLY 3CC   Final   Culture  Setup Time 09/07/2012 22:44   Final   Culture     Final  Value:        BLOOD CULTURE RECEIVED NO GROWTH TO DATE CULTURE WILL BE HELD FOR 5 DAYS BEFORE ISSUING A  FINAL NEGATIVE REPORT   Report Status PENDING   Incomplete  CULTURE, BLOOD (ROUTINE X 2)     Status: None   Collection Time    09/07/12  6:50 PM      Result Value Range Status   Specimen Description BLOOD LEFT ARM   Final   Special Requests BOTTLES DRAWN AEROBIC AND ANAEROBIC 5CC EACH   Final   Culture  Setup Time 09/07/2012 22:43   Final   Culture     Final   Value:        BLOOD CULTURE RECEIVED NO GROWTH TO DATE CULTURE WILL BE HELD FOR 5 DAYS BEFORE ISSUING A FINAL NEGATIVE REPORT   Report Status PENDING   Incomplete  MRSA PCR SCREENING     Status: None   Collection Time    09/08/12  5:35 AM      Result Value Range Status   MRSA by PCR NEGATIVE  NEGATIVE Final   Comment:            The GeneXpert MRSA Assay (FDA     approved for NASAL specimens     only), is one component of a     comprehensive MRSA colonization     surveillance program. It is not     intended to diagnose MRSA     infection nor to guide or     monitor treatment for     MRSA infections.  CULTURE, ROUTINE-ABSCESS     Status: None   Collection Time    09/08/12  2:00 PM      Result Value Range Status   Specimen Description ARM LEFT ELBOW   Final   Special Requests NONE   Final   Gram Stain     Final   Value: RARE WBC PRESENT,BOTH PMN AND MONONUCLEAR     FEW SQUAMOUS EPITHELIAL CELLS PRESENT     RARE GRAM POSITIVE COCCI IN PAIRS     IN CLUSTERS   Culture NO GROWTH   Final   Report Status PENDING   Incomplete  ANAEROBIC CULTURE     Status: None   Collection Time    09/08/12  2:00 PM      Result Value Range Status   Specimen Description ARM LEFT ELBOW   Final   Special Requests NONE   Final   Gram Stain     Final   Value: RARE WBC PRESENT,BOTH PMN AND MONONUCLEAR     FEW SQUAMOUS EPITHELIAL CELLS PRESENT     RARE GRAM POSITIVE COCCI IN PAIRS     IN CLUSTERS   Culture PENDING   Incomplete   Report Status PENDING   Incomplete    Radiology Reports Dg Sacrum/coccyx  08/18/2012  *RADIOLOGY REPORT*   Clinical Data: History of fall with pain in the region of the coccyx.  SACRUM AND COCCYX - 2+ VIEW  Comparison: No priors.  Findings: AP and lateral views of the sacrum and coccyx demonstrate no definite acute displaced fractures.  Visualized portions of the remaining pelvis also show no acute displaced fractures, but demonstrates moderate to severe bilateral hip joint osteoarthritis.  IMPRESSION: 1.  No acute radiographic abnormality of the sacrum or coccyx.   Original Report Authenticated By: Trudie Reed, M.D.    Dg Elbow Complete Left  09/02/2012  *RADIOLOGY REPORT*  Clinical Data: Insect bite, cellulitis,  left posterior elbow  LEFT ELBOW - COMPLETE 3+ VIEW  Comparison: 02/24/2011  Findings: No fracture or dislocation is seen.  Moderate degenerative changes with olecranon spurring.  No displaced elbow joint fat pads to suggest an elbow joint effusion.  Severe soft tissue swelling overlying the olecranon.  No radiopaque foreign body is seen.  IMPRESSION: Severe soft tissue swelling overlying the olecranon.  No radiopaque foreign body is seen.  Moderate degenerative changes.   Original Report Authenticated By: Charline Bills, M.D.    Dg Hip Complete Left  08/18/2012  *RADIOLOGY REPORT*  Clinical Data: History of fall 2 days ago complaining of left-sided hip pain.  LEFT HIP - COMPLETE 2+ VIEW  Comparison: No priors.  Findings: AP view of the pelvis and AP and lateral views of the left hip demonstrate no acute displaced fracture, subluxation, dislocation, joint or soft tissue abnormality.  The hip joints bilaterally and there is joint space narrowing, subchondral sclerosis, subchondral cyst formation and osteophyte formation, compatible with moderate to severe osteoarthritis.  IMPRESSION: 1.  No acute radiographic abnormality of the bony pelvis or the left hip. 2.  Moderate to severe bilateral hip joint osteoarthritis.   Original Report Authenticated By: Trudie Reed, M.D.     CBC  Recent Labs Lab  09/02/12 1815 09/07/12 1954 09/07/12 2000 09/08/12 0410  WBC 8.5 5.0 5.4 5.3  HGB 13.9 13.2 13.5 12.5*  HCT 40.0 37.9* 39.0 36.1*  PLT 296 331 322 330  MCV 93.2 92.4 92.9 92.6  MCH 32.4 32.2 32.1 32.1  MCHC 34.8 34.8 34.6 34.6  RDW 12.2 12.1 12.2 12.4  LYMPHSABS 1.4 1.5 1.6 1.7  MONOABS 0.7 0.6 0.5 0.5  EOSABS 0.0 0.0 0.0 0.1  BASOSABS 0.0 0.0 0.0 0.0    Chemistries   Recent Labs Lab 09/02/12 1815 09/07/12 1906 09/08/12 0410  NA 137 138 138  K 3.7 4.1 3.8  CL 101 104 106  CO2 28 27 27   GLUCOSE 111* 88 107*  BUN 10 9 8   CREATININE 0.65 0.73 0.76  CALCIUM 9.1 8.8 8.5  AST  --   --  48*  ALT  --   --  43  ALKPHOS  --   --  157*  BILITOT  --   --  0.2*   ------------------------------------------------------------------------------------------------------------------ estimated creatinine clearance is 96.5 ml/min (by C-G formula based on Cr of 0.76). ------------------------------------------------------------------------------------------------------------------ No results found for this basename: HGBA1C,  in the last 72 hours ------------------------------------------------------------------------------------------------------------------ No results found for this basename: CHOL, HDL, LDLCALC, TRIG, CHOLHDL, LDLDIRECT,  in the last 72 hours ------------------------------------------------------------------------------------------------------------------ No results found for this basename: TSH, T4TOTAL, FREET3, T3FREE, THYROIDAB,  in the last 72 hours ------------------------------------------------------------------------------------------------------------------ No results found for this basename: VITAMINB12, FOLATE, FERRITIN, TIBC, IRON, RETICCTPCT,  in the last 72 hours  Coagulation profile  Recent Labs Lab 09/08/12 0410  INR 1.00    No results found for this basename: DDIMER,  in the last 72 hours  Cardiac Enzymes No results found for this basename: CK,  CKMB, TROPONINI, MYOGLOBIN,  in the last 168 hours ------------------------------------------------------------------------------------------------------------------ No components found with this basename: POCBNP,

## 2012-09-09 NOTE — Progress Notes (Signed)
Pt 1 day PO Excision of L elbow olecranon bursa, 2nd to infection. He is monitored by medicine service and undergoing IV Vanc, he is tolerating this well. He complains of px at L elbow and some numbness in L hand and fingers. He does not feel as though his px is very well controlled, states he is only receiving IV px meds.  BP 114/62  Pulse 71  Temp(Src) 98.3 F (36.8 C) (Oral)  Resp 18  Ht 6\' 6"  (1.981 m)  Wt 68.6 kg (151 lb 3.8 oz)  BMI 17.48 kg/m2  SpO2 100%  CBC    Component Value Date/Time   WBC 5.3 09/08/2012 0410   RBC 3.90* 09/08/2012 0410   HGB 12.5* 09/08/2012 0410   HCT 36.1* 09/08/2012 0410   PLT 330 09/08/2012 0410   MCV 92.6 09/08/2012 0410   MCH 32.1 09/08/2012 0410   MCHC 34.6 09/08/2012 0410   RDW 12.4 09/08/2012 0410   LYMPHSABS 1.7 09/08/2012 0410   MONOABS 0.5 09/08/2012 0410   EOSABS 0.1 09/08/2012 0410   BASOSABS 0.0 09/08/2012 0410   Pt laying comfortably in hospital bed, bandage maintained on L elbow, drain in place with minimal drainage, able to move all fingers freely of L hand, grip strength on L diminished at 4+/5 compared to 5/5 on R, 2+ distal radial pulse and cap refill <2sec, temp equal BIL of UE, neurovasculary intact  Pt 1 Day PO L Olecranon Bursectomy for infection  -Cont IV Vanc/post op care per Med Service   -Bandage to be changed tomorrow and drain will be pulled tomorrow  -Discussed addition of PO px meds (Norco) and wean of IV Morphine  -Avoid placing ice pack over medial elbow for >15-24min, place posterior/laterally

## 2012-09-10 LAB — CBC
MCH: 31.8 pg (ref 26.0–34.0)
MCV: 93.4 fL (ref 78.0–100.0)
Platelets: 311 10*3/uL (ref 150–400)
RDW: 12.7 % (ref 11.5–15.5)
WBC: 5.9 10*3/uL (ref 4.0–10.5)

## 2012-09-10 LAB — BASIC METABOLIC PANEL
CO2: 29 mEq/L (ref 19–32)
Calcium: 8.5 mg/dL (ref 8.4–10.5)
Creatinine, Ser: 0.75 mg/dL (ref 0.50–1.35)

## 2012-09-10 MED ORDER — DOXYCYCLINE HYCLATE 50 MG PO CAPS: 100.0000 mg | ORAL_CAPSULE | Freq: Two times a day (BID) | ORAL | Status: DC

## 2012-09-10 MED ORDER — BOOST PLUS PO LIQD: 237.0000 mL | Freq: Three times a day (TID) | ORAL | Status: DC

## 2012-09-10 MED ORDER — HYDROCODONE-ACETAMINOPHEN 5-325 MG PO TABS: 2.0000 | ORAL_TABLET | Freq: Three times a day (TID) | ORAL | Status: DC | PRN

## 2012-09-10 NOTE — Progress Notes (Addendum)
   CARE MANAGEMENT NOTE 09/10/2012  Patient:  SHELBY, ANDERLE   Account Number:  [id number]  Date Initiated:  09/09/2012  Documentation initiated by:  DAVIS,TYMEEKA  Subjective/Objective Assessment:   62 yo male admitted s/p LEFT  ELBOW  OLECRANON  BURSA INCISION. PTA pt form ALF.     Action/Plan:   ALf when stable with HH services.   Anticipated DC Date:  09/11/2012   Anticipated DC Plan:  ASSISTED LIVING / REST HOME  In-house referral  Clinical Social Worker      DC Associate Professor  CM consult      [facility] Choice  HOME HEALTH   Choice offered to / List presented to:  C-1 Patient        HH arranged  HH-1 RN  HH-2 PT  HH-3 OT      [facility] agency  [facility]   Status of service:  Completed, signed off Medicare Important Message given?   (If response is "NO", the following Medicare IM given date fields will be blank) Date Medicare IM given:   Date Additional Medicare IM given:    Discharge Disposition:  HOME W HOME HEALTH SERVICES  Per UR Regulation:    If discussed at Long Length of Stay Meetings, dates discussed:    Comments:  09/10/2012 11:09 AM NCM spoke to pt and states he had [facility] in the past. Requesting services from [facility]. Faxed referral to [facility] for [facility] RN, PT and OT. Pt is not dc home on IV abx. Contacted [facility] to make aware.  Isidoro Donning RN CCM Case Mgmt phone [phone]   09/09/12 1249 Leonie Green [phone] Cm spoke with patient concerning discharge planning. Pt from ALF. MD conversation with CM states possible discharge with IV ABX.  Pt offered choice for [facility]. Per pt choice [facility] to provide [facility] services upon discharge. [facility] rep Baxter Hire made aware of new referral. Awaiting MD orders specifying IV ABX, dosage, duration, & frequency.

## 2012-09-10 NOTE — Discharge Summary (Addendum)
[facility]                                                                                   [name], is a 62 y.o. male  DOB 1950/11/04  MRN [id number].  Admission date:  09/07/2012  Discharge Date:  09/11/2012  Primary MD  No primary provider on file.  Admitting Physician  Eduard Clos, MD  Admission Diagnosis  Hypertension [401.9] Sacral decubitus ulcer, stage II [707.03, 707.22] Septic olecranon bursitis, left [726.33, 041.9]  Discharge Diagnosis     Active Problems:   Septic olecranon bursitis   Sacral decubitus ulcer   Hypertension    Past Medical History  Diagnosis Date  . Hypertension   . Duodenal ulcer hemorrhage   . Hypertrophy of prostate   . Generalized muscle weakness   . Acute respiratory failure   . Cognitive communication deficit   . MRSA (methicillin resistant Staphylococcus aureus)   . Kidney stone     Past Surgical History  Procedure Laterality Date  . Knee arthroscopy      right  . Total knee arthroplasty      right  . Wrist surgery      right  . Neck surgery    . Exploratory laparotomy  02/2011    Duodenotomy, oversew of gastric ulcer/duodenal artery  . Appendectomy    . Tibia fracture surgery       Recommendations for primary care physician for things to follow:       Discharge Diagnoses:   Active Problems:   Septic olecranon bursitis   Sacral decubitus ulcer   Hypertension    Discharge Condition: Stable   Diet recommendation: See Discharge Instructions below   ConsultsOrtho    History of present illness and  Hospital Course:     Kindly see H&P for history of present illness and admission details, please review complete Labs, Consult reports and Test reports for all details in brief [name], is a 62 y.o. 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 Priscille Loveless and underwent repeat I&D, clinically now much improved, discussed the case with orthopedics physician on call today who saw the patient Dr. Bunnie Domino ski, patient okay for discharge with every 72 hour dressing changes and oral doxycycline for 10 more days per him. He will closely follow with orthopedics as outpatient. Home health nursing and PT has been ordered.    For his hypertension, chronic stage II sacral decubitus ulcer, bilateral lower extremity weakness due to bilateral knee injury in the past - continue home medications, outpatient PT and followup with PCP.    Today   Subjective:   [name] today has no headache,no chest abdominal pain,no new weakness tingling or numbness, feels much better wants to go home today.   Objective:   Blood pressure 117/55, pulse 71, temperature 97.9 F (36.6 C), temperature source Oral, resp. rate 18, height 6\' 6"  (1.981  m), weight 68.6 kg (151 lb 3.8 oz), SpO2 100.00%.   Intake/Output Summary (Last 24 hours) at 09/11/12 1258 Last data filed at 09/11/12 4098  Gross per 24 hour  Intake   1140 ml  Output   1925 ml  Net   -785 ml    Exam Awake Alert, Oriented *3, No new F.N deficits, Normal affect [place].AT,PERRAL Supple Neck,No JVD, No cervical lymphadenopathy appriciated.  Symmetrical Chest wall movement, Good air movement bilaterally, CTAB RRR,No Gallops,Rubs or new Murmurs, No Parasternal Heave +ve B.Sounds, Abd Soft, Non tender, No organomegaly appriciated, No rebound -guarding or rigidity. No Cyanosis, Clubbing or edema, No new Rash or bruise Left elbow under bandage, no surrounding satellite is a warmth.   Data Review   Major procedures and Radiology Reports - PLEASE review detailed and final reports for all details in brief -       Dg Sacrum/coccyx  08/18/2012  *RADIOLOGY REPORT*  Clinical  Data: History of fall with pain in the region of the coccyx.  SACRUM AND COCCYX - 2+ VIEW  Comparison: No priors.  Findings: AP and lateral views of the sacrum and coccyx demonstrate no definite acute displaced fractures.  Visualized portions of the remaining pelvis also show no acute displaced fractures, but demonstrates moderate to severe bilateral hip joint osteoarthritis.  IMPRESSION: 1.  No acute radiographic abnormality of the sacrum or coccyx.   Original Report Authenticated By: Trudie Reed, M.D.    Dg Elbow Complete Left  09/02/2012  *RADIOLOGY REPORT*  Clinical Data: Insect bite, cellulitis, left posterior elbow  LEFT ELBOW - COMPLETE 3+ VIEW  Comparison: 02/24/2011  Findings: No fracture or dislocation is seen.  Moderate degenerative changes with olecranon spurring.  No displaced elbow joint fat pads to suggest an elbow joint effusion.  Severe soft tissue swelling overlying the olecranon.  No radiopaque foreign body is seen.  IMPRESSION: Severe soft tissue swelling overlying the olecranon.  No radiopaque foreign body is seen.  Moderate degenerative changes.   Original Report Authenticated By: Charline Bills, M.D.    Dg Hip Complete Left  08/18/2012  *RADIOLOGY REPORT*  Clinical Data: History of fall 2 days ago complaining of left-sided hip pain.  LEFT HIP - COMPLETE 2+ VIEW  Comparison: No priors.  Findings: AP view of the pelvis and AP and lateral views of the left hip demonstrate no acute displaced fracture, subluxation, dislocation, joint or soft tissue abnormality.  The hip joints bilaterally and there is joint space narrowing, subchondral sclerosis, subchondral cyst formation and osteophyte formation, compatible with moderate to severe osteoarthritis.  IMPRESSION: 1.  No acute radiographic abnormality of the bony pelvis or the left hip. 2.  Moderate to severe bilateral hip joint osteoarthritis.   Original Report Authenticated By: Trudie Reed, M.D.     Micro Results      Recent  Results (from the past 240 hour(s))  BODY FLUID CULTURE     Status: None   Collection Time    09/02/12  9:07 PM      Result Value Range Status   Specimen Description ARM LEFT ELBOW   Final   Special Requests NONE   Final   Gram Stain     Final   Value: WBC PRESENT, PREDOMINANTLY PMN     GRAM POSITIVE COCCI IN PAIRS     IN CLUSTERS   Culture     Final   Value: MODERATE METHICILLIN RESISTANT STAPHYLOCOCCUS AUREUS     Note: RIFAMPIN AND GENTAMICIN SHOULD NOT BE USED  AS SINGLE DRUGS FOR TREATMENT OF STAPH INFECTIONS. This organism is presumed to be Clindamycin resistant based on detection of inducible Clindamycin resistance.   Report Status 09/05/2012 FINAL   Final   Organism ID, Bacteria METHICILLIN RESISTANT STAPHYLOCOCCUS AUREUS   Final  BODY FLUID CULTURE     Status: None   Collection Time    09/03/12  9:42 AM      Result Value Range Status   Specimen Description OTHER ELBOW, OLECRANON BURSITIS   Final   Special Requests Normal   Final   Gram Stain     Final   Value: FEW WBC PRESENT,BOTH PMN AND MONONUCLEAR     NO ORGANISMS SEEN   Culture     Final   Value: MODERATE METHICILLIN RESISTANT STAPHYLOCOCCUS AUREUS     Note: RIFAMPIN AND GENTAMICIN SHOULD NOT BE USED AS SINGLE DRUGS FOR TREATMENT OF STAPH INFECTIONS. This organism is presumed to be Clindamycin resistant based on detection of inducible Clindamycin resistance.   Report Status 09/05/2012 FINAL   Final   Organism ID, Bacteria METHICILLIN RESISTANT STAPHYLOCOCCUS AUREUS   Final  CULTURE, BLOOD (ROUTINE X 2)     Status: None   Collection Time    09/07/12  6:45 PM      Result Value Range Status   Specimen Description BLOOD RIGHT ARM   Final   Special Requests BOTTLES DRAWN AEROBIC ONLY 3CC   Final   Culture  Setup Time 09/07/2012 22:44   Final   Culture     Final   Value:        BLOOD CULTURE RECEIVED NO GROWTH TO DATE CULTURE WILL BE HELD FOR 5 DAYS BEFORE ISSUING A FINAL NEGATIVE REPORT   Report Status PENDING    Incomplete  CULTURE, BLOOD (ROUTINE X 2)     Status: None   Collection Time    09/07/12  6:50 PM      Result Value Range Status   Specimen Description BLOOD LEFT ARM   Final   Special Requests BOTTLES DRAWN AEROBIC AND ANAEROBIC 5CC EACH   Final   Culture  Setup Time 09/07/2012 22:43   Final   Culture     Final   Value:        BLOOD CULTURE RECEIVED NO GROWTH TO DATE CULTURE WILL BE HELD FOR 5 DAYS BEFORE ISSUING A FINAL NEGATIVE REPORT   Report Status PENDING   Incomplete  MRSA PCR SCREENING     Status: None   Collection Time    09/08/12  5:35 AM      Result Value Range Status   MRSA by PCR NEGATIVE  NEGATIVE Final   Comment:            The GeneXpert MRSA Assay (FDA     approved for NASAL specimens     only), is one component of a     comprehensive MRSA colonization     surveillance program. It is not     intended to diagnose MRSA     infection nor to guide or     monitor treatment for     MRSA infections.  CULTURE, ROUTINE-ABSCESS     Status: None   Collection Time    09/08/12  2:00 PM      Result Value Range Status   Specimen Description ARM LEFT ELBOW   Final   Special Requests NONE   Final   Gram Stain     Final   Value: RARE WBC PRESENT,BOTH PMN  AND MONONUCLEAR     FEW SQUAMOUS EPITHELIAL CELLS PRESENT     RARE GRAM POSITIVE COCCI IN PAIRS     IN CLUSTERS   Culture     Final   Value: FEW METHICILLIN RESISTANT STAPHYLOCOCCUS AUREUS     Note: RIFAMPIN AND GENTAMICIN SHOULD NOT BE USED AS SINGLE DRUGS FOR TREATMENT OF STAPH INFECTIONS. This organism is presumed to be Clindamycin resistant based on detection of inducible Clindamycin resistance. CRITICAL RESULT CALLED TO, READ BACK BY AND      VERIFIED WITH: KATLIN K 3/17 @945  BY REAMM   Report Status 09/11/2012 FINAL   Final   Organism ID, Bacteria METHICILLIN RESISTANT STAPHYLOCOCCUS AUREUS   Final  ANAEROBIC CULTURE     Status: None   Collection Time    09/08/12  2:00 PM      Result Value Range Status   Specimen  Description ARM LEFT ELBOW   Final   Special Requests NONE   Final   Gram Stain     Final   Value: RARE WBC PRESENT,BOTH PMN AND MONONUCLEAR     FEW SQUAMOUS EPITHELIAL CELLS PRESENT     RARE GRAM POSITIVE COCCI IN PAIRS     IN CLUSTERS   Culture     Final   Value: NO ANAEROBES ISOLATED; CULTURE IN PROGRESS FOR 5 DAYS   Report Status PENDING   Incomplete     CBC w Diff: Lab Results  Component Value Date   WBC 5.9 09/10/2012   HGB 11.1* 09/10/2012   HCT 32.6* 09/10/2012   PLT 311 09/10/2012   LYMPHOPCT 32 09/08/2012   MONOPCT 10 09/08/2012   EOSPCT 1 09/08/2012   BASOPCT 0 09/08/2012    CMP: Lab Results  Component Value Date   NA 136 09/10/2012   K 4.3 09/10/2012   CL 101 09/10/2012   CO2 29 09/10/2012   BUN 14 09/10/2012   CREATININE 0.75 09/10/2012   PROT 6.5 09/08/2012   ALBUMIN 2.9* 09/08/2012   BILITOT 0.2* 09/08/2012   ALKPHOS 157* 09/08/2012   AST 48* 09/08/2012   ALT 43 09/08/2012  .   Discharge Instructions       Keep left elbow dressing dry change dressing every 3 days. Return to see Dr. Jerl Santos in 2 weeks. Call [phone] for appointment.   Follow with Primary MD  in 7 days   Get CBC, CMP, checked 7 days by Primary MD and again as instructed by your Primary MD.   Get Medicines reviewed and adjusted.  Please request your Prim.MD to go over all Hospital Tests and Procedure/Radiological results at the follow up, please get all Hospital records sent to your Prim MD by signing hospital release before you go home.  Activity: As tolerated with Full fall precautions use walker/cane & assistance as needed   Diet:  Cardiac Enzymes  For Heart failure patients - Check your Weight same time everyday, if you gain over 2 pounds, or you develop in leg swelling, experience more shortness of breath or chest pain, call your Primary MD immediately. Follow Cardiac Low Salt Diet and 1.8 lit/day fluid restriction.  Disposition ALF  If you experience worsening of your admission  symptoms, develop shortness of breath, life threatening emergency, suicidal or homicidal thoughts you must seek medical attention immediately by calling 911 or calling your MD immediately  if symptoms less severe.  You Must read complete instructions/literature along with all the possible adverse reactions/side effects for all the Medicines you take and that  have been prescribed to you. Take any new Medicines after you have completely understood and accpet all the possible adverse reactions/side effects.   Do not drive and provide baby sitting services if your were admitted for syncope or siezures until you have seen by Primary MD or a Neurologist and advised to do so again.  Do not drive when taking Pain medications.    Do not take more than prescribed Pain, Sleep and Anxiety Medications  Special Instructions: If you have smoked or chewed Tobacco  in the last 2 yrs please stop smoking, stop any regular Alcohol  and or any Recreational drug use.  Wear Seat belts while driving.      Follow-up Information   Follow up with DALLDORF,PETER G, MD. Call in 2 weeks.   Contact information:   [address] [place] [address] [phone]       Follow up with PCP. Schedule an appointment as soon as possible for a visit in 1 week.      Follow up with [facility]. (Home Health RN)    Contact information:   [phone]        Discharge Medications     Medication List    STOP taking these medications       sulfamethoxazole-trimethoprim 800-160 MG per tablet  Commonly known as:  BACTRIM DS      TAKE these medications       acetaminophen 500 MG tablet  Commonly known as:  TYLENOL  Take 1,000 mg by mouth at bedtime. For pain     diclofenac sodium 1 % Gel  Commonly known as:  VOLTAREN  Apply 2 g topically 2 (two) times daily.     docusate sodium 100 MG capsule  Commonly known as:  COLACE  Take 100 mg by mouth every evening.     doxycycline 50 MG capsule  Commonly  known as:  VIBRAMYCIN  Take 2 capsules (100 mg total) by mouth 2 (two) times daily.     HYDROcodone-acetaminophen 5-325 MG per tablet  Commonly known as:  NORCO/VICODIN  Take 2 tablets by mouth every 8 (eight) hours as needed for pain.     lactose free nutrition Liqd  Take 237 mLs by mouth 3 (three) times daily with meals.     lisinopril 10 MG tablet  Commonly known as:  PRINIVIL,ZESTRIL  Take 10 mg by mouth daily.     magnesium oxide 400 MG tablet  Commonly known as:  MAG-OX  Take 400 mg by mouth at bedtime. For leg cramps     Melatonin 3 MG Caps  Take 1 capsule by mouth at bedtime.     multivitamin with minerals Tabs  Take 1 tablet by mouth daily.     mupirocin cream 2 %  Commonly known as:  BACTROBAN  Apply topically 2 (two) times daily.     omeprazole 20 MG capsule  Commonly known as:  PRILOSEC  Take 20 mg by mouth daily.     rOPINIRole 0.25 MG tablet  Commonly known as:  REQUIP  Take 0.25 mg by mouth at bedtime.     senna 8.6 MG Tabs  Commonly known as:  SENOKOT  Take 1 tablet by mouth at bedtime.           Total Time in preparing paper work, data evaluation and todays exam - 35 minutes  Leroy Sea M.D on 09/11/2012 at 12:58 PM  [facility] Office  [phone]

## 2012-09-10 NOTE — Progress Notes (Signed)
D/C put on hold until tomorrow. Medication nurse At [facility] not there today, preventing him from going back. Ortho MD in and changed dressing to LT elbow.

## 2012-09-10 NOTE — Plan of Care (Signed)
Problem: Phase II Progression Outcomes Goal: Discharge plan established Outcome: Adequate for Discharge Dc 09/12/11

## 2012-09-10 NOTE — Progress Notes (Signed)
Patient comfortable. Pain well controlled.  BP 114/66  Pulse 67  Temp(Src) 97.7 F (36.5 C) (Oral)  Resp 18  Ht 6\' 6"  (1.981 m)  Wt 68.6 kg (151 lb 3.8 oz)  BMI 17.48 kg/m2  SpO2 100%  NVI Dressing removed and drain d/c'ed. Copious amount of dried blood about the area of the olecranon and incision, but not actively bleeding currently. No warmth or erythema. Incision intact with sutures in place. Dressing re-applied.  POD #3 after left olecrenon bursectomy  - cont abx per medical team - OK to d/c from an orthopedic standpoint.

## 2012-09-10 NOTE — Progress Notes (Addendum)
Per MD, Pt ready for d/c.  Notified RN and facility.  Sent d/c summary and FL2.  Contacted by [facility] that the facility cannot accept Pt today, as their "Residential Coordinator" is not working today; they can't get Pt's meds.  CSW explained that Pt is a resident and that he only has 2 new meds.  Told, again, that the "Residential Coordinator" is the only one who can admit Pt.  Notified CSW Asst. Director, Wandra Mannan.  Notified RN, MD and Pt.  Chart copy in [place].  Weekday CSW to f/u.  Providence Crosby, LCSWA Clinical Social Work [phone]

## 2012-09-11 ENCOUNTER — Encounter (HOSPITAL_COMMUNITY): Payer: Self-pay | Admitting: Orthopaedic Surgery

## 2012-09-11 LAB — CULTURE, ROUTINE-ABSCESS

## 2012-09-11 MED ORDER — MUPIROCIN CALCIUM 2 % EX CREA: TOPICAL_CREAM | Freq: Two times a day (BID) | CUTANEOUS | Status: DC

## 2012-09-11 MED ORDER — MUPIROCIN CALCIUM 2 % EX CREA
TOPICAL_CREAM | Freq: Two times a day (BID) | CUTANEOUS | Status: DC
Start: 2012-09-11 — End: 2012-09-11

## 2012-09-11 NOTE — Progress Notes (Signed)
[facility]                                                                                Patient Demographics  [name], is a 62 y.o. male, DOB - [birth year], ZOX:[id number], WJX:[id number]  Admit date - 09/07/2012  Admitting Physician Eduard Clos, MD  Outpatient Primary MD for the patient is No primary provider on file.  LOS - 4   Chief Complaint  Patient presents with  . Elbow Pain    Left        Assessment & Plan    1. Septic olecranon bursitis - patient will be kept n.p.o. past midnight in anticipation of possible surgery. Orthopedic surgeon has been consulted and I&D done on 09/08/2012. Patient will be continued on IV vancomycin as patient's recent aspiration grew MRSA. DC to ALF.   2. Hypertension - continue present medications.   3. 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 Chronic weakness and largely bedbound minimal mobility with walker lives at assisted living facility.   4. History of generalized weakness and GERD. No acute issues outpatient followup.    Code Status: Full  Family Communication: Patient no family around  Disposition Plan: TBD   Procedures  I&D L Elbow on 09-08-12   Consults  Ortho   DVT Prophylaxis  SCDs  Lab Results  Component Value Date   PLT 311 09/10/2012    Medications  Scheduled Meds: . diclofenac sodium  2 g Topical BID  . docusate sodium  100 mg Oral QPM  . lactose free nutrition  237 mL Oral TID WC  . lisinopril  10 mg Oral Daily  . magnesium oxide  400 mg Oral QHS  . nutrition supplement  1 packet Oral BID BM  . pantoprazole  40 mg Oral Daily  . rOPINIRole  0.25 mg Oral QHS  . senna  1 tablet Oral QHS  . vancomycin  750 mg Intravenous Q8H   Continuous Infusions:   PRN Meds:.acetaminophen, acetaminophen, HYDROcodone-acetaminophen, morphine injection, ondansetron (ZOFRAN) IV, ondansetron  Antibiotics    Anti-infectives   Start     Dose/Rate Route Frequency Ordered Stop    09/10/12 0000  doxycycline (VIBRAMYCIN) 50 MG capsule     100 mg Oral 2 times daily 09/10/12 1055     09/08/12 1403  polymyxin B 500,000 Units, bacitracin 50,000 Units in sodium chloride irrigation 0.9 % 500 mL irrigation  Status:  Discontinued       As needed 09/08/12 1403 09/08/12 1438   09/07/12 2000  vancomycin (VANCOCIN) 750 mg in sodium chloride 0.9 % 150 mL IVPB     750 mg 150 mL/hr over 60 Minutes Intravenous Every 8 hours 09/07/12 1932         Time Spent in minutes  35   Kaleena Corrow K M.D on 09/11/2012 at 10:19 AM  Between 7am to 7pm - Pager - [phone]  After 7pm go to [email or web address] - password [email or web address]  And look for the night coverage person covering for me after hours  [facility] Office  [phone]    Subjective:   [name] today has, No headache, No chest pain, No abdominal pain - No  Nausea, No new weakness tingling or numbness, No Cough - SOB. L.elbow pain.  Objective:   Filed Vitals:   09/10/12 1014 09/10/12 1420 09/10/12 2134 09/11/12 0637  BP: 121/67 117/66 135/76 117/55  Pulse:  82 96 71  Temp:  98.7 F (37.1 C) 98.3 F (36.8 C) 97.9 F (36.6 C)  TempSrc:  Oral Oral Oral  Resp:  16 18 18   Height:      Weight:      SpO2:  100% 98% 100%    Wt Readings from Last 3 Encounters:  09/07/12 68.6 kg (151 lb 3.8 oz)  09/07/12 68.6 kg (151 lb 3.8 oz)  09/02/12 66.679 kg (147 lb)     Intake/Output Summary (Last 24 hours) at 09/11/12 1019 Last data filed at 09/11/12 0639  Gross per 24 hour  Intake   2697 ml  Output   2445 ml  Net    252 ml    Exam Awake Alert, Oriented X 3, No new F.N deficits, Normal affect [place].AT,PERRAL Supple Neck,No JVD, No cervical lymphadenopathy appriciated.  Symmetrical Chest wall movement, Good air movement bilaterally, CTAB RRR,No Gallops,Rubs or new Murmurs, No Parasternal Heave +ve B.Sounds, Abd Soft, Non tender, No organomegaly appriciated, No rebound - guarding or rigidity. No Cyanosis,  Clubbing or edema, No new Rash or bruise ,   L elbow under bandage   Data Review   Micro Results Recent Results (from the past 240 hour(s))  BODY FLUID CULTURE     Status: None   Collection Time    09/02/12  9:07 PM      Result Value Range Status   Specimen Description ARM LEFT ELBOW   Final   Special Requests NONE   Final   Gram Stain     Final   Value: WBC PRESENT, PREDOMINANTLY PMN     GRAM POSITIVE COCCI IN PAIRS     IN CLUSTERS   Culture     Final   Value: MODERATE METHICILLIN RESISTANT STAPHYLOCOCCUS AUREUS     Note: RIFAMPIN AND GENTAMICIN SHOULD NOT BE USED AS SINGLE DRUGS FOR TREATMENT OF STAPH INFECTIONS. This organism is presumed to be Clindamycin resistant based on detection of inducible Clindamycin resistance.   Report Status 09/05/2012 FINAL   Final   Organism ID, Bacteria METHICILLIN RESISTANT STAPHYLOCOCCUS AUREUS   Final  BODY FLUID CULTURE     Status: None   Collection Time    09/03/12  9:42 AM      Result Value Range Status   Specimen Description OTHER ELBOW, OLECRANON BURSITIS   Final   Special Requests Normal   Final   Gram Stain     Final   Value: FEW WBC PRESENT,BOTH PMN AND MONONUCLEAR     NO ORGANISMS SEEN   Culture     Final   Value: MODERATE METHICILLIN RESISTANT STAPHYLOCOCCUS AUREUS     Note: RIFAMPIN AND GENTAMICIN SHOULD NOT BE USED AS SINGLE DRUGS FOR TREATMENT OF STAPH INFECTIONS. This organism is presumed to be Clindamycin resistant based on detection of inducible Clindamycin resistance.   Report Status 09/05/2012 FINAL   Final   Organism ID, Bacteria METHICILLIN RESISTANT STAPHYLOCOCCUS AUREUS   Final  CULTURE, BLOOD (ROUTINE X 2)     Status: None   Collection Time    09/07/12  6:45 PM      Result Value Range Status   Specimen Description BLOOD RIGHT ARM   Final   Special Requests BOTTLES DRAWN AEROBIC ONLY 3CC   Final  Culture  Setup Time 09/07/2012 22:44   Final   Culture     Final   Value:        BLOOD CULTURE RECEIVED NO GROWTH TO  DATE CULTURE WILL BE HELD FOR 5 DAYS BEFORE ISSUING A FINAL NEGATIVE REPORT   Report Status PENDING   Incomplete  CULTURE, BLOOD (ROUTINE X 2)     Status: None   Collection Time    09/07/12  6:50 PM      Result Value Range Status   Specimen Description BLOOD LEFT ARM   Final   Special Requests BOTTLES DRAWN AEROBIC AND ANAEROBIC 5CC EACH   Final   Culture  Setup Time 09/07/2012 22:43   Final   Culture     Final   Value:        BLOOD CULTURE RECEIVED NO GROWTH TO DATE CULTURE WILL BE HELD FOR 5 DAYS BEFORE ISSUING A FINAL NEGATIVE REPORT   Report Status PENDING   Incomplete  MRSA PCR SCREENING     Status: None   Collection Time    09/08/12  5:35 AM      Result Value Range Status   MRSA by PCR NEGATIVE  NEGATIVE Final   Comment:            The GeneXpert MRSA Assay (FDA     approved for NASAL specimens     only), is one component of a     comprehensive MRSA colonization     surveillance program. It is not     intended to diagnose MRSA     infection nor to guide or     monitor treatment for     MRSA infections.  CULTURE, ROUTINE-ABSCESS     Status: None   Collection Time    09/08/12  2:00 PM      Result Value Range Status   Specimen Description ARM LEFT ELBOW   Final   Special Requests NONE   Final   Gram Stain     Final   Value: RARE WBC PRESENT,BOTH PMN AND MONONUCLEAR     FEW SQUAMOUS EPITHELIAL CELLS PRESENT     RARE GRAM POSITIVE COCCI IN PAIRS     IN CLUSTERS   Culture     Final   Value: FEW METHICILLIN RESISTANT STAPHYLOCOCCUS AUREUS     Note: RIFAMPIN AND GENTAMICIN SHOULD NOT BE USED AS SINGLE DRUGS FOR TREATMENT OF STAPH INFECTIONS. This organism is presumed to be Clindamycin resistant based on detection of inducible Clindamycin resistance. CRITICAL RESULT CALLED TO, READ BACK BY AND      VERIFIED WITH: KATLIN K 3/17 @945  BY REAMM   Report Status 09/11/2012 FINAL   Final   Organism ID, Bacteria METHICILLIN RESISTANT STAPHYLOCOCCUS AUREUS   Final  ANAEROBIC CULTURE      Status: None   Collection Time    09/08/12  2:00 PM      Result Value Range Status   Specimen Description ARM LEFT ELBOW   Final   Special Requests NONE   Final   Gram Stain     Final   Value: RARE WBC PRESENT,BOTH PMN AND MONONUCLEAR     FEW SQUAMOUS EPITHELIAL CELLS PRESENT     RARE GRAM POSITIVE COCCI IN PAIRS     IN CLUSTERS   Culture     Final   Value: NO ANAEROBES ISOLATED; CULTURE IN PROGRESS FOR 5 DAYS   Report Status PENDING   Incomplete    Radiology Reports Dg Sacrum/coccyx  08/18/2012  *RADIOLOGY REPORT*  Clinical Data: History of fall with pain in the region of the coccyx.  SACRUM AND COCCYX - 2+ VIEW  Comparison: No priors.  Findings: AP and lateral views of the sacrum and coccyx demonstrate no definite acute displaced fractures.  Visualized portions of the remaining pelvis also show no acute displaced fractures, but demonstrates moderate to severe bilateral hip joint osteoarthritis.  IMPRESSION: 1.  No acute radiographic abnormality of the sacrum or coccyx.   Original Report Authenticated By: Trudie Reed, M.D.    Dg Elbow Complete Left  09/02/2012  *RADIOLOGY REPORT*  Clinical Data: Insect bite, cellulitis, left posterior elbow  LEFT ELBOW - COMPLETE 3+ VIEW  Comparison: 02/24/2011  Findings: No fracture or dislocation is seen.  Moderate degenerative changes with olecranon spurring.  No displaced elbow joint fat pads to suggest an elbow joint effusion.  Severe soft tissue swelling overlying the olecranon.  No radiopaque foreign body is seen.  IMPRESSION: Severe soft tissue swelling overlying the olecranon.  No radiopaque foreign body is seen.  Moderate degenerative changes.   Original Report Authenticated By: Charline Bills, M.D.    Dg Hip Complete Left  08/18/2012  *RADIOLOGY REPORT*  Clinical Data: History of fall 2 days ago complaining of left-sided hip pain.  LEFT HIP - COMPLETE 2+ VIEW  Comparison: No priors.  Findings: AP view of the pelvis and AP and lateral views  of the left hip demonstrate no acute displaced fracture, subluxation, dislocation, joint or soft tissue abnormality.  The hip joints bilaterally and there is joint space narrowing, subchondral sclerosis, subchondral cyst formation and osteophyte formation, compatible with moderate to severe osteoarthritis.  IMPRESSION: 1.  No acute radiographic abnormality of the bony pelvis or the left hip. 2.  Moderate to severe bilateral hip joint osteoarthritis.   Original Report Authenticated By: Trudie Reed, M.D.     CBC  Recent Labs Lab 09/07/12 1954 09/07/12 2000 09/08/12 0410 09/10/12 0359  WBC 5.0 5.4 5.3 5.9  HGB 13.2 13.5 12.5* 11.1*  HCT 37.9* 39.0 36.1* 32.6*  PLT 331 322 330 311  MCV 92.4 92.9 92.6 93.4  MCH 32.2 32.1 32.1 31.8  MCHC 34.8 34.6 34.6 34.0  RDW 12.1 12.2 12.4 12.7  LYMPHSABS 1.5 1.6 1.7  --   MONOABS 0.6 0.5 0.5  --   EOSABS 0.0 0.0 0.1  --   BASOSABS 0.0 0.0 0.0  --     Chemistries   Recent Labs Lab 09/07/12 1906 09/08/12 0410 09/10/12 0359  NA 138 138 136  K 4.1 3.8 4.3  CL 104 106 101  CO2 27 27 29   GLUCOSE 88 107* 101*  BUN 9 8 14   CREATININE 0.73 0.76 0.75  CALCIUM 8.8 8.5 8.5  AST  --  48*  --   ALT  --  43  --   ALKPHOS  --  157*  --   BILITOT  --  0.2*  --    ------------------------------------------------------------------------------------------------------------------ estimated creatinine clearance is 96.5 ml/min (by C-G formula based on Cr of 0.75). ------------------------------------------------------------------------------------------------------------------ No results found for this basename: HGBA1C,  in the last 72 hours ------------------------------------------------------------------------------------------------------------------ No results found for this basename: CHOL, HDL, LDLCALC, TRIG, CHOLHDL, LDLDIRECT,  in the last 72  hours ------------------------------------------------------------------------------------------------------------------ No results found for this basename: TSH, T4TOTAL, FREET3, T3FREE, THYROIDAB,  in the last 72 hours ------------------------------------------------------------------------------------------------------------------ No results found for this basename: VITAMINB12, FOLATE, FERRITIN, TIBC, IRON, RETICCTPCT,  in the last 72 hours  Coagulation profile  Recent Labs  Lab 09/08/12 0410  INR 1.00    No results found for this basename: DDIMER,  in the last 72 hours  Cardiac Enzymes No results found for this basename: CK, CKMB, TROPONINI, MYOGLOBIN,  in the last 168 hours ------------------------------------------------------------------------------------------------------------------ No components found with this basename: POCBNP,

## 2012-09-11 NOTE — Progress Notes (Signed)
CRITICAL VALUE ALERT  Critical value received:  Wound Culture Positive for MRSA   Date of notification:  09/12/15  Time of notification: 0950  Critical value read back: yes  Nurse who received alert:  Farley Ly   MD notified (1st page):  Thedore Mins   Time of first page:  [phone]  MD notified (2nd page):  Time of second page:  Responding MD:  Thedore Mins  Time MD responded: [phone]

## 2012-09-11 NOTE — Progress Notes (Signed)
Pt for discharge back to [facility].   CSW spoke with ALF who confirmed that they received paperwork yesterday, but needed signed FL2 faxed to ALF.  CSW faxed signed FL2 and confirmed that pt can return.  CSW discussed with pt at bedside and arranged ambulance transportation for pt to [facility].   No further social work needs identified.   CSW signing off.   Jacklynn Lewis, MSW, LCSWA  Clinical Social Work [phone]

## 2012-09-13 LAB — ANAEROBIC CULTURE

## 2012-09-13 LAB — CULTURE, BLOOD (ROUTINE X 2): Culture: NO GROWTH

## 2012-10-02 ENCOUNTER — Other Ambulatory Visit: Payer: Self-pay | Admitting: *Deleted

## 2012-10-02 MED ORDER — HYDROCODONE-ACETAMINOPHEN 5-325 MG PO TABS: ORAL_TABLET | ORAL | Status: DC

## 2012-10-08 ENCOUNTER — Non-Acute Institutional Stay (SKILLED_NURSING_FACILITY): Payer: Medicaid Other | Admitting: Internal Medicine

## 2012-10-08 ENCOUNTER — Encounter: Payer: Self-pay | Admitting: Internal Medicine

## 2012-10-08 DIAGNOSIS — G2581 Restless legs syndrome: Secondary | ICD-10-CM | POA: Insufficient documentation

## 2012-10-08 DIAGNOSIS — M71122 Other infective bursitis, left elbow: Secondary | ICD-10-CM

## 2012-10-08 DIAGNOSIS — A499 Bacterial infection, unspecified: Secondary | ICD-10-CM

## 2012-10-08 DIAGNOSIS — K5900 Constipation, unspecified: Secondary | ICD-10-CM

## 2012-10-08 DIAGNOSIS — Z8719 Personal history of other diseases of the digestive system: Secondary | ICD-10-CM

## 2012-10-08 DIAGNOSIS — L89109 Pressure ulcer of unspecified part of back, unspecified stage: Secondary | ICD-10-CM

## 2012-10-08 DIAGNOSIS — M7020 Olecranon bursitis, unspecified elbow: Secondary | ICD-10-CM

## 2012-10-08 DIAGNOSIS — K5909 Other constipation: Secondary | ICD-10-CM

## 2012-10-08 DIAGNOSIS — I10 Essential (primary) hypertension: Secondary | ICD-10-CM

## 2012-10-08 DIAGNOSIS — G35 Multiple sclerosis: Secondary | ICD-10-CM

## 2012-10-08 DIAGNOSIS — L8993 Pressure ulcer of unspecified site, stage 3: Secondary | ICD-10-CM

## 2012-10-08 DIAGNOSIS — L89153 Pressure ulcer of sacral region, stage 3: Secondary | ICD-10-CM

## 2012-10-08 DIAGNOSIS — B9689 Other specified bacterial agents as the cause of diseases classified elsewhere: Secondary | ICD-10-CM

## 2012-10-08 NOTE — Assessment & Plan Note (Signed)
Here for wound care and therapy.  He requires skilled care.

## 2012-10-08 NOTE — Assessment & Plan Note (Addendum)
Continues on ropinirole.  Follows with neurology.  Consider checking ferritin level at next visit.

## 2012-10-08 NOTE — Assessment & Plan Note (Signed)
Continue senna and colace.  If these are inadequate, will add miralax, as needed.

## 2012-10-08 NOTE — Assessment & Plan Note (Signed)
BP at goal <140/90

## 2012-10-08 NOTE — Assessment & Plan Note (Signed)
On chronic omeprazole therapy.  Avoid nsaids.

## 2012-10-08 NOTE — Assessment & Plan Note (Signed)
Not currently on any medications for his MS.  Has had white matter changes on his brain imaging.  On brief review, I was unable to find a note to clarify the type of MS he has or what previous treatments he's received.  (May not be in epic).

## 2012-10-08 NOTE — Progress Notes (Signed)
Patient ID: [name], male   DOB: 03-04-1951, 62 y.o.   MRN: [id number]   PCP: No primary provider on file.Was living at [facility] and followed with Dr. Florentina Jenny  Code Status: Full code?  Needs assessment  Allergies  Allergen Reactions  . Aspirin Other (See Comments)    Noted on MAR. Reaction unknown  . Ibuprofen Other (See Comments)    Noted on MAR. Reaction unknown    Chief Complaint: new admission s/p hospitalization with septic olecranon bursitis  HPI: 62 yo male with h/o multiple sclerosis, chronic constipation, and sacral decubitus ulcer was admitted to [facility] for STR.  He had been hospitalized for acute elbow pain and was found to have septic olecranon bursitis.  He underwent aspiration serially and then was managed with IV vancomycin followed by oral Bactrim. Unfortunately, his problem continued and he required more aggressive irrigation and debridement with olecranon bursa excision on 3/14 by Dr. Jerl Santos. He has now completed a course of doxycycline.    Review of Systems:  Review of Systems  Constitutional: Negative for fever and chills.  HENT: Negative for sore throat.   Eyes: Negative for blurred vision.  Respiratory: Negative for shortness of breath.   Cardiovascular: Negative for chest pain.  Gastrointestinal: Positive for constipation. Negative for nausea, vomiting and abdominal pain.  Genitourinary: Negative for dysuria.  Musculoskeletal: Positive for joint pain and falls.       No recent falls  Skin: Negative for rash.       Stage III pressure ulcer sacrum, incision site over left elbow with dressing  Neurological: Positive for weakness. Negative for headaches.       Multiple sclerosis  Psychiatric/Behavioral: Positive for memory loss.     Past Medical History  Diagnosis Date  . Hypertension   . Duodenal ulcer hemorrhage   . Hypertrophy of prostate   . Generalized muscle weakness   . Acute respiratory failure   . Cognitive communication  deficit   . MRSA (methicillin resistant Staphylococcus aureus)   . Kidney stone    Past Surgical History  Procedure Laterality Date  . Knee arthroscopy      right  . Total knee arthroplasty      right  . Wrist surgery      right  . Neck surgery    . Exploratory laparotomy  02/2011    Duodenotomy, oversew of gastric ulcer/duodenal artery  . Appendectomy    . Tibia fracture surgery    . Olecranon bursectomy Left 09/08/2012    Procedure: OLECRANON BURSA;  Surgeon: Velna Ochs, MD;  Location: [facility] ORS;  Service: Orthopedics;  Laterality: Left;  LEFT  ELBOW  OLECRANON  BURSA  INCISION   Social History:   reports that he quit smoking about 19 months ago. He has never used smokeless tobacco. He reports that he does not drink alcohol or use illicit drugs.  Family History  Problem Relation Age of Onset  . Hypertension Father     Medications: Patient's Medications  New Prescriptions   No medications on file  Previous Medications   ACETAMINOPHEN (TYLENOL) 500 MG TABLET    Take 1,000 mg by mouth at bedtime. For pain   DICLOFENAC SODIUM (VOLTAREN) 1 % GEL    Apply 2 g topically 2 (two) times daily.   DOCUSATE SODIUM (COLACE) 100 MG CAPSULE    Take 100 mg by mouth every evening.    HYDROCODONE-ACETAMINOPHEN (NORCO/VICODIN) 5-325 MG PER TABLET    Take one tablet every  6 hours as needed for pain   LACTOSE FREE NUTRITION (BOOST PLUS) LIQD    Take 237 mLs by mouth 3 (three) times daily with meals.   LISINOPRIL (PRINIVIL,ZESTRIL) 10 MG TABLET    Take 10 mg by mouth daily.    MAGNESIUM OXIDE (MAG-OX) 400 MG TABLET    Take 400 mg by mouth at bedtime. For leg cramps   MELATONIN 3 MG CAPS    Take 1 capsule by mouth at bedtime.   MULTIPLE VITAMIN (MULTIVITAMIN WITH MINERALS) TABS    Take 1 tablet by mouth daily.   OMEPRAZOLE (PRILOSEC) 20 MG CAPSULE    Take 20 mg by mouth daily.    ROPINIROLE (REQUIP) 0.25 MG TABLET    Take 0.25 mg by mouth at bedtime.   SENNA (SENOKOT) 8.6 MG TABS    Take 1  tablet by mouth at bedtime.   Modified Medications   No medications on file  Discontinued Medications   DOXYCYCLINE (VIBRAMYCIN) 50 MG CAPSULE    Take 2 capsules (100 mg total) by mouth 2 (two) times daily.   MUPIROCIN CREAM (BACTROBAN) 2 %    Apply topically 2 (two) times daily.   Physical Exam: Filed Vitals:   10/04/12 2109  BP: 136/84  Pulse: 84  Temp: 96.7 F (35.9 C)  Resp: 18  SpO2: 100%  Physical Exam  Constitutional: No distress.  Frail appearing AA male  HENT:  Head: Normocephalic and atraumatic.  Nose: Nose normal.  Mouth/Throat: Oropharynx is clear and moist.  Eyes: EOM are normal. Pupils are equal, round, and reactive to light.  Neck: No JVD present. No thyromegaly present.  Cardiovascular: Normal rate, regular rhythm, normal heart sounds and intact distal pulses.   Pulmonary/Chest: Effort normal and breath sounds normal. No respiratory distress.  Abdominal: Soft. Bowel sounds are normal. He exhibits no distension and no mass. There is no tenderness.  Musculoskeletal: He exhibits tenderness. He exhibits no edema.  Left elbow tender and painful with ROM, dressing in place  Neurological: He is alert.  Oriented to person  Skin: He is not diaphoretic.  Stage III sacral decubitus ulcer, left elbow incision   Psychiatric:  Unusual affect    Labs reviewed: Basic Metabolic Panel:  Recent Labs  40/98/11 1906 09/08/12 0410 09/10/12 0359  NA 138 138 136  K 4.1 3.8 4.3  CL 104 106 101  CO2 27 27 29   GLUCOSE 88 107* 101*  BUN 9 8 14   CREATININE 0.73 0.76 0.75  CALCIUM 8.8 8.5 8.5   Liver Function Tests:  Recent Labs  06/26/12 2316 09/08/12 0410  AST 40* 48*  ALT 37 43  ALKPHOS 87 157*  BILITOT 0.5 0.2*  PROT 7.3 6.5  ALBUMIN 3.6 2.9*    Recent Labs  06/26/12 2316  LIPASE 49   CBC:  Recent Labs  09/07/12 1954 09/07/12 2000 09/08/12 0410 09/10/12 0359  WBC 5.0 5.4 5.3 5.9  NEUTROABS 2.9 3.2 3.0  --   HGB 13.2 13.5 12.5* 11.1*  HCT  37.9* 39.0 36.1* 32.6*  MCV 92.4 92.9 92.6 93.4  PLT 331 322 330 311  Radiological Exams: 05/13/12:  CT head:  Bandage overlies the right ear. No displaced calvarial fracture.  Cerebral and cerebellar volume loss is similar to prior. Mild ventriculomegaly may be secondary to the volume loss, appears similar to priors. Mild white matter changes as above.  08/18/12:  Sacrum/coccyx xray:  1. No acute radiographic abnormality of the sacrum or coccyx.  08/18/12:  Left  hip xray:  1. No acute radiographic abnormality of the bony pelvis or the  left hip. 2. Moderate to severe bilateral hip joint osteoarthritis.  09/02/12:  Left elbow xray:  Severe soft tissue swelling overlying the olecranon. No radiopaque foreign body is seen. Moderate degenerative changes.  Assessment/Plan MULTIPLE SCLEROSIS Not currently on any medications for his MS.  Has had white matter changes on his brain imaging.  On brief review, I was unable to find a note to clarify the type of MS he has or what previous treatments he's received.  (May not be in epic).  Septic olecranon bursitis Had aggressive I&D and removal of infected bursa by Dr. Jerl Santos.  Now at [facility] for rehab and wound care--continue dressings as per treatment nurse.  Pain is improved.  Sacral decubitus ulcer Here for wound care and therapy.  He requires skilled care.    History of duodenal ulcer, required surgery 03/07/2011. On chronic omeprazole therapy.  Avoid nsaids.    Chronic constipation Continue senna and colace.  If these are inadequate, will add miralax, as needed.  Hypertension BP at goal <140/90.  Restless leg syndrome Continues on ropinirole.  Follows with neurology.  Consider checking ferritin level at next visit.   Rehab potential:  Limited by cognition and baseline MS, likely a long term care resident  Family/ staff Communication: discussed case with nursing staff, no family was present

## 2012-10-08 NOTE — Assessment & Plan Note (Signed)
Had aggressive I&D and removal of infected bursa by Dr. Jerl Santos.  Now at [facility] for rehab and wound care--continue dressings as per treatment nurse.  Pain is improved.

## 2012-10-17 ENCOUNTER — Non-Acute Institutional Stay (SKILLED_NURSING_FACILITY): Payer: Medicaid Other | Admitting: Adult Health

## 2012-10-17 ENCOUNTER — Encounter: Payer: Self-pay | Admitting: Adult Health

## 2012-10-17 DIAGNOSIS — M7022 Olecranon bursitis, left elbow: Secondary | ICD-10-CM

## 2012-10-17 DIAGNOSIS — M7020 Olecranon bursitis, unspecified elbow: Secondary | ICD-10-CM

## 2012-10-17 NOTE — Progress Notes (Signed)
Patient ID: [name], male   DOB: 1951/01/29, 62 y.o.   MRN: [id number]  Chief Complaint  Patient presents with  . Acute Visit    wound management     HPI:   He has a left elbow wound due to an olecranon excision; he had an infection for which he was treated with doxycycline. There are no signs of infection present. There are no complaints of pain present. The left elbow wound bed is pink with hyper growth present. I have spoken with Dr. Leanord Hawking regarding the best coarse of treatment   Past Medical History  Diagnosis Date  . Hypertension   . Duodenal ulcer hemorrhage   . Hypertrophy of prostate   . Generalized muscle weakness   . Acute respiratory failure   . Cognitive communication deficit   . MRSA (methicillin resistant Staphylococcus aureus)   . Kidney stone     Past Surgical History  Procedure Laterality Date  . Knee arthroscopy      right  . Total knee arthroplasty      right  . Wrist surgery      right  . Neck surgery    . Exploratory laparotomy  02/2011    Duodenotomy, oversew of gastric ulcer/duodenal artery  . Appendectomy    . Tibia fracture surgery    . Olecranon bursectomy Left 09/08/2012    Procedure: OLECRANON BURSA;  Surgeon: Velna Ochs, MD;  Location: [facility];  Service: Orthopedics;  Laterality: Left;  LEFT  ELBOW  OLECRANON  BURSA  INCISION    VITAL SIGNS There were no vitals taken for this visit.   Patient's Medications  New Prescriptions   No medications on file  Previous Medications   ACETAMINOPHEN (TYLENOL) 500 MG TABLET    Take 1,000 mg by mouth at bedtime. For pain   DOCUSATE SODIUM (COLACE) 100 MG CAPSULE    Take 100 mg by mouth every evening.    HYDROCODONE-ACETAMINOPHEN (NORCO/VICODIN) 5-325 MG PER TABLET    Take one tablet every 6 hours as needed for pain   LACTOSE FREE NUTRITION (BOOST PLUS) LIQD    Take 237 mLs by mouth 3 (three) times daily with meals.   LISINOPRIL (PRINIVIL,ZESTRIL) 10 MG TABLET    Take 10 mg by mouth daily.     MAGNESIUM OXIDE (MAG-OX) 400 MG TABLET    Take 400 mg by mouth at bedtime. For leg cramps   MELATONIN 3 MG CAPS    Take 1 capsule by mouth at bedtime.   MULTIPLE VITAMIN (MULTIVITAMIN WITH MINERALS) TABS    Take 1 tablet by mouth daily.   OMEPRAZOLE (PRILOSEC) 20 MG CAPSULE    Take 20 mg by mouth daily.    ROPINIROLE (REQUIP) 0.25 MG TABLET    Take 0.25 mg by mouth at bedtime.   SENNA (SENOKOT) 8.6 MG TABS    Take 1 tablet by mouth at bedtime.   Modified Medications   No medications on file  Discontinued Medications   DICLOFENAC SODIUM (VOLTAREN) 1 % GEL    Apply 2 g topically 2 (two) times daily.    SIGNIFICANT DIAGNOSTIC EXAMS  07/14/11:  CT head:  Bandage overlies the right ear. No displaced calvarial fracture.  Cerebral and cerebellar volume loss is similar to prior. Mild ventriculomegaly may be secondary to the volume loss, appears similar to priors. Mild white matter changes as above.  08/18/12:  Sacrum/coccyx xray:  1. No acute radiographic abnormality of the sacrum or coccyx.  08/18/12:  Left hip  xray:  1. No acute radiographic abnormality of the bony pelvis or the  left hip. 2. Moderate to severe bilateral hip joint osteoarthritis.  09/02/12:  Left elbow xray:  Severe soft tissue swelling overlying the olecranon. No radiopaque foreign body is seen. Moderate degenerative changes.       Component Value Date/Time   ALBUMIN 2.9* 09/08/2012 0410   AST 48* 09/08/2012 0410   ALT 43 09/08/2012 0410   ALKPHOS 157* 09/08/2012 0410   BILITOT 0.2* 09/08/2012 0410       Component Value Date/Time   BUN 14 09/10/2012 0359   GLUCOSE 101* 09/10/2012 0359   CREATININE 0.75 09/10/2012 0359   K 4.3 09/10/2012 0359   NA 136 09/10/2012 0359   TSH 1.81 12/15/2006 1333       Component Value Date/Time   WBC 5.9 09/10/2012 0359   RBC 3.49* 09/10/2012 0359   HGB 11.1* 09/10/2012 0359   HCT 32.6* 09/10/2012 0359   PLT 311 09/10/2012 0359   MCV 93.4 09/10/2012 0359   Filed Vitals:   10/17/12 1218   BP: 132/76  Pulse: 70  Height: 6' (1.829 m)  Weight: 146 lb (66.225 kg)    Review of Systems  Constitutional: Negative for fever and malaise/fatigue.  Respiratory: Negative for cough and shortness of breath.   Cardiovascular: Negative for chest pain, palpitations and leg swelling.  Gastrointestinal: Negative for heartburn, abdominal pain and constipation.  Musculoskeletal: Negative for myalgias and back pain.  Skin:       Has open area on left elbow and sore on sacrum  Psychiatric/Behavioral: Negative for depression. The patient does not have insomnia.      Physical Exam  Constitutional: He is oriented to person, place, and time.  thin  Neck: Neck supple.  Cardiovascular: Normal rate, regular rhythm and intact distal pulses.   Respiratory: Effort normal and breath sounds normal.  GI: Soft. Bowel sounds are normal.  Musculoskeletal: Normal range of motion.  Neurological: He is alert and oriented to person, place, and time.  Skin: Skin is warm and dry.  Left elbow wound: wound bed is pink there is hyper growth present no signs of infection present.  Sacral wound: 2 small areas present wound bed red treated with duoderm   Psychiatric: He has a normal mood and affect.       ASSESSMENT/ PLAN:   For his left elbow olecranon bursitis excision area will use abt oint daily and will order silver nitrate sticks upon my next visit will use then to reduce the hyper growth and will monitor his status

## 2012-10-24 ENCOUNTER — Non-Acute Institutional Stay (SKILLED_NURSING_FACILITY): Payer: Medicaid Other | Admitting: Adult Health

## 2012-10-24 ENCOUNTER — Encounter: Payer: Self-pay | Admitting: Adult Health

## 2012-10-24 DIAGNOSIS — M71022 Abscess of bursa, left elbow: Secondary | ICD-10-CM

## 2012-10-24 DIAGNOSIS — IMO0002 Reserved for concepts with insufficient information to code with codable children: Secondary | ICD-10-CM

## 2012-10-24 DIAGNOSIS — G4700 Insomnia, unspecified: Secondary | ICD-10-CM

## 2012-10-24 NOTE — Progress Notes (Signed)
Patient ID: [name], male   DOB: 09-11-1950, 62 y.o.   MRN: [id number]  Chief Complaint  Patient presents with  . Acute Visit    wound management    HPI:  He has an open area on his left elbow from a bursa there is hypergrowth present will need to have a silver nitrate treatment performed in order to suppress the hyper-growth.  His sacral wounds are without change in status no signs of infection present He is complaining of insomnia present is taking melatonin 3 mg nightly for his insomnia.   Past Medical History  Diagnosis Date  . Hypertension   . Duodenal ulcer hemorrhage   . Hypertrophy of prostate   . Generalized muscle weakness   . Acute respiratory failure   . Cognitive communication deficit   . MRSA (methicillin resistant Staphylococcus aureus)   . Kidney stone     Past Surgical History  Procedure Laterality Date  . Knee arthroscopy      right  . Total knee arthroplasty      right  . Wrist surgery      right  . Neck surgery    . Exploratory laparotomy  02/2011    Duodenotomy, oversew of gastric ulcer/duodenal artery  . Appendectomy    . Tibia fracture surgery    . Olecranon bursectomy Left 09/08/2012    Procedure: OLECRANON BURSA;  Surgeon: Velna Ochs, MD;  Location: [facility];  Service: Orthopedics;  Laterality: Left;  LEFT  ELBOW  OLECRANON  BURSA  INCISION    VITAL SIGNS BP 120/70  Pulse 68  Ht 5\' 6"  (1.676 m)  Wt 146 lb (66.225 kg)  BMI 23.58 kg/m2   Patient's Medications  New Prescriptions   No medications on file  Previous Medications   ACETAMINOPHEN (TYLENOL) 500 MG TABLET    Take 1,000 mg by mouth at bedtime. For pain   DOCUSATE SODIUM (COLACE) 100 MG CAPSULE    Take 100 mg by mouth every evening.    HYDROCODONE-ACETAMINOPHEN (NORCO/VICODIN) 5-325 MG PER TABLET    Take one tablet every 6 hours as needed for pain   LACTOSE FREE NUTRITION (BOOST PLUS) LIQD    Take 237 mLs by mouth 3 (three) times daily with meals.   LISINOPRIL  (PRINIVIL,ZESTRIL) 10 MG TABLET    Take 10 mg by mouth daily.    MAGNESIUM OXIDE (MAG-OX) 400 MG TABLET    Take 400 mg by mouth at bedtime. For leg cramps   MELATONIN 3 MG CAPS    Take 1 capsule by mouth at bedtime.   MULTIPLE VITAMIN (MULTIVITAMIN WITH MINERALS) TABS    Take 1 tablet by mouth daily.   OMEPRAZOLE (PRILOSEC) 20 MG CAPSULE    Take 20 mg by mouth daily.    ROPINIROLE (REQUIP) 0.25 MG TABLET    Take 0.25 mg by mouth at bedtime.   SENNA (SENOKOT) 8.6 MG TABS    Take 1 tablet by mouth at bedtime.   Modified Medications   No medications on file  Discontinued Medications   No medications on file    SIGNIFICANT DIAGNOSTIC EXAMS  07/14/11:  CT head:  Bandage overlies the right ear. No displaced calvarial fracture.  Cerebral and cerebellar volume loss is similar to prior. Mild ventriculomegaly may be secondary to the volume loss, appears similar to priors. Mild white matter changes as above.  08/18/12:  Sacrum/coccyx xray:  1. No acute radiographic abnormality of the sacrum or coccyx.  08/18/12:  Left hip xray:  1. No acute radiographic abnormality of the bony pelvis or the  left hip. 2. Moderate to severe bilateral hip joint osteoarthritis.  09/02/12:  Left elbow xray:  Severe soft tissue swelling overlying the olecranon. No radiopaque foreign body is seen. Moderate degenerative changes.       Component Value Date/Time   ALBUMIN 2.9* 09/08/2012 0410   AST 48* 09/08/2012 0410   ALT 43 09/08/2012 0410   ALKPHOS 157* 09/08/2012 0410   BILITOT 0.2* 09/08/2012 0410       Component Value Date/Time   BUN 14 09/10/2012 0359   GLUCOSE 101* 09/10/2012 0359   CREATININE 0.75 09/10/2012 0359   K 4.3 09/10/2012 0359   NA 136 09/10/2012 0359   TSH 1.81 12/15/2006 1333       Component Value Date/Time   WBC 5.9 09/10/2012 0359   RBC 3.49* 09/10/2012 0359   HGB 11.1* 09/10/2012 0359   HCT 32.6* 09/10/2012 0359   PLT 311 09/10/2012 0359   MCV 93.4 09/10/2012 0359      Review of Systems   Constitutional: Negative for fever and malaise/fatigue.  Respiratory: Negative for cough and shortness of breath.   Cardiovascular: Negative for chest pain, palpitations and leg swelling.  Gastrointestinal: Negative for heartburn, abdominal pain and constipation.  Musculoskeletal: Negative for myalgias and back pain.  Skin:       Has open area on left elbow and sore on sacrum  Psychiatric/Behavioral: Negative for depression. The patient does have insomnia.      Physical Exam  Constitutional: He is oriented to person, place, and time.  thin  Neck: Neck supple.  Cardiovascular: Normal rate, regular rhythm and intact distal pulses.   Respiratory: Effort normal and breath sounds normal.  GI: Soft. Bowel sounds are normal.  Musculoskeletal: Normal range of motion.  Neurological: He is alert and oriented to person, place, and time.  Skin: Skin is warm and dry.  Left elbow wound: wound bed is pink there is hyper growth present no signs of infection present. Tolerated silver nitrate without difficulty.  Sacral wound: 2 small areas present wound bed red treated with duoderm   Psychiatric: He has a normal mood and affect.     ASSESSMENT/ PLAN:  For his left elbow wound will keep wound covered with telfa dressing and will continue to monitor his status For his sacral wounds will use santyl as his wound has not changed status.  Will begin ambien 5 mg nightly as needed for sleep and iwll continue to monitor his status

## 2012-10-31 ENCOUNTER — Encounter: Payer: Self-pay | Admitting: Adult Health

## 2012-10-31 ENCOUNTER — Encounter (HOSPITAL_BASED_OUTPATIENT_CLINIC_OR_DEPARTMENT_OTHER): Payer: Medicaid Other | Attending: General Surgery

## 2012-10-31 ENCOUNTER — Non-Acute Institutional Stay (SKILLED_NURSING_FACILITY): Payer: Medicaid Other | Admitting: Adult Health

## 2012-10-31 DIAGNOSIS — F329 Major depressive disorder, single episode, unspecified: Secondary | ICD-10-CM | POA: Insufficient documentation

## 2012-10-31 DIAGNOSIS — Z79899 Other long term (current) drug therapy: Secondary | ICD-10-CM | POA: Insufficient documentation

## 2012-10-31 DIAGNOSIS — I10 Essential (primary) hypertension: Secondary | ICD-10-CM

## 2012-10-31 DIAGNOSIS — L8992 Pressure ulcer of unspecified site, stage 2: Secondary | ICD-10-CM | POA: Insufficient documentation

## 2012-10-31 DIAGNOSIS — M71122 Other infective bursitis, left elbow: Secondary | ICD-10-CM

## 2012-10-31 DIAGNOSIS — T8189XA Other complications of procedures, not elsewhere classified, initial encounter: Secondary | ICD-10-CM | POA: Insufficient documentation

## 2012-10-31 DIAGNOSIS — K219 Gastro-esophageal reflux disease without esophagitis: Secondary | ICD-10-CM | POA: Insufficient documentation

## 2012-10-31 DIAGNOSIS — K5900 Constipation, unspecified: Secondary | ICD-10-CM

## 2012-10-31 DIAGNOSIS — Z8719 Personal history of other diseases of the digestive system: Secondary | ICD-10-CM | POA: Insufficient documentation

## 2012-10-31 DIAGNOSIS — G2581 Restless legs syndrome: Secondary | ICD-10-CM

## 2012-10-31 DIAGNOSIS — K5909 Other constipation: Secondary | ICD-10-CM

## 2012-10-31 DIAGNOSIS — IMO0002 Reserved for concepts with insufficient information to code with codable children: Secondary | ICD-10-CM | POA: Insufficient documentation

## 2012-10-31 DIAGNOSIS — L89309 Pressure ulcer of unspecified buttock, unspecified stage: Secondary | ICD-10-CM | POA: Insufficient documentation

## 2012-10-31 DIAGNOSIS — G4700 Insomnia, unspecified: Secondary | ICD-10-CM

## 2012-10-31 DIAGNOSIS — A499 Bacterial infection, unspecified: Secondary | ICD-10-CM

## 2012-10-31 DIAGNOSIS — M7020 Olecranon bursitis, unspecified elbow: Secondary | ICD-10-CM

## 2012-10-31 DIAGNOSIS — Y838 Other surgical procedures as the cause of abnormal reaction of the patient, or of later complication, without mention of misadventure at the time of the procedure: Secondary | ICD-10-CM | POA: Insufficient documentation

## 2012-10-31 NOTE — Assessment & Plan Note (Signed)
Will continue prilosec 20 mg daily  

## 2012-10-31 NOTE — Assessment & Plan Note (Signed)
Will colace and senna will monitor status
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
## 2012-10-31 NOTE — Assessment & Plan Note (Signed)
Will continue zoloft 50 mg daily will continue to be followed by psych services.

## 2012-10-31 NOTE — Assessment & Plan Note (Addendum)
Will continue lisinopril 10 mg daily and will continue to monitor his status

## 2012-10-31 NOTE — Assessment & Plan Note (Signed)
Will continue current treatment and will continue to monitor his status will be followed by wound center.

## 2012-10-31 NOTE — Assessment & Plan Note (Signed)
Will continue melatonin 3 mg nightly and trazodone 50 mg nightly

## 2012-10-31 NOTE — Assessment & Plan Note (Signed)
Will continue requip 0.25 mg nightly magox 400 mg nightly and tylenol 500 mg nightly
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
## 2012-11-07 ENCOUNTER — Non-Acute Institutional Stay (SKILLED_NURSING_FACILITY): Payer: Medicaid Other | Admitting: Adult Health

## 2012-11-07 ENCOUNTER — Encounter: Payer: Self-pay | Admitting: Adult Health

## 2012-11-07 DIAGNOSIS — G4700 Insomnia, unspecified: Secondary | ICD-10-CM

## 2012-11-07 MED ORDER — MELATONIN 3 MG PO CAPS: 2.0000 | ORAL_CAPSULE | Freq: Every day | ORAL | Status: DC

## 2012-11-07 MED ORDER — TRAZODONE HCL 50 MG PO TABS: 75.0000 mg | ORAL_TABLET | Freq: Every day | ORAL | Status: DC
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
## 2012-11-07 NOTE — Assessment & Plan Note (Signed)
As his insomnia is worse; will increase both is melatonin to 6 mg nightly and his trazodone to 75 mg nightly and will monitor his status

## 2012-11-28 ENCOUNTER — Encounter (HOSPITAL_BASED_OUTPATIENT_CLINIC_OR_DEPARTMENT_OTHER): Payer: Medicaid Other

## 2013-01-09 ENCOUNTER — Non-Acute Institutional Stay (SKILLED_NURSING_FACILITY): Payer: Medicaid Other | Admitting: Adult Health

## 2013-01-09 DIAGNOSIS — B9689 Other specified bacterial agents as the cause of diseases classified elsewhere: Secondary | ICD-10-CM

## 2013-01-09 DIAGNOSIS — K219 Gastro-esophageal reflux disease without esophagitis: Secondary | ICD-10-CM

## 2013-01-09 DIAGNOSIS — G2581 Restless legs syndrome: Secondary | ICD-10-CM

## 2013-01-09 DIAGNOSIS — K5900 Constipation, unspecified: Secondary | ICD-10-CM

## 2013-01-09 DIAGNOSIS — F32A Depression, unspecified: Secondary | ICD-10-CM

## 2013-01-09 DIAGNOSIS — K5909 Other constipation: Secondary | ICD-10-CM

## 2013-01-09 DIAGNOSIS — A499 Bacterial infection, unspecified: Secondary | ICD-10-CM

## 2013-01-09 DIAGNOSIS — F3289 Other specified depressive episodes: Secondary | ICD-10-CM

## 2013-01-09 DIAGNOSIS — M7020 Olecranon bursitis, unspecified elbow: Secondary | ICD-10-CM

## 2013-01-09 DIAGNOSIS — I10 Essential (primary) hypertension: Secondary | ICD-10-CM

## 2013-01-09 DIAGNOSIS — G4700 Insomnia, unspecified: Secondary | ICD-10-CM

## 2013-01-09 DIAGNOSIS — F329 Major depressive disorder, single episode, unspecified: Secondary | ICD-10-CM

## 2013-01-09 DIAGNOSIS — M71122 Other infective bursitis, left elbow: Secondary | ICD-10-CM

## 2013-01-17 ENCOUNTER — Encounter (HOSPITAL_COMMUNITY): Payer: Self-pay | Admitting: *Deleted

## 2013-01-17 ENCOUNTER — Emergency Department (HOSPITAL_COMMUNITY)
Admission: EM | Admit: 2013-01-17 | Discharge: 2013-01-17 | Disposition: A | Discharge status: Skilled Nursing Facility | Payer: Medicaid Other | Attending: Emergency Medicine | Admitting: Emergency Medicine

## 2013-01-17 DIAGNOSIS — Y9389 Activity, other specified: Secondary | ICD-10-CM | POA: Insufficient documentation

## 2013-01-17 DIAGNOSIS — I10 Essential (primary) hypertension: Secondary | ICD-10-CM | POA: Insufficient documentation

## 2013-01-17 DIAGNOSIS — Z87448 Personal history of other diseases of urinary system: Secondary | ICD-10-CM | POA: Insufficient documentation

## 2013-01-17 DIAGNOSIS — S0990XA Unspecified injury of head, initial encounter: Secondary | ICD-10-CM | POA: Insufficient documentation

## 2013-01-17 DIAGNOSIS — S81009A Unspecified open wound, unspecified knee, initial encounter: Secondary | ICD-10-CM | POA: Insufficient documentation

## 2013-01-17 DIAGNOSIS — Y9210 Unspecified residential institution as the place of occurrence of the external cause: Secondary | ICD-10-CM | POA: Insufficient documentation

## 2013-01-17 DIAGNOSIS — Z79899 Other long term (current) drug therapy: Secondary | ICD-10-CM | POA: Insufficient documentation

## 2013-01-17 DIAGNOSIS — Z8709 Personal history of other diseases of the respiratory system: Secondary | ICD-10-CM | POA: Insufficient documentation

## 2013-01-17 DIAGNOSIS — M6281 Muscle weakness (generalized): Secondary | ICD-10-CM | POA: Insufficient documentation

## 2013-01-17 DIAGNOSIS — K219 Gastro-esophageal reflux disease without esophagitis: Secondary | ICD-10-CM | POA: Insufficient documentation

## 2013-01-17 DIAGNOSIS — Z96659 Presence of unspecified artificial knee joint: Secondary | ICD-10-CM | POA: Insufficient documentation

## 2013-01-17 DIAGNOSIS — S81811A Laceration without foreign body, right lower leg, initial encounter: Secondary | ICD-10-CM

## 2013-01-17 DIAGNOSIS — Z8719 Personal history of other diseases of the digestive system: Secondary | ICD-10-CM | POA: Insufficient documentation

## 2013-01-17 DIAGNOSIS — Z9889 Other specified postprocedural states: Secondary | ICD-10-CM | POA: Insufficient documentation

## 2013-01-17 DIAGNOSIS — Z87442 Personal history of urinary calculi: Secondary | ICD-10-CM | POA: Insufficient documentation

## 2013-01-17 DIAGNOSIS — W050XXA Fall from non-moving wheelchair, initial encounter: Secondary | ICD-10-CM | POA: Insufficient documentation

## 2013-01-17 DIAGNOSIS — Z8659 Personal history of other mental and behavioral disorders: Secondary | ICD-10-CM | POA: Insufficient documentation

## 2013-01-17 DIAGNOSIS — Z87891 Personal history of nicotine dependence: Secondary | ICD-10-CM | POA: Insufficient documentation

## 2013-01-17 DIAGNOSIS — Z8614 Personal history of Methicillin resistant Staphylococcus aureus infection: Secondary | ICD-10-CM | POA: Insufficient documentation

## 2013-01-17 MED ORDER — LIDOCAINE HCL 1 % IJ SOLN: 30.0000 mL | Freq: Once | INTRAMUSCULAR | Status: DC

## 2013-01-17 MED ORDER — ACETAMINOPHEN 325 MG PO TABS
650.0000 mg | ORAL_TABLET | Freq: Once | ORAL | Status: AC
  Administered 2013-01-17: 650 mg via ORAL
  Filled 2013-01-17: qty 2

## 2013-01-17 MED ORDER — HYDROCODONE-ACETAMINOPHEN 5-325 MG PO TABS
1.0000 | ORAL_TABLET | Freq: Once | ORAL | Status: AC
  Administered 2013-01-17: 1 via ORAL
  Filled 2013-01-17: qty 1

## 2013-01-17 NOTE — ED Provider Notes (Signed)
History    CSN: [id number] Arrival date & time 01/17/13  1349  First MD Initiated Contact with Patient 01/17/13 1522     Chief Complaint  Patient presents with  . Extremity Laceration    right   (Consider location/radiation/quality/duration/timing/severity/associated sxs/prior Treatment) HPI Comments: 62 yo male with MS, htn, gerd, wheel chair bound presents with right leg laceration.    The history is provided by the patient.   Past Medical History  Diagnosis Date  . Hypertension   . Duodenal ulcer hemorrhage   . Hypertrophy of prostate   . Generalized muscle weakness   . Acute respiratory failure   . Cognitive communication deficit   . MRSA (methicillin resistant Staphylococcus aureus)   . Kidney stone    Past Surgical History  Procedure Laterality Date  . Knee arthroscopy      right  . Total knee arthroplasty      right  . Wrist surgery      right  . Neck surgery    . Exploratory laparotomy  02/2011    Duodenotomy, oversew of gastric ulcer/duodenal artery  . Appendectomy    . Tibia fracture surgery    . Olecranon bursectomy Left 09/08/2012    Procedure: OLECRANON BURSA;  Surgeon: Velna Ochs, MD;  Location: [facility];  Service: Orthopedics;  Laterality: Left;  LEFT  ELBOW  OLECRANON  BURSA  INCISION   Family History  Problem Relation Age of Onset  . Hypertension Father    History  Substance Use Topics  . Smoking status: Former Smoker -- 40 years    Quit date: 03/08/2011  . Smokeless tobacco: Never Used  . Alcohol Use: No     Comment: states he stopped 2 yrs ago    Review of Systems  Constitutional: Negative for fever.  Eyes: Negative for visual disturbance.  Respiratory: Negative for shortness of breath.   Cardiovascular: Negative for chest pain.  Skin: Positive for wound.  Neurological: Positive for weakness (chronic) and headaches (after injury, right sided, similar previous).    Allergies  Aspirin and Ibuprofen  Home Medications    Current Outpatient Rx  Name  Route  Sig  Dispense  Refill  . acetaminophen (TYLENOL) 325 MG tablet   Oral   Take 650 mg by mouth every 6 (six) hours as needed for pain.         Marland Kitchen acetaminophen (TYLENOL) 500 MG tablet   Oral   Take 1,000 mg by mouth at bedtime. For pain         . docusate sodium (COLACE) 100 MG capsule   Oral   Take 100 mg by mouth at bedtime.          Marland Kitchen HYDROcodone-acetaminophen (NORCO/VICODIN) 5-325 MG per tablet      Take one tablet every 6 hours as needed for pain   120 tablet   5   . lactose free nutrition (BOOST PLUS) LIQD   Oral   Take 237 mLs by mouth daily at 12 noon.         Marland Kitchen lisinopril (PRINIVIL,ZESTRIL) 10 MG tablet   Oral   Take 10 mg by mouth every morning.          . loratadine (CLARITIN) 10 MG tablet   Oral   Take 10 mg by mouth daily as needed for allergies.         . magnesium oxide (MAG-OX) 400 MG tablet   Oral   Take 400 mg by mouth at bedtime.          Marland Kitchen  Melatonin 3 MG CAPS   Oral   Take 2 capsules (6 mg total) by mouth at bedtime.   60 capsule   99   . Multiple Vitamin (MULTIVITAMIN WITH MINERALS) TABS   Oral   Take 2 tablets by mouth every morning.          Marland Kitchen omeprazole (PRILOSEC) 20 MG capsule   Oral   Take 20 mg by mouth every morning.          Marland Kitchen rOPINIRole (REQUIP) 0.25 MG tablet   Oral   Take 0.25 mg by mouth at bedtime.         . senna (SENOKOT) 8.6 MG TABS   Oral   Take 1 tablet by mouth at bedtime.          . sertraline (ZOLOFT) 50 MG tablet   Oral   Take 50 mg by mouth at bedtime.          . traZODone (DESYREL) 50 MG tablet   Oral   Take 125 mg by mouth at bedtime.          BP 131/67  Pulse 78  Temp(Src) 98.7 F (37.1 C) (Oral)  Resp 18  SpO2 92% Physical Exam  Nursing note and vitals reviewed. Constitutional: He is oriented to person, place, and time. He appears well-developed and well-nourished. No distress.  HENT:  Head: Normocephalic and atraumatic.  Neck:  Normal range of motion. Neck supple.  Cardiovascular: Normal rate.   Pulmonary/Chest: Effort normal.  Abdominal: Soft.  Musculoskeletal: He exhibits no edema.  Neurological: He is alert and oriented to person, place, and time.  Skin: Skin is warm.  Right anterior lower tibia 4 cm lac linear, superficial, no bone visualized    ED Course  Procedures (including critical care time) LACERATION REPAIR Performed by: Enid Skeens Authorized by: Enid Skeens Consent: Verbal consent obtained. Risks and benefits: risks, benefits and alternatives were discussed Consent given by: patient Patient identity confirmed: provided demographic data Prepped and Draped in normal sterile fashion Wound explored  Laceration Location: right LE  Laceration Length: 4cm  No Foreign Bodies seen or palpated  Anesthesia: local infiltration  Local anesthetic: lidocaine 1%  Anesthetic total: 5 ml  Irrigation method: syringe Amount of cleaning: standard  Skin closure: ethilon 3-0  Number of sutures: 4  Technique: interupted  Patient tolerance: Patient tolerated the procedure well with no immediate complications.   Labs Reviewed - No data to display No results found. 1. Leg laceration, right, initial encounter     MDM  Pt refuses xray, no focal bone pain, he understands I cannot rule out fracture Wound cleaned and lac repaired.   Fup discussed   Enid Skeens, MD 01/17/13 1655

## 2013-01-17 NOTE — ED Notes (Signed)
[facility] called for transportation back to [facility] 
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
## 2013-02-21 ENCOUNTER — Non-Acute Institutional Stay (SKILLED_NURSING_FACILITY): Payer: Medicaid Other | Admitting: Internal Medicine

## 2013-02-21 DIAGNOSIS — M71022 Abscess of bursa, left elbow: Secondary | ICD-10-CM

## 2013-02-21 DIAGNOSIS — K5900 Constipation, unspecified: Secondary | ICD-10-CM

## 2013-02-21 DIAGNOSIS — G35 Multiple sclerosis: Secondary | ICD-10-CM

## 2013-02-21 DIAGNOSIS — G2581 Restless legs syndrome: Secondary | ICD-10-CM

## 2013-02-21 DIAGNOSIS — M7022 Olecranon bursitis, left elbow: Secondary | ICD-10-CM

## 2013-02-21 DIAGNOSIS — K5909 Other constipation: Secondary | ICD-10-CM

## 2013-02-21 DIAGNOSIS — M7020 Olecranon bursitis, unspecified elbow: Secondary | ICD-10-CM

## 2013-02-21 DIAGNOSIS — G4700 Insomnia, unspecified: Secondary | ICD-10-CM

## 2013-02-21 DIAGNOSIS — IMO0002 Reserved for concepts with insufficient information to code with codable children: Secondary | ICD-10-CM
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
## 2013-02-26 ENCOUNTER — Encounter: Payer: Self-pay | Admitting: Internal Medicine

## 2013-03-12 ENCOUNTER — Telehealth: Payer: Self-pay | Admitting: Cardiovascular Disease

## 2013-04-01 IMAGING — CR DG CHEST 1V PORT
1 series · 1 of 1 positions shown · non-contrast
Comparison: the previous day's study

CLINICAL DATA: Intubation

PORTABLE CHEST - 1 VIEW

[view not recorded]
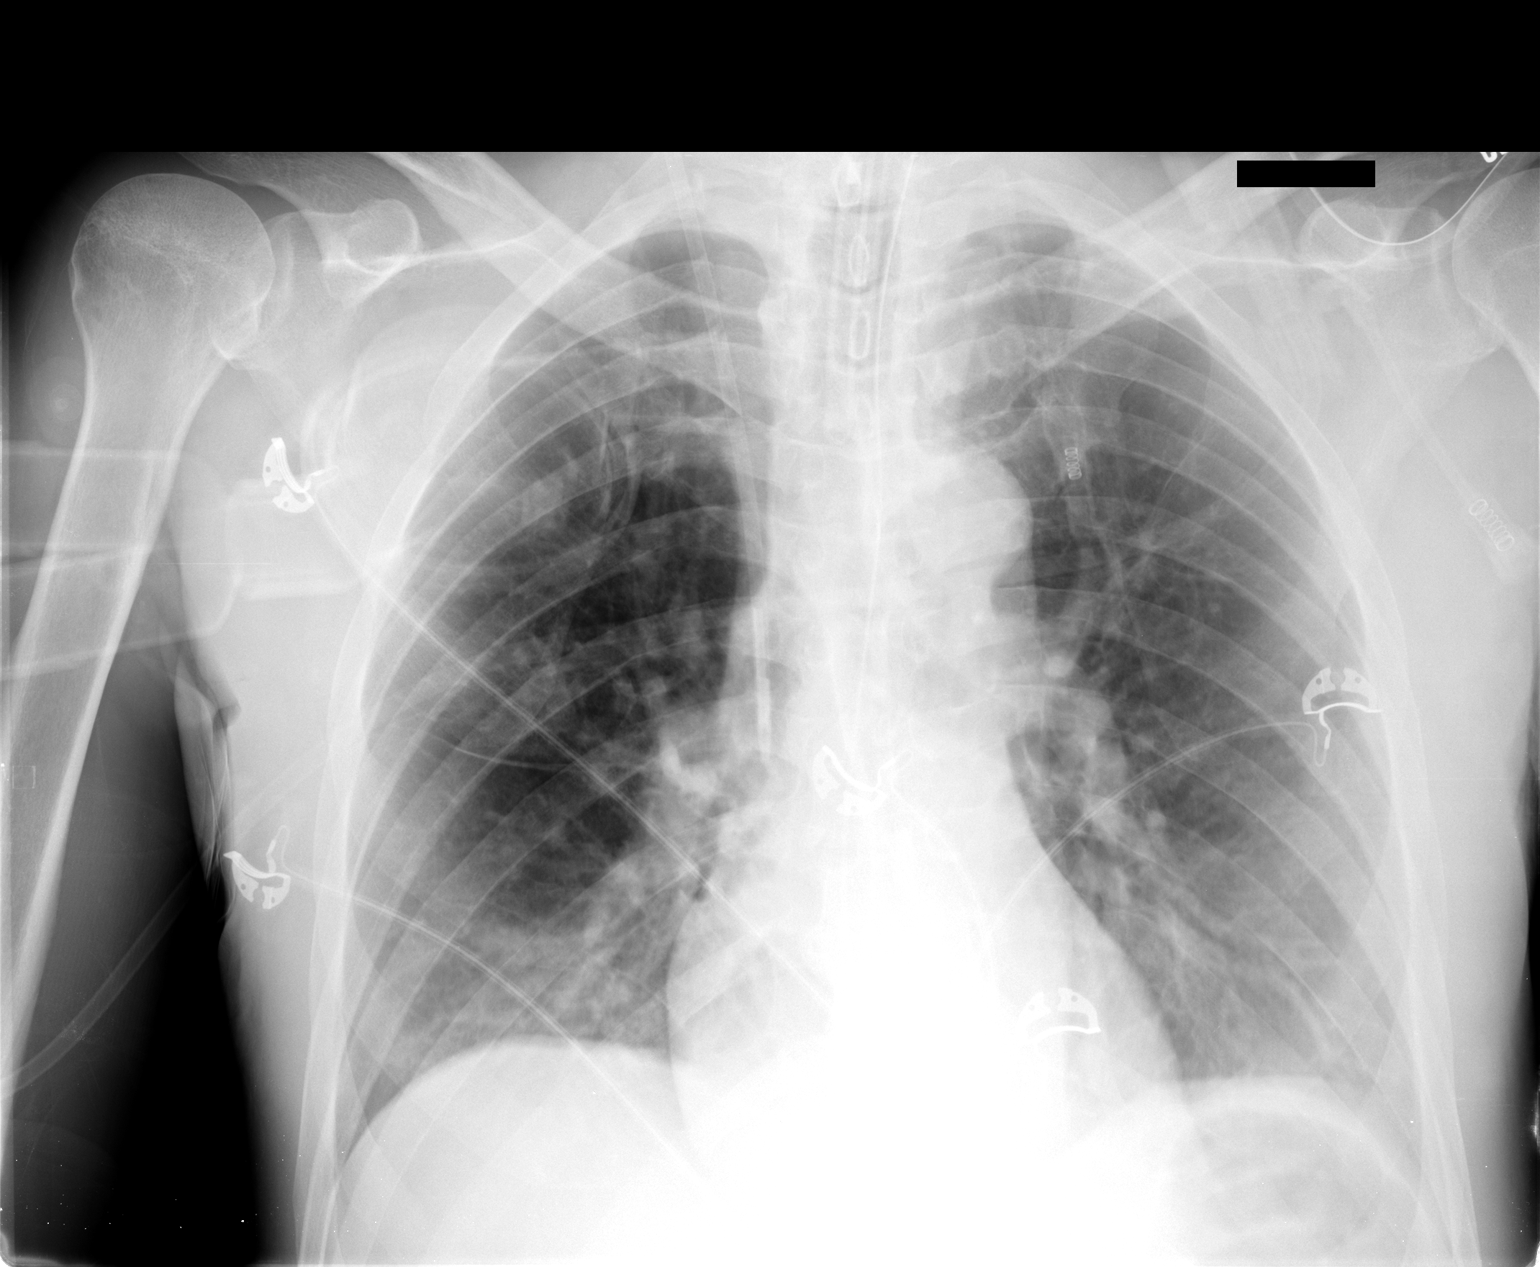

[1 of 1 positions shown; findings below may reference images not displayed]

FINDINGS: Endotracheal tube stable position.  A nasogastric tube
been passed at least as far as the stomach, tip not seen.  Right IJ
central line stable.  There is some mild patchy subsegmental
atelectasis in the lung bases.  No confluent infiltrate or overt
edema.  Heart size normal.  No definite effusion.  No pneumothorax.
IMPRESSION: 1.  Nasogastric tube placement to the stomach.
2.  Mild bibasilar atelectasis.
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
## 2013-06-25 ENCOUNTER — Encounter (HOSPITAL_COMMUNITY): Payer: Self-pay | Admitting: Emergency Medicine

## 2013-06-25 ENCOUNTER — Emergency Department (HOSPITAL_COMMUNITY): Payer: Medicaid Other

## 2013-06-25 ENCOUNTER — Emergency Department (HOSPITAL_COMMUNITY)
Admission: EM | Admit: 2013-06-25 | Discharge: 2013-06-25 | Disposition: A | Discharge status: Home / Self Care | Payer: Medicaid Other | Attending: Emergency Medicine | Admitting: Emergency Medicine

## 2013-06-25 DIAGNOSIS — W010XXA Fall on same level from slipping, tripping and stumbling without subsequent striking against object, initial encounter: Secondary | ICD-10-CM | POA: Insufficient documentation

## 2013-06-25 DIAGNOSIS — S79919A Unspecified injury of unspecified hip, initial encounter: Secondary | ICD-10-CM | POA: Insufficient documentation

## 2013-06-25 DIAGNOSIS — Z8659 Personal history of other mental and behavioral disorders: Secondary | ICD-10-CM | POA: Insufficient documentation

## 2013-06-25 DIAGNOSIS — Z79899 Other long term (current) drug therapy: Secondary | ICD-10-CM | POA: Insufficient documentation

## 2013-06-25 DIAGNOSIS — Z96659 Presence of unspecified artificial knee joint: Secondary | ICD-10-CM | POA: Insufficient documentation

## 2013-06-25 DIAGNOSIS — Z993 Dependence on wheelchair: Secondary | ICD-10-CM | POA: Insufficient documentation

## 2013-06-25 DIAGNOSIS — L89159 Pressure ulcer of sacral region, unspecified stage: Secondary | ICD-10-CM

## 2013-06-25 DIAGNOSIS — Y9210 Unspecified residential institution as the place of occurrence of the external cause: Secondary | ICD-10-CM | POA: Insufficient documentation

## 2013-06-25 DIAGNOSIS — Z87442 Personal history of urinary calculi: Secondary | ICD-10-CM | POA: Insufficient documentation

## 2013-06-25 DIAGNOSIS — Y9389 Activity, other specified: Secondary | ICD-10-CM | POA: Insufficient documentation

## 2013-06-25 DIAGNOSIS — L98499 Non-pressure chronic ulcer of skin of other sites with unspecified severity: Secondary | ICD-10-CM | POA: Insufficient documentation

## 2013-06-25 DIAGNOSIS — Z8614 Personal history of Methicillin resistant Staphylococcus aureus infection: Secondary | ICD-10-CM | POA: Insufficient documentation

## 2013-06-25 DIAGNOSIS — I10 Essential (primary) hypertension: Secondary | ICD-10-CM | POA: Insufficient documentation

## 2013-06-25 DIAGNOSIS — Z8709 Personal history of other diseases of the respiratory system: Secondary | ICD-10-CM | POA: Insufficient documentation

## 2013-06-25 DIAGNOSIS — Z87448 Personal history of other diseases of urinary system: Secondary | ICD-10-CM | POA: Insufficient documentation

## 2013-06-25 DIAGNOSIS — Z87891 Personal history of nicotine dependence: Secondary | ICD-10-CM | POA: Insufficient documentation

## 2013-06-25 DIAGNOSIS — Z8719 Personal history of other diseases of the digestive system: Secondary | ICD-10-CM | POA: Insufficient documentation

## 2013-06-25 MED ORDER — MUPIROCIN CALCIUM 2 % EX CREA: 1.0000 "application " | TOPICAL_CREAM | Freq: Three times a day (TID) | CUTANEOUS | Status: DC

## 2013-06-25 MED ORDER — ACETAMINOPHEN 325 MG PO TABS
650.0000 mg | ORAL_TABLET | Freq: Once | ORAL | Status: AC
  Administered 2013-06-25: 650 mg via ORAL
  Filled 2013-06-25: qty 2

## 2013-06-25 NOTE — ED Provider Notes (Signed)
CSN: [id number]     Arrival date & time 06/25/13  1036 History   First MD Initiated Contact with Patient 06/25/13 1127     Chief Complaint  Patient presents with  . Tailbone Pain   (Consider location/radiation/quality/duration/timing/severity/associated sxs/prior Treatment) The history is provided by the patient and medical records.   This is a 62 year old male with past medical history significant for hypertension, MRSA, BPH, presenting to the ED from [facility] for sacral pain. Patient has had persistent sacral pain for the past 3 weeks. Patient is wheelchair bound with limited mobility due to "bad knees".  Pt states he did fall within the past week while trying to get into bed.  States he lost his footing and fell onto his right hip.  Denies any head trauma or LOC.  Pt denies any drainage or bleeding from sacral area.  No fevers, sweats, or chills.  States he had similar pain before when he had a "bedsore"-- states he used some type of cream and sx improved, unsure what type of cream it was.  NAD on arrival, VS stable.  Past Medical History  Diagnosis Date  . Hypertension   . Duodenal ulcer hemorrhage   . Hypertrophy of prostate   . Generalized muscle weakness   . Acute respiratory failure   . Cognitive communication deficit   . MRSA (methicillin resistant Staphylococcus aureus)   . Kidney stone    Past Surgical History  Procedure Laterality Date  . Knee arthroscopy      right  . Total knee arthroplasty      right  . Wrist surgery      right  . Neck surgery    . Exploratory laparotomy  02/2011    Duodenotomy, oversew of gastric ulcer/duodenal artery  . Appendectomy    . Tibia fracture surgery    . Olecranon bursectomy Left 09/08/2012    Procedure: OLECRANON BURSA;  Surgeon: Velna Ochs, MD;  Location: [facility];  Service: Orthopedics;  Laterality: Left;  LEFT  ELBOW  OLECRANON  BURSA  INCISION   Family History  Problem Relation Age of Onset  . Hypertension Father     History  Substance Use Topics  . Smoking status: Former Smoker -- 40 years    Quit date: 03/08/2011  . Smokeless tobacco: Never Used  . Alcohol Use: No     Comment: states he stopped 2 yrs ago    Review of Systems  Musculoskeletal: Positive for arthralgias.  All other systems reviewed and are negative.    Allergies  Aspirin and Ibuprofen  Home Medications   Current Outpatient Rx  Name  Route  Sig  Dispense  Refill  . acetaminophen (TYLENOL) 325 MG tablet   Oral   Take 650 mg by mouth every 6 (six) hours as needed for pain.         Marland Kitchen acetaminophen (TYLENOL) 500 MG tablet   Oral   Take 1,000 mg by mouth at bedtime. For pain         . HYDROcodone-acetaminophen (NORCO/VICODIN) 5-325 MG per tablet      Take one tablet every 6 hours as needed for pain   120 tablet   5   . lisinopril (PRINIVIL,ZESTRIL) 10 MG tablet   Oral   Take 10 mg by mouth every morning.          . loratadine (CLARITIN) 10 MG tablet   Oral   Take 10 mg by mouth daily as needed for allergies.         Marland Kitchen  magnesium oxide (MAG-OX) 400 MG tablet   Oral   Take 400 mg by mouth at bedtime.          . Multiple Vitamin (MULTIVITAMIN WITH MINERALS) TABS   Oral   Take 2 tablets by mouth every morning.          Marland Kitchen omeprazole (PRILOSEC) 20 MG capsule   Oral   Take 20 mg by mouth every morning.          Marland Kitchen rOPINIRole (REQUIP) 0.25 MG tablet   Oral   Take 0.25 mg by mouth at bedtime.         . senna (SENOKOT) 8.6 MG TABS   Oral   Take 1 tablet by mouth at bedtime.          . sertraline (ZOLOFT) 50 MG tablet   Oral   Take 50 mg by mouth at bedtime.          . traZODone (DESYREL) 50 MG tablet   Oral   Take 125 mg by mouth at bedtime.         . mupirocin cream (BACTROBAN) 2 %   Topical   Apply 1 application topically 3 (three) times daily.   30 g   0    BP 121/65  Pulse 69  Temp(Src) 98.2 F (36.8 C) (Oral)  Resp 16  SpO2 97%  Physical Exam  Nursing note and  vitals reviewed. Constitutional: He is oriented to person, place, and time. He appears well-developed and well-nourished. No distress.  HENT:  Head: Normocephalic and atraumatic.  Mouth/Throat: Oropharynx is clear and moist.  Eyes: Conjunctivae and EOM are normal. Pupils are equal, round, and reactive to light.  Neck: Normal range of motion. Neck supple.  Cardiovascular: Normal rate, regular rhythm and normal heart sounds.   Pulmonary/Chest: Effort normal and breath sounds normal. No respiratory distress. He has no wheezes.  Abdominal: Soft. Bowel sounds are normal. There is no tenderness. There is no guarding.  Musculoskeletal: Normal range of motion. He exhibits no edema.  Small skin tear to right posterior hip; some TTP without bruising, swelling, or noted deformity; limited ROM at baseline; distal sensation intact; gait not tested  Neurological: He is alert and oriented to person, place, and time.  Skin: Skin is warm, dry and intact. He is not diaphoretic.  Stage II sacral decubitus ulcer present; there is TTP without noted erythema, induration, or drainage  Psychiatric: He has a normal mood and affect.    ED Course  Procedures (including critical care time) Labs Review Labs Reviewed - No data to display Imaging Review Dg Sacrum/coccyx  06/25/2013   CLINICAL DATA:  Right hip pain.  Low back pain.  EXAM: SACRUM AND COCCYX - 2+ VIEW  COMPARISON:  08/18/2012  FINDINGS: Axial loss of articular space noted in both hips will with associated mild spurring. Lower lumbar spondylosis. Arcuate lines in the sacrum appear grossly intact. Loss of intervertebral disc height noted particularly at L5-S1.  The patient is moderately oblique to on the lateral view attempt.  IMPRESSION: 1. No acute findings are observed. Mildly reduced sensitivity due to obliquity on the lateral projection, which was the best be obtainable. 2. Lower lumbar spondylosis and degenerative disc disease. 3. Axial loss of articular  space in both hips.   Electronically Signed   By: Herbie Baltimore M.D.   On: 06/25/2013 12:56   Dg Hip Complete Right  06/25/2013   CLINICAL DATA:  Severe right hip pain.  EXAM: RIGHT  HIP - COMPLETE 2+ VIEW  COMPARISON:  Radiograph dated 07/16/2006  FINDINGS: There is progressive osteoarthritis of the right hip joint with joint space narrowing, osteophyte formation on the femoral head and acetabulum and subtle subchondral cyst formation in the acetabulum. There are similar findings in the left hip. There is also fairly severe facet arthritis in the lower lumbar spine, more to the right than the left.  IMPRESSION: Progressive osteoarthritis of the right hip. No acute osseous abnormality.   Electronically Signed   By: Geanie Cooley M.D.   On: 06/25/2013 13:00    EKG Interpretation   None       MDM   1. Sacral decubitus ulcer    X-rays negative for acute fracture.  Patient with stage II decubitus ulcer sacral area without superimposed infection. Pt afebrile, non-toxic appearing, NAD, VS stable- ok for discharge.  Patient has a history of MRSA will be started on Bactroban cream 3 times a day. Instructed to change position frequently throughout the day to prevent worsening of ulcer. Followup with primary care physician if symptoms worsen. Discussed plan with patient, he agreed. Return precautions advised.  Garlon Hatchet, PA-C 06/25/13 1517

## 2013-06-25 NOTE — ED Notes (Signed)
Pt waiting for [facility] transport. 

## 2013-06-25 NOTE — ED Notes (Addendum)
Per EMS, Pt, from [facility], c/o sacral pain x 3 weeks.  Pain score 10/10.  Pt is wheelchair bound due to "breaking knees."  Pt sts he was seen for same complaint "a year ago and they fixed it with cream."

## 2013-07-09 NOTE — ED Provider Notes (Signed)
Medical screening examination/treatment/procedure(s) were performed by non-physician practitioner and as supervising physician I was immediately available for consultation/collaboration.  EKG Interpretation   None         Layla MawKristen N Ethon Wymer, DO 07/09/13 [id number]

## 2013-07-16 ENCOUNTER — Encounter (HOSPITAL_BASED_OUTPATIENT_CLINIC_OR_DEPARTMENT_OTHER): Payer: Medicaid Other | Attending: General Surgery

## 2013-07-16 DIAGNOSIS — L89109 Pressure ulcer of unspecified part of back, unspecified stage: Secondary | ICD-10-CM | POA: Insufficient documentation

## 2013-07-16 DIAGNOSIS — L8992 Pressure ulcer of unspecified site, stage 2: Secondary | ICD-10-CM | POA: Insufficient documentation

## 2013-07-16 DIAGNOSIS — L89309 Pressure ulcer of unspecified buttock, unspecified stage: Secondary | ICD-10-CM | POA: Insufficient documentation

## 2013-08-01 ENCOUNTER — Encounter (HOSPITAL_BASED_OUTPATIENT_CLINIC_OR_DEPARTMENT_OTHER): Payer: Medicaid Other | Attending: General Surgery

## 2013-08-01 DIAGNOSIS — L89109 Pressure ulcer of unspecified part of back, unspecified stage: Secondary | ICD-10-CM | POA: Diagnosis not present

## 2013-08-01 DIAGNOSIS — L8993 Pressure ulcer of unspecified site, stage 3: Secondary | ICD-10-CM | POA: Insufficient documentation

## 2013-08-08 DIAGNOSIS — L8993 Pressure ulcer of unspecified site, stage 3: Secondary | ICD-10-CM | POA: Diagnosis not present

## 2013-08-08 DIAGNOSIS — L89109 Pressure ulcer of unspecified part of back, unspecified stage: Secondary | ICD-10-CM | POA: Diagnosis not present

## 2013-08-22 DIAGNOSIS — L89109 Pressure ulcer of unspecified part of back, unspecified stage: Secondary | ICD-10-CM | POA: Diagnosis not present

## 2013-08-22 DIAGNOSIS — L8993 Pressure ulcer of unspecified site, stage 3: Secondary | ICD-10-CM | POA: Diagnosis not present

## 2013-08-29 ENCOUNTER — Encounter (HOSPITAL_BASED_OUTPATIENT_CLINIC_OR_DEPARTMENT_OTHER): Payer: Medicaid Other | Attending: General Surgery

## 2013-08-29 DIAGNOSIS — L89109 Pressure ulcer of unspecified part of back, unspecified stage: Secondary | ICD-10-CM | POA: Insufficient documentation

## 2013-08-29 DIAGNOSIS — L8992 Pressure ulcer of unspecified site, stage 2: Secondary | ICD-10-CM | POA: Insufficient documentation

## 2013-09-08 ENCOUNTER — Emergency Department (HOSPITAL_COMMUNITY): Payer: Medicaid Other

## 2013-09-08 ENCOUNTER — Emergency Department (HOSPITAL_COMMUNITY)
Admission: EM | Admit: 2013-09-08 | Discharge: 2013-09-08 | Disposition: A | Discharge status: Home / Self Care | Payer: Medicaid Other | Attending: Emergency Medicine | Admitting: Emergency Medicine

## 2013-09-08 ENCOUNTER — Encounter (HOSPITAL_COMMUNITY): Payer: Self-pay | Admitting: Emergency Medicine

## 2013-09-08 DIAGNOSIS — L89109 Pressure ulcer of unspecified part of back, unspecified stage: Secondary | ICD-10-CM | POA: Insufficient documentation

## 2013-09-08 DIAGNOSIS — Z79899 Other long term (current) drug therapy: Secondary | ICD-10-CM | POA: Insufficient documentation

## 2013-09-08 DIAGNOSIS — I10 Essential (primary) hypertension: Secondary | ICD-10-CM | POA: Insufficient documentation

## 2013-09-08 DIAGNOSIS — R509 Fever, unspecified: Secondary | ICD-10-CM | POA: Insufficient documentation

## 2013-09-08 DIAGNOSIS — R112 Nausea with vomiting, unspecified: Secondary | ICD-10-CM | POA: Insufficient documentation

## 2013-09-08 DIAGNOSIS — R41841 Cognitive communication deficit: Secondary | ICD-10-CM | POA: Insufficient documentation

## 2013-09-08 DIAGNOSIS — N509 Disorder of male genital organs, unspecified: Secondary | ICD-10-CM | POA: Insufficient documentation

## 2013-09-08 DIAGNOSIS — R109 Unspecified abdominal pain: Secondary | ICD-10-CM

## 2013-09-08 DIAGNOSIS — L8995 Pressure ulcer of unspecified site, unstageable: Secondary | ICD-10-CM | POA: Insufficient documentation

## 2013-09-08 DIAGNOSIS — R1084 Generalized abdominal pain: Secondary | ICD-10-CM | POA: Insufficient documentation

## 2013-09-08 DIAGNOSIS — R197 Diarrhea, unspecified: Secondary | ICD-10-CM | POA: Insufficient documentation

## 2013-09-08 DIAGNOSIS — N400 Enlarged prostate without lower urinary tract symptoms: Secondary | ICD-10-CM | POA: Insufficient documentation

## 2013-09-08 DIAGNOSIS — Z87891 Personal history of nicotine dependence: Secondary | ICD-10-CM | POA: Insufficient documentation

## 2013-09-08 LAB — CBC WITH DIFFERENTIAL/PLATELET
BASOS PCT: 1 % (ref 0–1)
Basophils Absolute: 0 10*3/uL (ref 0.0–0.1)
EOS ABS: 0 10*3/uL (ref 0.0–0.7)
EOS PCT: 1 % (ref 0–5)
HEMATOCRIT: 38.4 % — AB (ref 39.0–52.0)
HEMOGLOBIN: 13.6 g/dL (ref 13.0–17.0)
LYMPHS ABS: 1 10*3/uL (ref 0.7–4.0)
Lymphocytes Relative: 26 % (ref 12–46)
MCH: 33.3 pg (ref 26.0–34.0)
MCHC: 35.4 g/dL (ref 30.0–36.0)
MCV: 93.9 fL (ref 78.0–100.0)
MONO ABS: 0.4 10*3/uL (ref 0.1–1.0)
MONOS PCT: 10 % (ref 3–12)
NEUTROS PCT: 63 % (ref 43–77)
Neutro Abs: 2.5 10*3/uL (ref 1.7–7.7)
Platelets: 222 10*3/uL (ref 150–400)
RBC: 4.09 MIL/uL — AB (ref 4.22–5.81)
RDW: 12.4 % (ref 11.5–15.5)
WBC: 3.9 10*3/uL — ABNORMAL LOW (ref 4.0–10.5)

## 2013-09-08 LAB — URINALYSIS, ROUTINE W REFLEX MICROSCOPIC
BILIRUBIN URINE: NEGATIVE
Glucose, UA: NEGATIVE mg/dL
Hgb urine dipstick: NEGATIVE
Ketones, ur: NEGATIVE mg/dL
LEUKOCYTES UA: NEGATIVE
NITRITE: NEGATIVE
PH: 8 (ref 5.0–8.0)
Protein, ur: NEGATIVE mg/dL
SPECIFIC GRAVITY, URINE: 1.016 (ref 1.005–1.030)
UROBILINOGEN UA: 2 mg/dL — AB (ref 0.0–1.0)

## 2013-09-08 LAB — COMPREHENSIVE METABOLIC PANEL
ALBUMIN: 3.6 g/dL (ref 3.5–5.2)
ALK PHOS: 97 U/L (ref 39–117)
ALT: 69 U/L — ABNORMAL HIGH (ref 0–53)
AST: 69 U/L — ABNORMAL HIGH (ref 0–37)
BUN: 16 mg/dL (ref 6–23)
CALCIUM: 9.4 mg/dL (ref 8.4–10.5)
CO2: 27 mEq/L (ref 19–32)
CREATININE: 0.71 mg/dL (ref 0.50–1.35)
Chloride: 100 mEq/L (ref 96–112)
GFR calc non Af Amer: >90 mL/min (ref >90)
GLUCOSE: 111 mg/dL — AB (ref 70–99)
Potassium: 4.5 mEq/L (ref 3.7–5.3)
Sodium: 138 mEq/L (ref 137–147)
TOTAL PROTEIN: 7.5 g/dL (ref 6.0–8.3)
Total Bilirubin: 0.4 mg/dL (ref 0.3–1.2)

## 2013-09-08 LAB — LIPASE, BLOOD: LIPASE: 46 U/L (ref 11–59)

## 2013-09-08 LAB — I-STAT CG4 LACTIC ACID, ED: Lactic Acid, Venous: 0.68 mmol/L (ref 0.5–2.2)

## 2013-09-08 MED ORDER — IOHEXOL 300 MG/ML  SOLN
100.0000 mL | Freq: Once | INTRAMUSCULAR | Status: AC | PRN
  Administered 2013-09-08: 100 mL via INTRAVENOUS

## 2013-09-08 MED ORDER — HYDROMORPHONE HCL PF 1 MG/ML IJ SOLN
1.0000 mg | Freq: Once | INTRAMUSCULAR | Status: AC
  Administered 2013-09-08: 1 mg via INTRAVENOUS
  Filled 2013-09-08: qty 1

## 2013-09-08 MED ORDER — IOHEXOL 300 MG/ML  SOLN
50.0000 mL | Freq: Once | INTRAMUSCULAR | Status: AC | PRN
  Administered 2013-09-08: 50 mL via ORAL

## 2013-09-08 MED ORDER — ONDANSETRON HCL 4 MG/2ML IJ SOLN
4.0000 mg | Freq: Once | INTRAMUSCULAR | Status: AC
  Administered 2013-09-08: 4 mg via INTRAVENOUS
  Filled 2013-09-08: qty 2

## 2013-09-08 MED ORDER — SODIUM CHLORIDE 0.9 % IV SOLN
1000.0000 mL | Freq: Once | INTRAVENOUS | Status: AC
  Administered 2013-09-08: 1000 mL via INTRAVENOUS

## 2013-09-08 NOTE — ED Provider Notes (Signed)
CSN: [id number]     Arrival date & time 09/08/13  1609 History   First MD Initiated Contact with Patient 09/08/13 1559     Chief Complaint  Patient presents with  . Abdominal Pain  . Groin Swelling    HPI  Patient presents with concern of abdominal and scrotal pain. Symptoms began yesterday.  The patient does have chronic pain, but states that his pain has become severe, diffuse, worsening since yesterday, with no clear precipitant for the exacerbation. There is associated nausea, vomiting, subjective fever and chills. There is associated discomfort in the scrotum described as swelling. There is no new urinary change.  The patient does have diarrhea. Notably he also has MS, is wheelchair dependent, has a nonhealing sacral decubitus ulcer  Past Medical History  Diagnosis Date  . Hypertension   . Duodenal ulcer hemorrhage   . Hypertrophy of prostate   . Generalized muscle weakness   . Acute respiratory failure   . Cognitive communication deficit   . MRSA (methicillin resistant Staphylococcus aureus)   . Kidney stone    Past Surgical History  Procedure Laterality Date  . Knee arthroscopy      right  . Total knee arthroplasty      right  . Wrist surgery      right  . Neck surgery    . Exploratory laparotomy  02/2011    Duodenotomy, oversew of gastric ulcer/duodenal artery  . Appendectomy    . Tibia fracture surgery    . Olecranon bursectomy Left 09/08/2012    Procedure: OLECRANON BURSA;  Surgeon: Velna OchsPeter G Dalldorf, MD;  Location: [facility];  Service: Orthopedics;  Laterality: Left;  LEFT  ELBOW  OLECRANON  BURSA  INCISION   Family History  Problem Relation Age of Onset  . Hypertension Father    History  Substance Use Topics  . Smoking status: Former Smoker -- 40 years    Quit date: 03/08/2011  . Smokeless tobacco: Never Used  . Alcohol Use: No     Comment: states he stopped 2 yrs ago    Review of Systems  Constitutional:       Per HPI, otherwise negative  HENT:     Per HPI, otherwise negative  Respiratory:       Per HPI, otherwise negative  Cardiovascular:       Per HPI, otherwise negative  Gastrointestinal: Negative for vomiting.  Endocrine:       Negative aside from HPI  Genitourinary:       Neg aside from HPI   Musculoskeletal:       Per HPI, otherwise negative  Skin: Negative.   Neurological: Negative for syncope.      Allergies  Aspirin and Ibuprofen  Home Medications   Current Outpatient Rx  Name  Route  Sig  Dispense  Refill  . acetaminophen (TYLENOL) 325 MG tablet   Oral   Take 650 mg by mouth every 6 (six) hours as needed for pain.         Marland Kitchen. acetaminophen (TYLENOL) 500 MG tablet   Oral   Take 1,000 mg by mouth at bedtime. For pain         . HYDROcodone-acetaminophen (NORCO/VICODIN) 5-325 MG per tablet      Take one tablet every 6 hours as needed for pain   120 tablet   5   . lisinopril (PRINIVIL,ZESTRIL) 10 MG tablet   Oral   Take 10 mg by mouth every morning.          .Marland Kitchen  loratadine (CLARITIN) 10 MG tablet   Oral   Take 10 mg by mouth daily as needed for allergies.         . magnesium oxide (MAG-OX) 400 MG tablet   Oral   Take 400 mg by mouth at bedtime.          . Multiple Vitamin (MULTIVITAMIN WITH MINERALS) TABS   Oral   Take 2 tablets by mouth every morning.          . mupirocin cream (BACTROBAN) 2 %   Topical   Apply 1 application topically 3 (three) times daily.   30 g   0   . omeprazole (PRILOSEC) 20 MG capsule   Oral   Take 20 mg by mouth every morning.          Marland Kitchen rOPINIRole (REQUIP) 0.25 MG tablet   Oral   Take 0.25 mg by mouth at bedtime.         . senna (SENOKOT) 8.6 MG TABS   Oral   Take 1 tablet by mouth at bedtime.          . sertraline (ZOLOFT) 50 MG tablet   Oral   Take 50 mg by mouth at bedtime.          . traZODone (DESYREL) 50 MG tablet   Oral   Take 125 mg by mouth at bedtime.          BP 138/87  Pulse 75  Temp(Src) 98.9 F (37.2 C) (Oral)   Resp 16  SpO2 98% Physical Exam  Nursing note and vitals reviewed. Constitutional: He is oriented to person, place, and time. He appears well-developed. No distress.  HENT:  Head: Normocephalic and atraumatic.  Eyes: Conjunctivae and EOM are normal.  Cardiovascular: Normal rate and regular rhythm.   Pulmonary/Chest: Effort normal. No stridor. No respiratory distress.  Abdominal: He exhibits no distension. There is no hepatosplenomegaly. There is generalized tenderness. There is guarding. There is no rigidity, no rebound and no CVA tenderness.  Genitourinary:     Musculoskeletal: He exhibits no edema.       Arms: Neurological: He is alert and oriented to person, place, and time.  Skin: Skin is warm and dry.  Psychiatric: He has a normal mood and affect.    ED Course  Procedures (including critical care time) Labs Review Labs Reviewed  CBC WITH DIFFERENTIAL - Abnormal; Notable for the following:    WBC 3.9 (*)    RBC 4.09 (*)    HCT 38.4 (*)    All other components within normal limits  COMPREHENSIVE METABOLIC PANEL - Abnormal; Notable for the following:    Glucose, Bld 111 (*)    AST 69 (*)    ALT 69 (*)    All other components within normal limits  URINALYSIS, ROUTINE W REFLEX MICROSCOPIC - Abnormal; Notable for the following:    APPearance CLOUDY (*)    Urobilinogen, UA 2.0 (*)    All other components within normal limits  LIPASE, BLOOD  I-STAT CG4 LACTIC ACID, ED   Imaging Review Ct Abdomen Pelvis W Contrast  09/08/2013   CLINICAL DATA:  Abdominal pain and groin pain. Groin swelling. Rule out for Fournier's gangrene.  EXAM: CT ABDOMEN AND PELVIS WITH CONTRAST  TECHNIQUE: Multidetector CT imaging of the abdomen and pelvis was performed using the standard protocol following bolus administration of intravenous contrast.  CONTRAST:  50mL OMNIPAQUE IOHEXOL 300 MG/ML SOLN, OMNIPAQUE IOHEXOL 300 MG/ML SOLN  COMPARISON:  CT  abdomen pelvis 06/26/2012 and sacrum coccyx  radiographs 06/25/2013  FINDINGS: The scout view of the abdomen pelvis shows no obvious gas in the region of the perineum.  The patient's  right arm projects over the abdomen.  The imaged lung bases show no evidence of airspace disease or pleural effusion. There is mild gaseous distention of the distal esophagus. Negative for pneumoperitoneum.  There is a paucity of intra-abdominal fat, which limits the sensitivity for detecting acute inflammatory changes. Patient is status post cholecystectomy. The liver, spleen, kidneys, pancreas, and adrenal glands appear within normal limits. Bowel loops are normal in caliber. There is a fairly prominent amount of stool throughout the colon. No evidence of bowel wall thickening. Urinary bladder is moderately distended and appears normal. Prostate gland mildly prominent.  Abdominal aorta is normal in caliber and contains scattered atherosclerotic calcification.  There is asymmetry of the psoas muscles, left larger than right, stable. There is a convex left scoliosis of the lower lumbar spine which may account for this muscle asymmetry.  No definite ascites; assessment limited due to lack of intra-abdominal fat. No perineal subcutaneous gas or definite skin thickening is seen by CT. The very inferior aspect of the scrotum is not completely imaged.  The patient has chronic severe degenerative changes of L3 through S1 with endplate sclerosis and irregularity and disc height narrowing. This appears stable compared to a CT of 06/26/2012. There are mild chronic degenerative changes of the pubic symphysis and hips bilaterally. No acute bony abnormality is identified.  IMPRESSION: 1. No CT evidence of Fournier's gangrene is identified. The very inferior aspect of the scrotum is not completed included on these images. No subcutaneous gas is identified. 2. Paucity of intra-abdominal fat limits the evaluation for acute inflammatory changes. No definite explanation for patient's pain is  identified. 3. Severe chronic degenerative changes of the lower lumbar spine. 4. Mild chronic degenerative changes of both hips and the pubic symphysis.   Electronically Signed   By: Britta Mccreedy M.D.   On: 09/08/2013 18:00     I reviewed the EMR.   6:46 PM On re-exam the patient is sitting upright, speaking clearly. I reviewed all results with him.  No significantly acute findings.  7:36 PM Patient hemodynamically stable, no new complaints. MDM  This patient with multiple medical problems, including chronic nonhealing sacral decubitus ulcer, wheelchair dependency now presents with abdominal pain, concern of swollen testicles.  On exam he is awake, alert, afebrile, hemodynamically stable, with a non-remarkable genital exam, though he does have tenderness to palpation about the abdomen, and given his description of pain in the scrotum, abdominal pain, the ulcer, and his chronic disease, deep space infection was a consideration.  The patient's evaluation, including CT scan was largely reassuring, with no leukopenia, but no leukocytosis, no evidence of obstruction or abscess on CT. The patient had some improvement in his condition here with analgesics, and is on chronic narcotics at his nursing facility.  Absent distress, with largely reassuring findings, vital signs, is low suspicion for occult infection or other systemic pathology.  Patient is a chronic condition, and will require followup as an outpatient, but he seems stable to do so at this point    Gerhard Munch, MD 09/08/13 [phone]

## 2013-09-08 NOTE — Discharge Instructions (Signed)
As discussed, your evaluation today has been largely reassuring.  But, it is important that you monitor your condition carefully, and do not hesitate to return to the ED if you develop new, or concerning changes in your condition. ° °Otherwise, please follow-up with your physician for appropriate ongoing care. ° ° ° °Abdominal Pain, Adult °Many things can cause abdominal pain. Usually, abdominal pain is not caused by a disease and will improve without treatment. It can often be observed and treated at home. Your health care provider will do a physical exam and possibly order blood tests and X-rays to help determine the seriousness of your pain. However, in many cases, more time must pass before a clear cause of the pain can be found. Before that point, your health care provider may not know if you need more testing or further treatment. °HOME CARE INSTRUCTIONS  °Monitor your abdominal pain for any changes. The following actions may help to alleviate any discomfort you are experiencing: °· Only take over-the-counter or prescription medicines as directed by your health care provider. °· Do not take laxatives unless directed to do so by your health care provider. °· Try a clear liquid diet (broth, tea, or water) as directed by your health care provider. Slowly move to a bland diet as tolerated. °SEEK MEDICAL CARE IF: °· You have unexplained abdominal pain. °· You have abdominal pain associated with nausea or diarrhea. °· You have pain when you urinate or have a bowel movement. °· You experience abdominal pain that wakes you in the night. °· You have abdominal pain that is worsened or improved by eating food. °· You have abdominal pain that is worsened with eating fatty foods. °SEEK IMMEDIATE MEDICAL CARE IF:  °· Your pain does not go away within 2 hours. °· You have a fever. °· You keep throwing up (vomiting). °· Your pain is felt only in portions of the abdomen, such as the right side or the left lower portion of the  abdomen. °· You pass bloody or black tarry stools. °MAKE SURE YOU: °· Understand these instructions.   °· Will watch your condition.   °· Will get help right away if you are not doing well or get worse.   °Document Released: 03/24/2005 Document Revised: 04/04/2013 Document Reviewed: 02/21/2013 °[employer]® Patient Information ©2014 [employer]. ° °

## 2013-09-08 NOTE — ED Notes (Signed)
Bed: [id number] Expected date:  Expected time:  Means of arrival:  Comments: EMS- Abdominal pain

## 2013-09-08 NOTE — ED Notes (Signed)
Pt from [facility] via EMS-per EMS pt had sudden onset of abd pain per staff today. Pt adds that his testicles are swollen. Pt also has decubitus on bottom that is causing him discomfort. Pt is A&O and in NAD.

## 2013-09-08 NOTE — ED Notes (Signed)
[facility] called for transportation [facility].

## 2013-09-26 ENCOUNTER — Encounter (HOSPITAL_BASED_OUTPATIENT_CLINIC_OR_DEPARTMENT_OTHER): Payer: Medicaid Other | Attending: General Surgery

## 2013-09-26 DIAGNOSIS — L89109 Pressure ulcer of unspecified part of back, unspecified stage: Secondary | ICD-10-CM | POA: Insufficient documentation

## 2013-09-26 DIAGNOSIS — L8993 Pressure ulcer of unspecified site, stage 3: Secondary | ICD-10-CM | POA: Insufficient documentation

## 2013-09-26 DIAGNOSIS — L84 Corns and callosities: Secondary | ICD-10-CM | POA: Insufficient documentation

## 2013-10-10 ENCOUNTER — Emergency Department (HOSPITAL_COMMUNITY)
Admission: EM | Admit: 2013-10-10 | Discharge: 2013-10-10 | Disposition: A | Discharge status: Home / Self Care | Payer: Medicaid Other | Attending: Emergency Medicine | Admitting: Emergency Medicine

## 2013-10-10 ENCOUNTER — Encounter (HOSPITAL_COMMUNITY): Payer: Self-pay | Admitting: Emergency Medicine

## 2013-10-10 ENCOUNTER — Emergency Department (HOSPITAL_COMMUNITY): Payer: Medicaid Other

## 2013-10-10 DIAGNOSIS — S70211A Abrasion, right hip, initial encounter: Secondary | ICD-10-CM

## 2013-10-10 DIAGNOSIS — I10 Essential (primary) hypertension: Secondary | ICD-10-CM | POA: Insufficient documentation

## 2013-10-10 DIAGNOSIS — Z87891 Personal history of nicotine dependence: Secondary | ICD-10-CM | POA: Insufficient documentation

## 2013-10-10 DIAGNOSIS — Z9889 Other specified postprocedural states: Secondary | ICD-10-CM | POA: Insufficient documentation

## 2013-10-10 DIAGNOSIS — Z79899 Other long term (current) drug therapy: Secondary | ICD-10-CM | POA: Insufficient documentation

## 2013-10-10 DIAGNOSIS — Y9389 Activity, other specified: Secondary | ICD-10-CM | POA: Insufficient documentation

## 2013-10-10 DIAGNOSIS — M1610 Unilateral primary osteoarthritis, unspecified hip: Secondary | ICD-10-CM | POA: Insufficient documentation

## 2013-10-10 DIAGNOSIS — Z8719 Personal history of other diseases of the digestive system: Secondary | ICD-10-CM | POA: Insufficient documentation

## 2013-10-10 DIAGNOSIS — IMO0002 Reserved for concepts with insufficient information to code with codable children: Secondary | ICD-10-CM | POA: Insufficient documentation

## 2013-10-10 DIAGNOSIS — W06XXXA Fall from bed, initial encounter: Secondary | ICD-10-CM | POA: Insufficient documentation

## 2013-10-10 DIAGNOSIS — Y92009 Unspecified place in unspecified non-institutional (private) residence as the place of occurrence of the external cause: Secondary | ICD-10-CM | POA: Insufficient documentation

## 2013-10-10 DIAGNOSIS — Z8709 Personal history of other diseases of the respiratory system: Secondary | ICD-10-CM | POA: Insufficient documentation

## 2013-10-10 DIAGNOSIS — Z87448 Personal history of other diseases of urinary system: Secondary | ICD-10-CM | POA: Insufficient documentation

## 2013-10-10 DIAGNOSIS — Z87442 Personal history of urinary calculi: Secondary | ICD-10-CM | POA: Insufficient documentation

## 2013-10-10 MED ORDER — HYDROCODONE-ACETAMINOPHEN 5-325 MG PO TABS
1.0000 | ORAL_TABLET | Freq: Once | ORAL | Status: AC
  Administered 2013-10-10: 1 via ORAL
  Filled 2013-10-10: qty 1

## 2013-10-10 MED ORDER — ACETAMINOPHEN 325 MG PO TABS
650.0000 mg | ORAL_TABLET | Freq: Once | ORAL | Status: DC
  Filled 2013-10-10: qty 2

## 2013-10-10 NOTE — ED Notes (Signed)
Pt from [facility], pt reportedly slid from bed last night and initaly had no compliants, woke up this am and complains of right side, hip buttock and back pain. Hx of MR

## 2013-10-10 NOTE — ED Notes (Signed)
Pt provided coke, crackers and peanut butter. Calling [facility] to take [facility].

## 2013-10-10 NOTE — Discharge Instructions (Signed)
Read the information below.  You may return to the Emergency Department at any time for worsening condition or any new symptoms that concern you.  If you develop uncontrolled pain, weakness or numbness of the extremity, severe discoloration of the skin, or you are unable to move your legs, return to the ER for a recheck.      Abrasions An abrasion is a cut or scrape of the skin. Abrasions do not go through all layers of the skin. HOME CARE  If a bandage (dressing) was put on your wound, change it as told by your doctor. If the bandage sticks, soak it off with warm.  Wash the area with water and soap 2 times a day. Rinse off the soap. Pat the area dry with a clean towel.  Put on medicated cream (ointment) as told by your doctor.  Change your bandage right away if it gets wet or dirty.  Only take medicine as told by your doctor.  See your doctor within 24 48 hours to get your wound checked.  Check your wound for redness, puffiness (swelling), or yellowish-white fluid (pus). GET HELP RIGHT AWAY IF:   You have more pain in the wound.  You have redness, swelling, or tenderness around the wound.  You have pus coming from the wound.  You have a fever or lasting symptoms for more than 2 3 days.  You have a fever and your symptoms suddenly get worse.  You have a bad smell coming from the wound or bandage. MAKE SURE YOU:   Understand these instructions.  Will watch your condition.  Will get help right away if you are not doing well or get worse. Document Released: 12/01/2007 Document Revised: 03/08/2012 Document Reviewed: 05/18/2011 [facility] Patient Information 2014 [place], [place].  Hip Pain The hips join the upper legs to the lower pelvis. The bones, cartilage, tendons, and muscles of the hip joint perform a lot of work each day holding your body weight and allowing you to move around. Hip pain is a common symptom. It can range from a minor ache to severe pain on 1 or both hips.  Pain may be felt on the inside of the hip joint near the groin, or the outside near the buttocks and upper thigh. There may be swelling or stiffness as well. It occurs more often when a person walks or performs activity. There are many reasons hip pain can develop. CAUSES  It is important to work with your caregiver to identify the cause since many conditions can impact the bones, cartilage, muscles, and tendons of the hips. Causes for hip pain include:  Broken (fractured) bones.  Separation of the thighbone from the hip socket (dislocation).  Torn cartilage of the hip joint.  Swelling (inflammation) of a tendon (tendonitis), the sac within the hip joint (bursitis), or a joint.  A weakening in the abdominal wall (hernia), affecting the nerves to the hip.  Arthritis in the hip joint or lining of the hip joint.  Pinched nerves in the back, hip, or upper thigh.  A bulging disc in the spine (herniated disc).  Rarely, bone infection or cancer. DIAGNOSIS  The location of your hip pain will help your caregiver understand what may be causing the pain. A diagnosis is based on your medical history, your symptoms, results from your physical exam, and results from diagnostic tests. Diagnostic tests may include X-ray exams, a computerized magnetic scan (magnetic resonance imaging, MRI), or bone scan. TREATMENT  Treatment will depend on  the cause of your hip pain. Treatment may include:  Limiting activities and resting until symptoms improve.  Crutches or other walking supports (a cane or brace).  Ice, elevation, and compression.  Physical therapy or home exercises.  Shoe inserts or special shoes.  Losing weight.  Medications to reduce pain.  Undergoing surgery. HOME CARE INSTRUCTIONS   Only take over-the-counter or prescription medicines for pain, discomfort, or fever as directed by your caregiver.  Put ice on the injured area:  Put ice in a plastic bag.  Place a towel between  your skin and the bag.  Leave the ice on for 15-20 minutes at a time, 03-04 times a day.  Keep your leg raised (elevated) when possible to lessen swelling.  Avoid activities that cause pain.  Follow specific exercises as directed by your caregiver.  Sleep with a pillow between your legs on your most comfortable side.  Record how often you have hip pain, the location of the pain, and what it feels like. This information may be helpful to you and your caregiver.  Ask your caregiver about returning to work or sports and whether you should drive.  Follow up with your caregiver for further exams, therapy, or testing as directed. SEEK MEDICAL CARE IF:   Your pain or swelling continues or worsens after 1 week.  You are feeling unwell or have chills.  You have increasing difficulty with walking.  You have a loss of sensation or other new symptoms.  You have questions or concerns. SEEK IMMEDIATE MEDICAL CARE IF:   You cannot put weight on the affected hip.  You have fallen.  You have a sudden increase in pain and swelling in your hip.  You have a fever. MAKE SURE YOU:   Understand these instructions.  Will watch your condition.  Will get help right away if you are not doing well or get worse. Document Released: 12/02/2009 Document Revised: 09/06/2011 Document Reviewed: 12/02/2009 [facility] Patient Information 2014 [place], [place].  Fall Prevention in Hospitals As a hospital patient, your condition and the treatments you receive can increase your risk for falls. Some additional risk factors for falls in a hospital include:  Being in an unfamiliar environment.  Being on bed rest.  Your surgery.  Taking certain medicines.  Your tubing requirements, such as intravenous (IV) therapy or catheters. It is important that you learn how to decrease fall risks while at the hospital. Below are important tips that can help prevent falls. SAFETY TIPS FOR PREVENTING FALLS Talk  about your risk of falling.  Ask your caregiver why you are at risk for falling. Is it your medicine, illness, tubing placement, or something else?  Make a plan with your caregiver to keep you safe from falls.  Ask your caregiver or pharmacist about side effect of your medicines. Some medicines can make you dizzy or affect your coordination. Ask for help.  Ask for help before getting out of bed. You may need to press your call button.  Ask for assistance in getting you safely to the toilet.  Ask for a walker or cane to be put at your bedside. Ask that most of the side rails on your bed be placed up before your caregiver leaves the room.  Ask family or friends to sit with you.  Ask for things that are out of your reach, such as your glasses, hearing aids, telephone, bedside table, or call button. Follow these tips to avoid falling:  Stay lying or seated, rather than  standing, while waiting for help.  Wear rubber-soled slippers or shoes whenever you walk in the hospital.  Avoid quick, sudden movements.  Change positions slowly.  Sit on the side of your bed before standing.  Stand up slowly and wait before you start to walk.  Let your caregiver know if there is a spill on the floor.  Pay careful attention to the medical equipment, electrical cords, and tubes around you.  When you need help, use your call button by your bed or in the bathroom. Wait for one of your caregivers to help you.  If you feel dizzy or unsure of your footing, return to bed and wait for assistance.  Avoid being distracted by the TV, telephone, or another person in your room.  Do not lean or support yourself on rolling objects, such as IV poles or bedside tables. Document Released: 06/11/2000 Document Revised: 05/31/2012 Document Reviewed: 02/20/2012 [facility] Patient Information 2014 [place], [place].

## 2013-10-10 NOTE — ED Provider Notes (Signed)
CSN: [id number]     Arrival date & time 10/10/13  0636 History   First MD Initiated Contact with Patient 10/10/13 [phone]     Chief Complaint  Patient presents with  . Fall     (Consider location/radiation/quality/duration/timing/severity/associated sxs/prior Treatment) The history is provided by the patient.    Pt brought in from [facility] with c/o right hip and right buttock pain after sliding off of his bed last night.  Pt states he slid off the bed "because the mattress is too firm."  Denies other injury.  Pain is 9/10 intensity, no radiation.  No weakness or numbness.  No LOC.  No headache.   Past Medical History  Diagnosis Date  . Hypertension   . Duodenal ulcer hemorrhage   . Hypertrophy of prostate   . Generalized muscle weakness   . Acute respiratory failure   . Cognitive communication deficit   . MRSA (methicillin resistant Staphylococcus aureus)   . Kidney stone    Past Surgical History  Procedure Laterality Date  . Knee arthroscopy      right  . Total knee arthroplasty      right  . Wrist surgery      right  . Neck surgery    . Exploratory laparotomy  02/2011    Duodenotomy, oversew of gastric ulcer/duodenal artery  . Appendectomy    . Tibia fracture surgery    . Olecranon bursectomy Left 09/08/2012    Procedure: OLECRANON BURSA;  Surgeon: Velna Ochs, MD;  Location: [facility];  Service: Orthopedics;  Laterality: Left;  LEFT  ELBOW  OLECRANON  BURSA  INCISION   Family History  Problem Relation Age of Onset  . Hypertension Father    History  Substance Use Topics  . Smoking status: Former Smoker -- 40 years    Quit date: 03/08/2011  . Smokeless tobacco: Never Used  . Alcohol Use: No     Comment: states he stopped 2 yrs ago    Review of Systems  Respiratory: Negative for shortness of breath.   Cardiovascular: Negative for chest pain.  Gastrointestinal: Positive for abdominal pain (unchanged since yesterday).  Musculoskeletal: Positive for arthralgias.  Negative for back pain.  Neurological: Negative for syncope, weakness, numbness and headaches.      Allergies  Aspirin and Ibuprofen  Home Medications   Prior to Admission medications   Medication Sig Start Date End Date Taking? Authorizing Provider  acetaminophen (TYLENOL) 325 MG tablet Take 650 mg by mouth every 6 (six) hours as needed for pain.    Historical Provider, MD  acetaminophen (TYLENOL) 500 MG tablet Take 1,000 mg by mouth at bedtime. For pain    Historical Provider, MD  docusate sodium (COLACE) 100 MG capsule Take 100 mg by mouth at bedtime as needed for mild constipation.    Historical Provider, MD  HYDROcodone-acetaminophen (NORCO/VICODIN) 5-325 MG per tablet Take one tablet every 6 hours as needed for pain 10/02/12   Tiffany L Reed, DO  lisinopril (PRINIVIL,ZESTRIL) 10 MG tablet Take 10 mg by mouth every morning.     Historical Provider, MD  loratadine (CLARITIN) 10 MG tablet Take 10 mg by mouth daily as needed for allergies.    Historical Provider, MD  magnesium oxide (MAG-OX) 400 MG tablet Take 400 mg by mouth at bedtime.     Historical Provider, MD  Multiple Vitamin (MULTIVITAMIN WITH MINERALS) TABS Take 2 tablets by mouth every morning.     Historical Provider, MD  omeprazole (PRILOSEC) 20 MG  capsule Take 20 mg by mouth every morning.     Historical Provider, MD  rOPINIRole (REQUIP) 0.25 MG tablet Take 0.25 mg by mouth at bedtime.    Historical Provider, MD  senna (SENOKOT) 8.6 MG TABS Take 1 tablet by mouth at bedtime.     Historical Provider, MD  sertraline (ZOLOFT) 50 MG tablet Take 50 mg by mouth at bedtime.     Historical Provider, MD  traZODone (DESYREL) 50 MG tablet Take 125 mg by mouth at bedtime.    Historical Provider, MD   BP 153/100  Pulse 75  Temp(Src) 97.3 F (36.3 C) (Oral)  Resp 18  Ht 6\' 6"  (1.981 m)  Wt 155 lb (70.308 kg)  BMI 17.92 kg/m2  SpO2 100% Physical Exam  Nursing note and vitals reviewed. Constitutional: He appears well-developed and  well-nourished. No distress.  Pt is sitting on right side/right hip but insists this is the side that hurts.  Comfortable appearing.   HENT:  Head: Normocephalic and atraumatic.  Neck: Neck supple.  Cardiovascular: Normal rate and regular rhythm.   Pulmonary/Chest: Effort normal and breath sounds normal. No respiratory distress. He has no wheezes. He has no rales.  Abdominal: Soft. He exhibits no distension and no mass. There is no tenderness. There is no rebound and no guarding.  Musculoskeletal:       Right hip: Normal.       Left hip: Normal.       Back:  Neurological: He is alert. He has normal strength. No cranial nerve deficit or sensory deficit. He exhibits normal muscle tone. GCS eye subscore is 4. GCS verbal subscore is 5. GCS motor subscore is 6.  Skin: He is not diaphoretic.       ED Course  Procedures (including critical care time) Labs Review Labs Reviewed - No data to display  Imaging Review Dg Hip Complete Right  10/10/2013   CLINICAL DATA:  Pain post trauma  EXAM: RIGHT HIP - COMPLETE 2+ VIEW  COMPARISON:  June 25, 2013  FINDINGS: Frontal pelvis as well as frontal and lateral views of the right hip were obtained. There is moderate osteoarthritic change in both hip joints, slightly more on the right than on the left, stable. There is no acute fracture or dislocation. No erosive change. There is arthropathy in the lower lumbar spine with levoscoliosis.  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 Osteoarthritic change in the visualized lumbar spine. No acute fracture or dislocation.   Electronically Signed   By: Bretta BangWilliam  Woodruff M.D.   On: 10/10/2013 07:24     EKG Interpretation None      Discussed pt with Dr Ranae PalmsYelverton.   MDM   Final diagnoses:  Fall from bed  Abrasion of right hip  Arthritis, hip    Pt with accidental fall from bed last night.  Reporting pain in right hip this morning.  Has small  abrasion to right hip without e/o infection.  No bony tenderness on exam, full passive ROM without pain.  Neurovascularly intact.  No other complaints.  Pt does note continued abdominal pain, unchanged from yesterday on ROS but abdominal exam is benign and workup yesterday was unremarkable.  No reported head injury.  Neurologically grossly intact.  Pt requesting food and pain medication, given both prior to discharge.  Discussed result, findings, treatment, and follow up  with patient.  Pt given return precautions.  Pt verbalizes understanding and agrees with plan.  NeedhamEmily Irish Piech, PA-C 10/10/13 [phone]

## 2013-10-10 NOTE — ED Notes (Signed)
Pt requesting something stronger than tylenol. PA made aware.

## 2013-10-14 NOTE — ED Provider Notes (Signed)
Medical screening examination/treatment/procedure(s) were performed by non-physician practitioner and as supervising physician I was immediately available for consultation/collaboration.   EKG Interpretation None        Cruz Bong, MD 10/14/13 0638 

## 2013-10-16 ENCOUNTER — Encounter (HOSPITAL_COMMUNITY): Payer: Self-pay | Admitting: Emergency Medicine

## 2013-10-16 ENCOUNTER — Emergency Department (HOSPITAL_COMMUNITY)
Admission: EM | Admit: 2013-10-16 | Discharge: 2013-10-16 | Disposition: A | Discharge status: Home / Self Care | Payer: Medicaid Other | Attending: Emergency Medicine | Admitting: Emergency Medicine

## 2013-10-16 DIAGNOSIS — Z8614 Personal history of Methicillin resistant Staphylococcus aureus infection: Secondary | ICD-10-CM | POA: Insufficient documentation

## 2013-10-16 DIAGNOSIS — Y9389 Activity, other specified: Secondary | ICD-10-CM | POA: Insufficient documentation

## 2013-10-16 DIAGNOSIS — Y9210 Unspecified residential institution as the place of occurrence of the external cause: Secondary | ICD-10-CM | POA: Insufficient documentation

## 2013-10-16 DIAGNOSIS — Z8709 Personal history of other diseases of the respiratory system: Secondary | ICD-10-CM | POA: Insufficient documentation

## 2013-10-16 DIAGNOSIS — Z87448 Personal history of other diseases of urinary system: Secondary | ICD-10-CM | POA: Insufficient documentation

## 2013-10-16 DIAGNOSIS — S0180XA Unspecified open wound of other part of head, initial encounter: Secondary | ICD-10-CM | POA: Insufficient documentation

## 2013-10-16 DIAGNOSIS — Z23 Encounter for immunization: Secondary | ICD-10-CM | POA: Insufficient documentation

## 2013-10-16 DIAGNOSIS — W1809XA Striking against other object with subsequent fall, initial encounter: Secondary | ICD-10-CM | POA: Insufficient documentation

## 2013-10-16 DIAGNOSIS — Z8719 Personal history of other diseases of the digestive system: Secondary | ICD-10-CM | POA: Insufficient documentation

## 2013-10-16 DIAGNOSIS — Z87891 Personal history of nicotine dependence: Secondary | ICD-10-CM | POA: Insufficient documentation

## 2013-10-16 DIAGNOSIS — I10 Essential (primary) hypertension: Secondary | ICD-10-CM | POA: Insufficient documentation

## 2013-10-16 DIAGNOSIS — Z79899 Other long term (current) drug therapy: Secondary | ICD-10-CM | POA: Insufficient documentation

## 2013-10-16 DIAGNOSIS — S0990XA Unspecified injury of head, initial encounter: Secondary | ICD-10-CM

## 2013-10-16 DIAGNOSIS — Z87442 Personal history of urinary calculi: Secondary | ICD-10-CM | POA: Insufficient documentation

## 2013-10-16 DIAGNOSIS — Z8669 Personal history of other diseases of the nervous system and sense organs: Secondary | ICD-10-CM | POA: Insufficient documentation

## 2013-10-16 MED ORDER — TETANUS-DIPHTH-ACELL PERTUSSIS 5-2.5-18.5 LF-MCG/0.5 IM SUSP
0.5000 mL | Freq: Once | INTRAMUSCULAR | Status: AC
  Administered 2013-10-16: 0.5 mL via INTRAMUSCULAR
  Filled 2013-10-16: qty 0.5

## 2013-10-16 MED ORDER — HYDROCODONE-ACETAMINOPHEN 5-325 MG PO TABS
1.0000 | ORAL_TABLET | Freq: Once | ORAL | Status: AC
  Administered 2013-10-16: 1 via ORAL
  Filled 2013-10-16: qty 1

## 2013-10-16 NOTE — ED Provider Notes (Addendum)
Slipped and fell from a chair striking his for head creating laceration. Patient is alert pleasant cooperative moves all extremities HEENT exam he has a 2 cm linear clean appearing laceration at right immediately above and parallel parietal eyebrow  Doug SouSam Gaige Sebo, MD 10/16/13 1710  Doug SouSam Brody Bonneau, MD 10/16/13 2051

## 2013-10-16 NOTE — Discharge Instructions (Signed)
Head Injury, Adult  You have received a head injury. It does not appear serious at this time. Headaches and vomiting are common following head injury. It should be easy to awaken from sleeping. Sometimes it is necessary for you to stay in the emergency department for a while for observation. Sometimes admission to the hospital may be needed. After injuries such as yours, most problems occur within the first 24 hours, but side effects may occur up to 7 10 days after the injury. It is important for you to carefully monitor your condition and contact your health care provider or seek immediate medical care if there is a change in your condition.  WHAT ARE THE TYPES OF HEAD INJURIES?  Head injuries can be as minor as a bump. Some head injuries can be more severe. More severe head injuries include:  · A jarring injury to the brain (concussion).  · A bruise of the brain (contusion). This mean there is bleeding in the brain that can cause swelling.  · A cracked skull (skull fracture).  · Bleeding in the brain that collects, clots, and forms a bump (hematoma).  WHAT CAUSES A HEAD INJURY?  A serious head injury is most likely to happen to someone who is in a car wreck and is not wearing a seat belt. Other causes of major head injuries include bicycle or motorcycle accidents, sports injuries, and falls.  HOW ARE HEAD INJURIES DIAGNOSED?  A complete history of the event leading to the injury and your current symptoms will be helpful in diagnosing head injuries. Many times, pictures of the brain, such as CT or MRI are needed to see the extent of the injury. Often, an overnight hospital stay is necessary for observation.   WHEN SHOULD I SEEK IMMEDIATE MEDICAL CARE?   You should get help right away if:  · You have confusion or drowsiness.  · You feel sick to your stomach (nauseous) or have continued, forceful vomiting.  · You have dizziness or unsteadiness that is getting worse.  · You have severe, continued headaches not  relieved by medicine. Only take over-the-counter or prescription medicines for pain, fever, or discomfort as directed by your health care provider.  · You do not have normal function of the arms or legs or are unable to walk.  · You notice changes in the black spots in the center of the colored part of your eye (pupil).  · You have a clear or bloody fluid coming from your nose or ears.  · You have a loss of vision.  During the next 24 hours after the injury, you must stay with someone who can watch you for the warning signs. This person should contact local emergency services (911 in the [place] if you have seizures, you become unconscious, or you are unable to wake up.  HOW CAN I PREVENT A HEAD INJURY IN THE FUTURE?  The most important factor for preventing major head injuries is avoiding motor vehicle accidents.  To minimize the potential for damage to your head, it is crucial to wear seat belts while riding in motor vehicles. Wearing helmets while bike riding and playing collision sports (like football) is also helpful. Also, avoiding dangerous activities around the house will further help reduce your risk of head injury.   WHEN CAN I RETURN TO NORMAL ACTIVITIES AND ATHLETICS?  You should be reevaluated by your health care provider before returning to these activities. If you have any of the following symptoms, you should not return   to activities or contact sports until 1 week after the symptoms have stopped:  · Persistent headache.  · Dizziness or vertigo.  · Poor attention and concentration.  · Confusion.  · Memory problems.  · Nausea or vomiting.  · Fatigue or tire easily.  · Irritability.  · Intolerant of bright lights or loud noises.  · Anxiety or depression.  · Disturbed sleep.  MAKE SURE YOU:   · Understand these instructions.  · Will watch your condition.  · Will get help right away if you are not doing well or get worse.  Document Released: 06/14/2005 Document Revised: 04/04/2013 Document Reviewed:  02/19/2013  ExitCare® Patient Information ©2014 [employer], LLC.

## 2013-10-16 NOTE — ED Notes (Signed)
Per EMS:  Patient has hx of MS, falls consistently, at nursing home today patient fell on his face from his wheel chair while trying to transfer to bed, has laceration on his face on his right brow (patient is not on anticoagulants), no other injuries noted but states his bottom is sore,  VS from EMS:  HR: 90, BP: 130 (palpated), RR: 18, spO2: 98% on room air.   Also has laceration on palms from wheelchair, however these seem old and partially healed.

## 2013-10-16 NOTE — ED Notes (Signed)
Patient asked for and received a Happy Meal and a Sprite. 

## 2013-10-16 NOTE — ED Provider Notes (Signed)
Medical screening examination/treatment/procedure(s) were conducted as a shared visit with non-physician practitioner(s) and myself.  I personally evaluated the patient during the encounter.   EKG Interpretation None       Doug SouSam Javontae Marlette, MD 10/16/13 2051

## 2013-10-16 NOTE — ED Notes (Signed)
Patient asked for and received a Sprite.  

## 2013-10-16 NOTE — ED Provider Notes (Signed)
CSN: [id number]     Arrival date & time 10/16/13  1403 History   First MD Initiated Contact with Patient 10/16/13 1412     Chief Complaint  Patient presents with  . Fall  . Head Laceration    right brow     (Consider location/radiation/quality/duration/timing/severity/associated sxs/prior Treatment) HPI Comments: Patient is 63 year old male with history of MS and frequent falls who resides at a nursing home.  He states that he was trying to transfer from his wheelchair to his bed when he fell forward striking above his right eye.  He denies LOC.  He reports mild headache, but denies nausea, vomiting, neck pain or stiffness, injury to any other body part.  He is noted with a 2cm hemostatic laceration above his right eye.  Patient is a 63 y.o. male presenting with fall and scalp laceration. The history is provided by the patient. No language interpreter was used.  Fall This is a recurrent problem. The current episode started today. The problem occurs constantly. The problem has been unchanged. Associated symptoms include weakness. Pertinent negatives include no abdominal pain, arthralgias, chest pain, congestion, coughing, fatigue, fever, headaches, nausea, neck pain, numbness, sore throat, vertigo, visual change or vomiting. Nothing aggravates the symptoms. He has tried nothing for the symptoms. The treatment provided no relief.  Head Laceration Associated symptoms include weakness. Pertinent negatives include no abdominal pain, arthralgias, chest pain, congestion, coughing, fatigue, fever, headaches, nausea, neck pain, numbness, sore throat, vertigo, visual change or vomiting.    Past Medical History  Diagnosis Date  . Hypertension   . Duodenal ulcer hemorrhage   . Hypertrophy of prostate   . Generalized muscle weakness   . Acute respiratory failure   . Cognitive communication deficit   . MRSA (methicillin resistant Staphylococcus aureus)   . Kidney stone    Past Surgical History   Procedure Laterality Date  . Knee arthroscopy      right  . Total knee arthroplasty      right  . Wrist surgery      right  . Neck surgery    . Exploratory laparotomy  02/2011    Duodenotomy, oversew of gastric ulcer/duodenal artery  . Appendectomy    . Tibia fracture surgery    . Olecranon bursectomy Left 09/08/2012    Procedure: OLECRANON BURSA;  Surgeon: Velna OchsPeter G Dalldorf, MD;  Location: [facility];  Service: Orthopedics;  Laterality: Left;  LEFT  ELBOW  OLECRANON  BURSA  INCISION   Family History  Problem Relation Age of Onset  . Hypertension Father    History  Substance Use Topics  . Smoking status: Former Smoker -- 40 years    Quit date: 03/08/2011  . Smokeless tobacco: Never Used  . Alcohol Use: No     Comment: states he stopped 2 yrs ago    Review of Systems  Constitutional: Negative for fever and fatigue.  HENT: Negative for congestion and sore throat.   Respiratory: Negative for cough.   Cardiovascular: Negative for chest pain.  Gastrointestinal: Negative for nausea, vomiting and abdominal pain.  Musculoskeletal: Negative for arthralgias and neck pain.  Neurological: Positive for weakness. Negative for vertigo, numbness and headaches.  All other systems reviewed and are negative.     Allergies  Aspirin and Ibuprofen  Home Medications   Prior to Admission medications   Medication Sig Start Date End Date Taking? Authorizing Provider  acetaminophen (TYLENOL) 325 MG tablet Take by mouth every 6 (six) hours as needed for  mild pain.   Yes Historical Provider, MD  docusate sodium (COLACE) 100 MG capsule Take 100 mg by mouth at bedtime.    Yes Historical Provider, MD  HYDROcodone-acetaminophen (NORCO/VICODIN) 5-325 MG per tablet Take 1 tablet by mouth every 6 (six) hours.   Yes Historical Provider, MD  lisinopril (PRINIVIL,ZESTRIL) 10 MG tablet Take 10 mg by mouth every morning.    Yes Historical Provider, MD  magnesium oxide (MAG-OX) 400 MG tablet Take 400 mg by  mouth at bedtime.    Yes Historical Provider, MD  Multiple Vitamin (MULTIVITAMIN WITH MINERALS) TABS Take 2 tablets by mouth every morning.    Yes Historical Provider, MD  omeprazole (PRILOSEC) 20 MG capsule Take 20 mg by mouth every morning.    Yes Historical Provider, MD  rOPINIRole (REQUIP) 0.25 MG tablet Take 0.25 mg by mouth at bedtime.   Yes Historical Provider, MD  senna (SENOKOT) 8.6 MG TABS Take 1 tablet by mouth at bedtime.    Yes Historical Provider, MD  sertraline (ZOLOFT) 100 MG tablet Take 100 mg by mouth at bedtime.   Yes Historical Provider, MD  traZODone (DESYREL) 50 MG tablet Take 125 mg by mouth at bedtime.   Yes Historical Provider, MD  loratadine (CLARITIN) 10 MG tablet Take 10 mg by mouth daily as needed for allergies.    Historical Provider, MD   BP 141/84  Pulse 71  Temp(Src) 98 F (36.7 C) (Oral)  Resp 14  Ht 6\' 6"  (1.981 m)  Wt 170 lb (77.111 kg)  BMI 19.65 kg/m2  SpO2 99% Physical Exam  Nursing note and vitals reviewed. Constitutional: He is oriented to person, place, and time. He appears well-developed and well-nourished. No distress.  HENT:  Head: Normocephalic.  Right Ear: External ear normal.  Left Ear: External ear normal.  Nose: Nose normal.  Mouth/Throat: Oropharynx is clear and moist. No oropharyngeal exudate.  2cm linear hemostatic laceration above the right eye.  Eyes: Conjunctivae are normal. Pupils are equal, round, and reactive to light. No scleral icterus.  Neck: Normal range of motion. Neck supple. No spinous process tenderness and no muscular tenderness present.  Cardiovascular: Normal rate, regular rhythm and normal heart sounds.  Exam reveals no gallop and no friction rub.   No murmur heard. Pulmonary/Chest: Effort normal and breath sounds normal. No respiratory distress. He has no wheezes. He has no rales. He exhibits no tenderness.  Abdominal: Soft. Bowel sounds are normal. He exhibits no distension. There is no tenderness. There is no  rebound and no guarding.  Musculoskeletal: Normal range of motion. He exhibits no edema and no tenderness.  Lymphadenopathy:    He has no cervical adenopathy.  Neurological: He is alert and oriented to person, place, and time. He exhibits normal muscle tone. Coordination normal.  Skin: Skin is warm and dry. No rash noted. No erythema. No pallor.  Psychiatric: He has a normal mood and affect. His behavior is normal. Judgment and thought content normal.    ED Course  Procedures (including critical care time) Labs Review Labs Reviewed - No data to display  Imaging Review No results found.   EKG Interpretation None     LACERATION REPAIR Performed by: Izola PriceFrances C. Jamar Casagrande Authorized by: Izola PriceFrances C. Seynabou Fults Consent: Verbal consent obtained. Risks and benefits: risks, benefits and alternatives were discussed Consent given by: patient Patient identity confirmed: provided demographic data Prepped and Draped in normal sterile fashion Wound explored  Laceration Location: right eyebrow   Laceration Length: 2cm  No  Foreign Bodies seen or palpated  Anesthesia: local infiltration  Local anesthetic: lidocaine 2% without epinephrine  Anesthetic total: 4 ml  Irrigation method: syringe Amount of cleaning: standard  Skin closure: 5.0 nylon  Number of sutures: 4  Technique: simple interrupted  Patient tolerance: Patient tolerated the procedure well with no immediate complications. MDM   Minor head injury Right eyebrow laceration   Patient with history of MS and frequent falls presents to the ED after mechanical fall from wheelchair.  There was no LOC and he not on anti-coagulation so I do not feel that imaging is warranted at this time.  Wound sutured and tetanus updated.    Izola Price Marisue Humble, PA-C 10/16/13 1653

## 2013-10-31 ENCOUNTER — Encounter (HOSPITAL_BASED_OUTPATIENT_CLINIC_OR_DEPARTMENT_OTHER): Payer: Medicaid Other | Attending: General Surgery

## 2013-10-31 DIAGNOSIS — L8990 Pressure ulcer of unspecified site, unspecified stage: Secondary | ICD-10-CM | POA: Insufficient documentation

## 2013-10-31 DIAGNOSIS — L8992 Pressure ulcer of unspecified site, stage 2: Secondary | ICD-10-CM | POA: Insufficient documentation

## 2013-10-31 DIAGNOSIS — L89899 Pressure ulcer of other site, unspecified stage: Secondary | ICD-10-CM | POA: Insufficient documentation

## 2013-10-31 DIAGNOSIS — L89109 Pressure ulcer of unspecified part of back, unspecified stage: Secondary | ICD-10-CM | POA: Insufficient documentation

## 2013-11-06 ENCOUNTER — Emergency Department (HOSPITAL_COMMUNITY)
Admission: EM | Admit: 2013-11-06 | Discharge: 2013-11-06 | Disposition: A | Discharge status: Home / Self Care | Payer: Medicaid Other | Attending: Emergency Medicine | Admitting: Emergency Medicine

## 2013-11-06 ENCOUNTER — Emergency Department (HOSPITAL_COMMUNITY): Payer: Medicaid Other

## 2013-11-06 ENCOUNTER — Encounter (HOSPITAL_COMMUNITY): Payer: Self-pay | Admitting: Emergency Medicine

## 2013-11-06 DIAGNOSIS — Z87442 Personal history of urinary calculi: Secondary | ICD-10-CM | POA: Insufficient documentation

## 2013-11-06 DIAGNOSIS — I10 Essential (primary) hypertension: Secondary | ICD-10-CM | POA: Insufficient documentation

## 2013-11-06 DIAGNOSIS — Z79899 Other long term (current) drug therapy: Secondary | ICD-10-CM | POA: Insufficient documentation

## 2013-11-06 DIAGNOSIS — Z8659 Personal history of other mental and behavioral disorders: Secondary | ICD-10-CM | POA: Insufficient documentation

## 2013-11-06 DIAGNOSIS — Z8719 Personal history of other diseases of the digestive system: Secondary | ICD-10-CM | POA: Insufficient documentation

## 2013-11-06 DIAGNOSIS — L8993 Pressure ulcer of unspecified site, stage 3: Secondary | ICD-10-CM | POA: Insufficient documentation

## 2013-11-06 DIAGNOSIS — M7020 Olecranon bursitis, unspecified elbow: Secondary | ICD-10-CM | POA: Insufficient documentation

## 2013-11-06 DIAGNOSIS — L89109 Pressure ulcer of unspecified part of back, unspecified stage: Secondary | ICD-10-CM | POA: Insufficient documentation

## 2013-11-06 DIAGNOSIS — L8990 Pressure ulcer of unspecified site, unspecified stage: Secondary | ICD-10-CM

## 2013-11-06 DIAGNOSIS — Z87448 Personal history of other diseases of urinary system: Secondary | ICD-10-CM | POA: Insufficient documentation

## 2013-11-06 DIAGNOSIS — T148XXA Other injury of unspecified body region, initial encounter: Secondary | ICD-10-CM

## 2013-11-06 DIAGNOSIS — M1A00X1 Idiopathic chronic gout, unspecified site, with tophus (tophi): Secondary | ICD-10-CM | POA: Insufficient documentation

## 2013-11-06 DIAGNOSIS — Z87891 Personal history of nicotine dependence: Secondary | ICD-10-CM | POA: Insufficient documentation

## 2013-11-06 DIAGNOSIS — Z8709 Personal history of other diseases of the respiratory system: Secondary | ICD-10-CM | POA: Insufficient documentation

## 2013-11-06 DIAGNOSIS — Z8614 Personal history of Methicillin resistant Staphylococcus aureus infection: Secondary | ICD-10-CM | POA: Insufficient documentation

## 2013-11-06 LAB — BASIC METABOLIC PANEL
BUN: 15 mg/dL (ref 6–23)
CALCIUM: 9.4 mg/dL (ref 8.4–10.5)
CO2: 32 mEq/L (ref 19–32)
CREATININE: 0.77 mg/dL (ref 0.50–1.35)
Chloride: 104 mEq/L (ref 96–112)
GFR calc Af Amer: >90 mL/min (ref >90)
GFR calc non Af Amer: >90 mL/min (ref >90)
Glucose, Bld: 97 mg/dL (ref 70–99)
Potassium: 4 mEq/L (ref 3.7–5.3)
Sodium: 142 mEq/L (ref 137–147)

## 2013-11-06 LAB — CBC WITH DIFFERENTIAL/PLATELET
Basophils Absolute: 0 10*3/uL (ref 0.0–0.1)
Basophils Relative: 0 % (ref 0–1)
EOS PCT: 0 % (ref 0–5)
Eosinophils Absolute: 0 10*3/uL (ref 0.0–0.7)
HEMATOCRIT: 39.7 % (ref 39.0–52.0)
Hemoglobin: 13.6 g/dL (ref 13.0–17.0)
LYMPHS PCT: 17 % (ref 12–46)
Lymphs Abs: 1.5 10*3/uL (ref 0.7–4.0)
MCH: 33.2 pg (ref 26.0–34.0)
MCHC: 34.3 g/dL (ref 30.0–36.0)
MCV: 96.8 fL (ref 78.0–100.0)
MONO ABS: 0.8 10*3/uL (ref 0.1–1.0)
Monocytes Relative: 9 % (ref 3–12)
Neutro Abs: 6.5 10*3/uL (ref 1.7–7.7)
Neutrophils Relative %: 74 % (ref 43–77)
Platelets: 227 10*3/uL (ref 150–400)
RBC: 4.1 MIL/uL — ABNORMAL LOW (ref 4.22–5.81)
RDW: 12.3 % (ref 11.5–15.5)
WBC: 8.8 10*3/uL (ref 4.0–10.5)

## 2013-11-06 MED ORDER — HYDROCODONE-ACETAMINOPHEN 5-325 MG PO TABS: 1.0000 | ORAL_TABLET | Freq: Four times a day (QID) | ORAL | Status: AC | PRN

## 2013-11-06 MED ORDER — HYDROCODONE-ACETAMINOPHEN 5-325 MG PO TABS
2.0000 | ORAL_TABLET | Freq: Once | ORAL | Status: AC
  Administered 2013-11-06: 2 via ORAL
  Filled 2013-11-06: qty 2

## 2013-11-06 NOTE — ED Notes (Signed)
Pt from SNF, he has a large blister on the palm of his right hand, looks like a calus, he states he got it from his wheelchair, pt also has a draining abcess on his left elbow and he states that he has a sore on his tailbone

## 2013-11-06 NOTE — ED Provider Notes (Signed)
CSN: [id number]     Arrival date & time 11/06/13  [id number] History   First MD Initiated Contact with Patient 11/06/13 0559     Chief Complaint  Patient presents with  . Skin Ulcer     (Consider location/radiation/quality/duration/timing/severity/associated sxs/prior Treatment) HPI Comments: Patient presents to the ED with a chief complaint of right hand pain.  He states that he developed a large blister from pushing his wheel chair on Friday.  He states that it has progressively worsened.  He also complains of left elbow pain, and has a large tophi.  Additionally, he complains of a bed sore on his tailbone.  He states that he is in moderate pain.  He has not taken anything to alleviate his symptoms.  There are no aggravating or alleviating factors.  The history is provided by the patient. No language interpreter was used.    Past Medical History  Diagnosis Date  . Hypertension   . Duodenal ulcer hemorrhage   . Hypertrophy of prostate   . Generalized muscle weakness   . Acute respiratory failure   . Cognitive communication deficit   . MRSA (methicillin resistant Staphylococcus aureus)   . Kidney stone    Past Surgical History  Procedure Laterality Date  . Knee arthroscopy      right  . Total knee arthroplasty      right  . Wrist surgery      right  . Neck surgery    . Exploratory laparotomy  02/2011    Duodenotomy, oversew of gastric ulcer/duodenal artery  . Appendectomy    . Tibia fracture surgery    . Olecranon bursectomy Left 09/08/2012    Procedure: OLECRANON BURSA;  Surgeon: Velna OchsPeter G Dalldorf, MD;  Location: [facility];  Service: Orthopedics;  Laterality: Left;  LEFT  ELBOW  OLECRANON  BURSA  INCISION   Family History  Problem Relation Age of Onset  . Hypertension Father    History  Substance Use Topics  . Smoking status: Former Smoker -- 40 years    Quit date: 03/08/2011  . Smokeless tobacco: Never Used  . Alcohol Use: No     Comment: states he stopped 2 yrs ago     Review of Systems  Constitutional: Negative for fever and chills.  Respiratory: Negative for shortness of breath.   Cardiovascular: Negative for chest pain.  Gastrointestinal: Negative for nausea, vomiting, diarrhea and constipation.  Genitourinary: Negative for dysuria.  Musculoskeletal: Positive for arthralgias.  Skin: Positive for wound.      Allergies  Aspirin and Ibuprofen  Home Medications   Prior to Admission medications   Medication Sig Start Date End Date Taking? Authorizing Provider  acetaminophen (TYLENOL) 325 MG tablet Take by mouth every 6 (six) hours as needed for mild pain.   Yes Historical Provider, MD  docusate sodium (COLACE) 100 MG capsule Take 100 mg by mouth at bedtime.    Yes Historical Provider, MD  HYDROcodone-acetaminophen (NORCO/VICODIN) 5-325 MG per tablet Take 1 tablet by mouth 3 (three) times daily. 6am,12pm,bedtime   Yes Historical Provider, MD  lisinopril (PRINIVIL,ZESTRIL) 10 MG tablet Take 10 mg by mouth every morning.    Yes Historical Provider, MD  loperamide (IMODIUM A-D) 2 MG tablet Take 2 mg by mouth 4 (four) times daily as needed for diarrhea or loose stools (after each loose stools   max 3 doses).   Yes Historical Provider, MD  loratadine (CLARITIN) 10 MG tablet Take 10 mg by mouth daily as needed for allergies.  Yes Historical Provider, MD  magnesium hydroxide (MILK OF MAGNESIA) 400 MG/5ML suspension Take 15 mLs by mouth daily as needed for mild constipation (constipation).   Yes Historical Provider, MD  magnesium oxide (MAG-OX) 400 MG tablet Take 400 mg by mouth at bedtime.    Yes Historical Provider, MD  Multiple Vitamin (MULTIVITAMIN WITH MINERALS) TABS Take 2 tablets by mouth every morning.    Yes Historical Provider, MD  omeprazole (PRILOSEC) 20 MG capsule Take 20 mg by mouth every morning.    Yes Historical Provider, MD  rOPINIRole (REQUIP) 0.25 MG tablet Take 0.25 mg by mouth at bedtime.   Yes Historical Provider, MD  senna  (SENOKOT) 8.6 MG TABS Take 1 tablet by mouth at bedtime.    Yes Historical Provider, MD  sertraline (ZOLOFT) 100 MG tablet Take 100 mg by mouth at bedtime.   Yes Historical Provider, MD  traZODone (DESYREL) 50 MG tablet Take 125 mg by mouth at bedtime. Takes 2 and 1/2 tablets   Yes Historical Provider, MD   BP 120/98  Pulse 68  Temp(Src) 98.1 F (36.7 C) (Oral)  Resp 18  SpO2 98% Physical Exam  Nursing note and vitals reviewed. Constitutional: He is oriented to person, place, and time. He appears well-developed and well-nourished.  HENT:  Head: Normocephalic and atraumatic.  Eyes: Conjunctivae and EOM are normal. Pupils are equal, round, and reactive to light. Right eye exhibits no discharge. Left eye exhibits no discharge. No scleral icterus.  Neck: Normal range of motion. Neck supple. No JVD present.  Cardiovascular: Normal rate, regular rhythm and normal heart sounds.  Exam reveals no gallop and no friction rub.   No murmur heard. Pulmonary/Chest: Effort normal and breath sounds normal. No respiratory distress. He has no wheezes. He has no rales. He exhibits no tenderness.  Abdominal: Soft. He exhibits no distension and no mass. There is no tenderness. There is no rebound and no guarding.  Musculoskeletal: Normal range of motion. He exhibits no edema and no tenderness.  Large gouty tophi of left elbow, no obvious discharge or drainage, no surrounding erythema or signs of septic joint  Right hand remarkable for large bulla on palmer aspect, no discharge or drainage  Neurological: He is alert and oriented to person, place, and time.  Skin: Skin is warm and dry.  Stage 3 decubitus ulcer of low back, approximately 2x2 cm  Psychiatric: He has a normal mood and affect. His behavior is normal. Judgment and thought content normal.    ED Course  Procedures (including critical care time) Results for orders placed during the hospital encounter of 11/06/13  CBC WITH DIFFERENTIAL      Result  Value Ref Range   WBC 8.8  4.0 - 10.5 K/uL   RBC 4.10 (*) 4.22 - 5.81 MIL/uL   Hemoglobin 13.6  13.0 - 17.0 g/dL   HCT 16.1  09.6 - 04.5 %   MCV 96.8  78.0 - 100.0 fL   MCH 33.2  26.0 - 34.0 pg   MCHC 34.3  30.0 - 36.0 g/dL   RDW 40.9  81.1 - 91.4 %   Platelets 227  150 - 400 K/uL   Neutrophils Relative % 74  43 - 77 %   Neutro Abs 6.5  1.7 - 7.7 K/uL   Lymphocytes Relative 17  12 - 46 %   Lymphs Abs 1.5  0.7 - 4.0 K/uL   Monocytes Relative 9  3 - 12 %   Monocytes Absolute 0.8  0.1 -  1.0 K/uL   Eosinophils Relative 0  0 - 5 %   Eosinophils Absolute 0.0  0.0 - 0.7 K/uL   Basophils Relative 0  0 - 1 %   Basophils Absolute 0.0  0.0 - 0.1 K/uL  BASIC METABOLIC PANEL      Result Value Ref Range   Sodium 142  137 - 147 mEq/L   Potassium 4.0  3.7 - 5.3 mEq/L   Chloride 104  96 - 112 mEq/L   CO2 32  19 - 32 mEq/L   Glucose, Bld 97  70 - 99 mg/dL   BUN 15  6 - 23 mg/dL   Creatinine, Ser 6.570.77  0.50 - 1.35 mg/dL   Calcium 9.4  8.4 - 84.610.5 mg/dL   GFR calc non Af Amer >90  >90 mL/min   GFR calc Af Amer >90  >90 mL/min   Dg Elbow Complete Left  11/06/2013   CLINICAL DATA:  Knot on the posterior left elbow. Number known trauma.  EXAM: LEFT ELBOW - COMPLETE 3+ VIEW  COMPARISON:  DG ELBOW COMPLETE*L* dated 09/02/2012  FINDINGS: Prominent olecranon spur are with overlying soft tissue swelling in the olecranon region likely representing bursitis. Degenerative changes in the left elbow. No evidence of acute fracture or dislocation. No significant effusion.  IMPRESSION: Prominent olecranon spur with overlying soft tissue swelling consistent with olecranon bursitis.   Electronically Signed   By: Burman NievesWilliam  Stevens M.D.   On: 11/06/2013 06:47   Dg Hip Complete Right  10/10/2013   CLINICAL DATA:  Pain post trauma  EXAM: RIGHT HIP - COMPLETE 2+ VIEW  COMPARISON:  June 25, 2013  FINDINGS: Frontal pelvis as well as frontal and lateral views of the right hip were obtained. There is moderate osteoarthritic  change in both hip joints, slightly more on the right than on the left, stable. There is no acute fracture or dislocation. No erosive change. There is arthropathy in the lower lumbar spine with levoscoliosis.  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 Osteoarthritic change in the visualized lumbar spine. No acute fracture or dislocation.   Electronically Signed   By: Bretta BangWilliam  Woodruff M.D.   On: 10/10/2013 07:24   Dg Hand Complete Right  11/06/2013   CLINICAL DATA:  Soft tissue swelling and skin ulcer along the palm of the right hand.  EXAM: RIGHT HAND - COMPLETE 3+ VIEW  COMPARISON:  None.  FINDINGS: Degenerative changes in the interphalangeal joints, first metacarpal phalangeal joint, and intercarpal joints. No evidence of acute fracture or dislocation. No radiopaque soft tissue foreign bodies. Soft tissue swelling suggested over the palm are aspect of the metacarpal phalangeal region.  IMPRESSION: Soft tissue swelling.  Degenerative changes.  No acute fractures.   Electronically Signed   By: Burman NievesWilliam  Stevens M.D.   On: 11/06/2013 06:46      EKG Interpretation None      MDM   Final diagnoses:  Olecranon bursitis  Blister  Decubitus ulcer    Patient with right hand and elbow pain.  Will check labs and imaging and reassess.  Imaging reassuring.  Discussed the patient with Dr. Silverio LayYao.  Will perform wound care on the patient.  Will wrap the right hand and left elbow.  No evidence of infection.  Patient is allergic to NSAIDs, and I do not want to give steroids which could delay healing of the patient's sacral decubitus ulcer.  I will give referral to wound care.  Recommend follow-up in the next day or two.  Recommend orthopedic follow-up for the left elbow bursitis.  This problem is not new.  F/u in 1 week.   Roxy Horseman, PA-C 11/06/13 [phone]

## 2013-11-06 NOTE — Telephone Encounter (Signed)
Close encounter 

## 2013-11-06 NOTE — Discharge Instructions (Signed)
Olecranon Bursitis Bursitis is swelling and soreness (inflammation) of a fluid-filled sac (bursa) that covers and protects a joint. Olecranon bursitis occurs over the elbow.  CAUSES Bursitis can be caused by injury, overuse of the joint, arthritis, or infection.  SYMPTOMS   Tenderness, swelling, warmth, or redness over the elbow.  Elbow pain with movement. This is greater with bending the elbow.  Squeaking sound when the bursa is rubbed or moved.  Increasing size of the bursa without pain or discomfort.  Fever with increasing pain and swelling if the bursa becomes infected. HOME CARE INSTRUCTIONS   Put ice on the affected area.  Put ice in a plastic bag.  Place a towel between your skin and the bag.  Leave the ice on for 15-20 minutes each hour while awake. Do this for the first 2 days.  When resting, elevate your elbow above the level of your heart. This helps reduce swelling.  Continue to put the joint through a full range of motion 4 times per day. Rest the injured joint at other times. When the pain lessens, begin normal slow movements and usual activities.  Only take over-the-counter or prescription medicines for pain, discomfort, or fever as directed by your caregiver.  Reduce your intake of milk and related dairy products (cheese, yogurt). They may make your condition worse. SEEK IMMEDIATE MEDICAL CARE IF:   Your pain increases even during treatment.  You have a fever.  You have heat and inflammation over the bursa and elbow.  You have a red line that goes up your arm.  You have pain with movement of your elbow. MAKE SURE YOU:   Understand these instructions.  Will watch your condition.  Will get help right away if you are not doing well or get worse. Document Released: 07/14/2006 Document Revised: 09/06/2011 Document Reviewed: 05/30/2007 [facility] Patient Information 2014 [place], [place]. Skin Ulcer A skin ulcer is an open sore that can be shallow or deep.  Skin ulcers sometimes become infected and are difficult to treat. It may be 1 month or longer before real healing progress is made. CAUSES   Injury.  Problems with the veins or arteries.  Diabetes.  Insect bites.  Bedsores.  Inflammatory conditions. SYMPTOMS   Pain, redness, swelling, and tenderness around the ulcer.  Fever.  Bleeding from the ulcer.  Yellow or clear fluid coming from the ulcer. DIAGNOSIS  There are many types of skin ulcers. Any open sores will be examined. Certain tests will be done to determine the kind of ulcer you have. The right treatment depends on the type of ulcer you have. TREATMENT  Treatment is a long-term challenge. It may include:  Wearing an elastic wrap, compression stockings, or gel cast over the ulcer area.  Taking antibiotic medicines or putting antibiotic creams on the affected area if there is an infection. HOME CARE INSTRUCTIONS  Put on your bandages (dressings), wraps, or casts over the ulcer as directed by your caregiver.  Change all dressings as directed by your caregiver.  Take all medicines as directed by your caregiver.  Keep the affected area clean and dry.  Avoid injuries to the affected area.  Eat a well-balanced, healthy diet that includes plenty of fruit and vegetables.  If you smoke, consider quitting or decreasing the amount of cigarettes you smoke.  Once the ulcer heals, get regular exercise as directed by your caregiver.  Work with your caregiver to make sure your blood pressure, cholesterol, and diabetes are well-controlled.  Keep your skin  moisturized. Dry skin can crack and lead to skin ulcers. SEEK IMMEDIATE MEDICAL CARE IF:   Your pain gets worse.  You have swelling, redness, or fluids around the ulcer.  You have chills.  You have a fever. MAKE SURE YOU:   Understand these instructions.  Will watch your condition.  Will get help right away if you are not doing well or get worse. Document  Released: 07/22/2004 Document Revised: 09/06/2011 Document Reviewed: 01/29/2011 [facility] Patient Information 2014 [place], [place].

## 2013-11-06 NOTE — ED Notes (Addendum)
Discharge instructions reviewed with [name] who denies questions regarding discharge instructions. [facility] called to transport [facility] back [facility].

## 2013-11-06 NOTE — ED Notes (Signed)
Provided pt with scrubs to wear home bc his clothes were wet.

## 2013-11-06 NOTE — ED Provider Notes (Signed)
Medical screening examination/treatment/procedure(s) were performed by non-physician practitioner and as supervising physician I was immediately available for consultation/collaboration.  Anam Bobby L Cornelius Marullo, MD 11/06/13 0913 

## 2013-11-07 ENCOUNTER — Encounter: Payer: Self-pay | Admitting: *Deleted

## 2013-11-08 ENCOUNTER — Ambulatory Visit (INDEPENDENT_AMBULATORY_CARE_PROVIDER_SITE_OTHER): Payer: Medicaid Other | Admitting: Neurology

## 2013-11-08 ENCOUNTER — Encounter: Payer: Self-pay | Admitting: Neurology

## 2013-11-08 VITALS — BP 123/69 | HR 77

## 2013-11-08 DIAGNOSIS — R5381 Other malaise: Secondary | ICD-10-CM

## 2013-11-08 DIAGNOSIS — R5383 Other fatigue: Secondary | ICD-10-CM

## 2013-11-08 DIAGNOSIS — R269 Unspecified abnormalities of gait and mobility: Secondary | ICD-10-CM

## 2013-11-08 DIAGNOSIS — R2681 Unsteadiness on feet: Secondary | ICD-10-CM

## 2013-11-08 DIAGNOSIS — R531 Weakness: Secondary | ICD-10-CM

## 2013-11-08 NOTE — Progress Notes (Signed)
[facility]    Provider:  Dr Hosie PoissonSumner Referring Provider: Marlowe Cook, [name], FNP Primary Care Physician:  No primary provider on file.  CC:  History of MS, frequent falls  HPI:  [name] is a 63 y.o. male here as a referral from Dr. Nehemiah Cook for history of MS and frequent falls. He currently is residing in an assisted living facility. He notes being diagnosed with MS around 5 years ago, unsure if he had a MRI of the head but knows that no LP was done. He was not started on any medication. He states he was [place]. He states his initial symptoms were dizziness, trouble walking, muscle stiffness and pain. He notes some difficulty with his vision, notes history of double vision and blurry vision. He notes history of transient unilateral weakness and sensory loss. He notes some difficulty swallowing, will sometimes choke. Some slurring of his speech noted. Notes a good appetite. CMA with patient notes a progressive decline, she notes increased weakness (mainly in a wheelchair), increased difficulty with eating, (has intention tremor) and difficulty swallowing. Progressive weakness and muscle wasting noted. He notes some twitching of his muscles. Memory is good overall.  The patient is a poor historian and the history is somewhat limited.   Review of Systems: Out of a complete 14 system review, the patient complains of only the following symptoms, and all other reviewed systems are negative. + weight loss, blurred vision, easy bruising, numbness, weakness, restless legs  History   Social History  . Marital Status: Legally Separated    Spouse Name: N/A    Number of Children: N/A  . Years of Education: N/A   Occupational History  . Not on file.   Social History Main Topics  . Smoking status: Current Every Day Smoker -- 40 years    Last Attempt to Quit: 03/08/2011  . Smokeless tobacco: Never Used  . Alcohol Use: No     Comment: states he  stopped 2 yrs ago  . Drug Use: No  . Sexual Activity: Not Currently   Other Topics Concern  . Not on file   Social History Narrative   Married, 3 children   Right handed   12 th grade   1 cup daily    Family History  Problem Relation Age of Onset  . Hypertension Father     Past Medical History  Diagnosis Date  . Duodenal ulcer hemorrhage   . Hypertrophy of prostate   . Generalized muscle weakness   . Acute respiratory failure   . Cognitive communication deficit   . MRSA (methicillin resistant Staphylococcus aureus)   . Kidney stone   . Hypertension     Past Surgical History  Procedure Laterality Date  . Knee arthroscopy      right  . Total knee arthroplasty      right  . Wrist surgery      right  . Neck surgery    . Exploratory laparotomy  02/2011    Duodenotomy, oversew of gastric ulcer/duodenal artery  . Appendectomy    . Tibia fracture surgery    . Olecranon bursectomy Left 09/08/2012    Procedure: OLECRANON BURSA;  Surgeon: Kevin OchsPeter G Dalldorf, MD;  Location: [facility];  Service: Orthopedics;  Laterality: Left;  LEFT  ELBOW  OLECRANON  BURSA  INCISION    Current Outpatient Prescriptions  Medication Sig Dispense Refill  . acetaminophen (TYLENOL) 325 MG tablet Take by mouth every 6 (six) hours  as needed for mild pain.      Marland Kitchen docusate sodium (COLACE) 100 MG capsule Take 100 mg by mouth at bedtime.       Marland Kitchen HYDROcodone-acetaminophen (NORCO/VICODIN) 5-325 MG per tablet Take 1 tablet by mouth 3 (three) times daily. 6am,12pm,bedtime      . HYDROcodone-acetaminophen (NORCO/VICODIN) 5-325 MG per tablet Take 1-2 tablets by mouth every 6 (six) hours as needed.  15 tablet  0  . lisinopril (PRINIVIL,ZESTRIL) 10 MG tablet Take 10 mg by mouth every morning.       . loperamide (IMODIUM A-D) 2 MG tablet Take 2 mg by mouth 4 (four) times daily as needed for diarrhea or loose stools (after each loose stools   max 3 doses).      . loratadine (CLARITIN) 10 MG tablet Take 10 mg by mouth  daily as needed for allergies.      . magnesium hydroxide (MILK OF MAGNESIA) 400 MG/5ML suspension Take 15 mLs by mouth daily as needed for mild constipation (constipation).      . magnesium oxide (MAG-OX) 400 MG tablet Take 400 mg by mouth at bedtime.       . Multiple Vitamin (MULTIVITAMIN WITH MINERALS) TABS Take 2 tablets by mouth every morning.       Marland Kitchen omeprazole (PRILOSEC) 20 MG capsule Take 20 mg by mouth every morning.       Marland Kitchen rOPINIRole (REQUIP) 0.25 MG tablet Take 0.25 mg by mouth at bedtime.      . senna (SENOKOT) 8.6 MG TABS Take 1 tablet by mouth at bedtime.       . sertraline (ZOLOFT) 100 MG tablet Take 100 mg by mouth at bedtime.      . traZODone (DESYREL) 50 MG tablet Take 125 mg by mouth at bedtime. Takes 2 and 1/2 tablets       No current facility-administered medications for this visit.    Allergies as of 11/08/2013 - Review Complete 11/08/2013  Allergen Reaction Noted  . Aspirin Rash 04/25/2012  . Ibuprofen Rash 04/25/2012    Vitals: BP 123/69  Pulse 77 Last Weight:  Wt Readings from Last 1 Encounters:  10/16/13 170 lb (77.111 kg)   Last Height:   Ht Readings from Last 1 Encounters:  10/16/13 6\' 6"  (1.981 m)     Physical exam: Exam: Gen: NAD, conversant, cachetic appearing Eyes: anicteric sclerae, moist conjunctivae HENT: Atraumatic, oropharynx clear Neck: Trachea midline; supple,  Lungs: CTA, no wheezing, rales, rhonic                          CV: RRR, no MRG Abdomen: Soft, non-tender;  Extremities: No peripheral edema  Skin: Normal temperature, no rash, multiple skin lesions, large ulceration on right hand Psych: Appropriate affect, pleasant  Neuro: MS: AA&Ox3, appropriately interactive, normal affect   Speech: fluent w/o paraphasic error  Memory: good recent and remote recall  CN: PERRL, EOMI no nystagmus, mild ptosis right eye, sensation intact to LT V1-V3 bilat, face symmetric, no weakness, hearing grossly intact, palate elevates  symmetrically, shoulder shrug 5/5 bilat,  tongue protrudes midline, no fasiculations noted.  Motor: decreased bulk and normal tone, questionable tongue fasciculations, intermittent fasciculations noted in right and left hamstrings Strength: 5-/5 bilateral UE proximal muscles, 5-/5 distal bilateral UE, 5-/5 bilateral proximal LE, 5/5 distal  Coord: no resting tremor noted, mild intention tremor bilaterally, mild choreiform movements of bilateral hands  Reflexes: diminished withabsent achilles bilat, bilat downgoing toes  Sens:  LT intact in all extremities  Gait: wheelchair bound, unable to stand without assistance   Assessment:  After physical and neurologic examination, review of laboratory studies, imaging, neurophysiology testing and pre-existing records, assessment will be reviewed on the problem list.  Plan:  Treatment plan and additional workup will be reviewed under Problem List.  1)Weakness 2)Progressive decline, cachexia 3)Gait instability  62y/o gentleman presenting for initial evaluation of progressive decline, gait instability. Carries an outside diagnosis of MS but unclear what workup has been completed. Will order MRI brain to check for signs of MS, pending results would consider LP and starting patient on treatment. History and clinical exam findings concerning for a possible neuromuscular disorder. Will check lab work and EMG/NCS. Follow up once workup completed.    Elspeth ChoPeter Dawayne Ohair, DO  [facility] [address] [place], [place] [id number]  Phone [phone] Fax [phone]

## 2013-11-09 LAB — TSH: TSH: 1.88 u[IU]/mL (ref 0.450–4.500)

## 2013-11-09 LAB — CK: CK TOTAL: 461 U/L — AB (ref 24–204)

## 2013-11-09 LAB — VITAMIN B12: Vitamin B-12: 702 pg/mL (ref 211–946)

## 2013-11-09 LAB — HIV ANTIBODY (ROUTINE TESTING W REFLEX)
HIV 1/HIV 2 AB: NONREACTIVE
HIV 1/O/2 Abs-Index Value: <1 (ref <1.00)

## 2013-11-14 ENCOUNTER — Other Ambulatory Visit: Payer: Medicare Other

## 2013-11-22 ENCOUNTER — Encounter: Payer: Medicare Other | Admitting: Neurology

## 2013-11-22 ENCOUNTER — Telehealth: Payer: Self-pay | Admitting: Neurology

## 2013-11-22 NOTE — Telephone Encounter (Signed)
This patient did not show for an EMG appointment today. 

## 2013-11-28 ENCOUNTER — Encounter (HOSPITAL_BASED_OUTPATIENT_CLINIC_OR_DEPARTMENT_OTHER): Payer: Medicaid Other | Attending: General Surgery

## 2013-11-28 ENCOUNTER — Ambulatory Visit: Admission: RE | Admit: 2013-11-28 | Payer: Medicare Other | Source: Ambulatory Visit

## 2013-11-28 DIAGNOSIS — L89109 Pressure ulcer of unspecified part of back, unspecified stage: Secondary | ICD-10-CM | POA: Insufficient documentation

## 2013-11-28 DIAGNOSIS — L8992 Pressure ulcer of unspecified site, stage 2: Secondary | ICD-10-CM | POA: Insufficient documentation

## 2013-12-14 DIAGNOSIS — L8992 Pressure ulcer of unspecified site, stage 2: Secondary | ICD-10-CM | POA: Diagnosis not present

## 2013-12-14 DIAGNOSIS — L89109 Pressure ulcer of unspecified part of back, unspecified stage: Secondary | ICD-10-CM | POA: Diagnosis not present

## 2014-01-02 ENCOUNTER — Encounter (HOSPITAL_BASED_OUTPATIENT_CLINIC_OR_DEPARTMENT_OTHER): Payer: Medicare Other | Attending: General Surgery

## 2014-05-15 ENCOUNTER — Encounter: Payer: Self-pay | Admitting: Neurology

## 2014-05-21 ENCOUNTER — Encounter: Payer: Self-pay | Admitting: Neurology

## 2014-09-28 IMAGING — CR DG SACRUM/COCCYX 2+V
3 series · 3 of 3 positions shown · non-contrast
Comparison: No priors.

CLINICAL DATA: History of fall with pain in the region of the
coccyx.

SACRUM AND COCCYX - 2+ VIEW

[t sacrum ap]
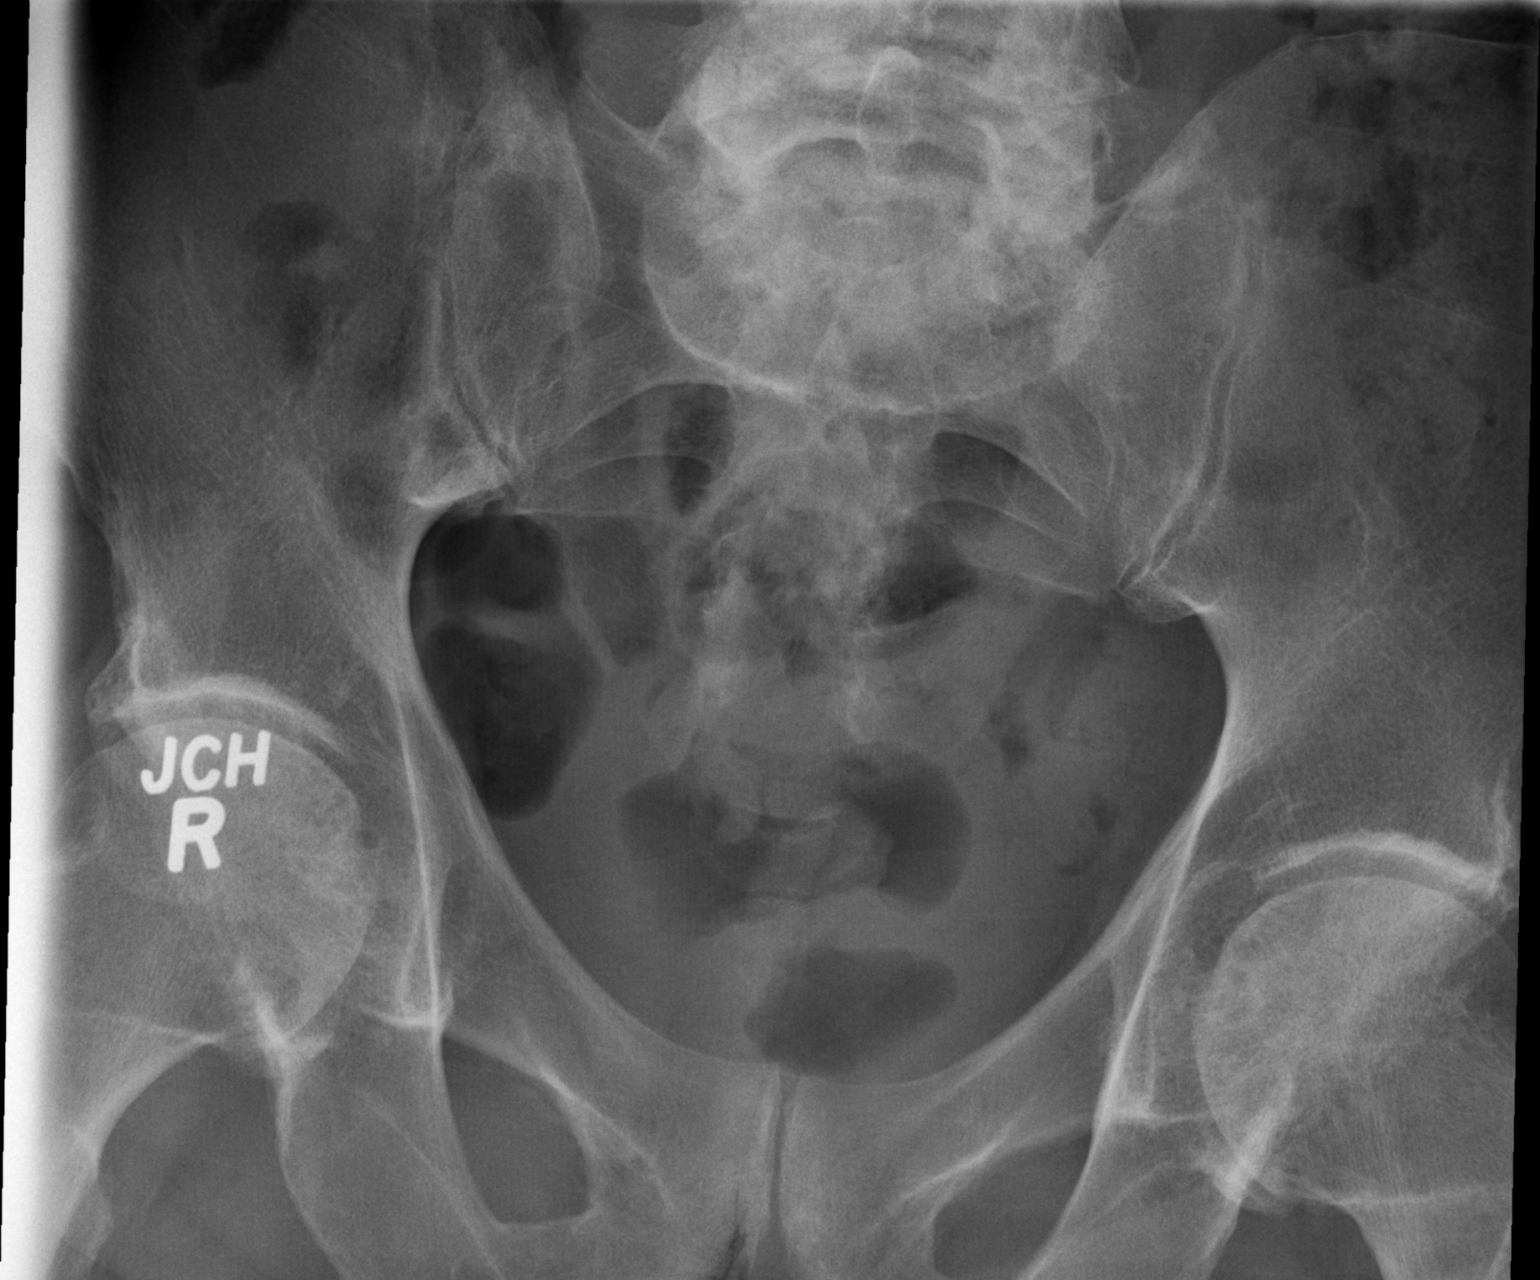

[t coccyx ap]
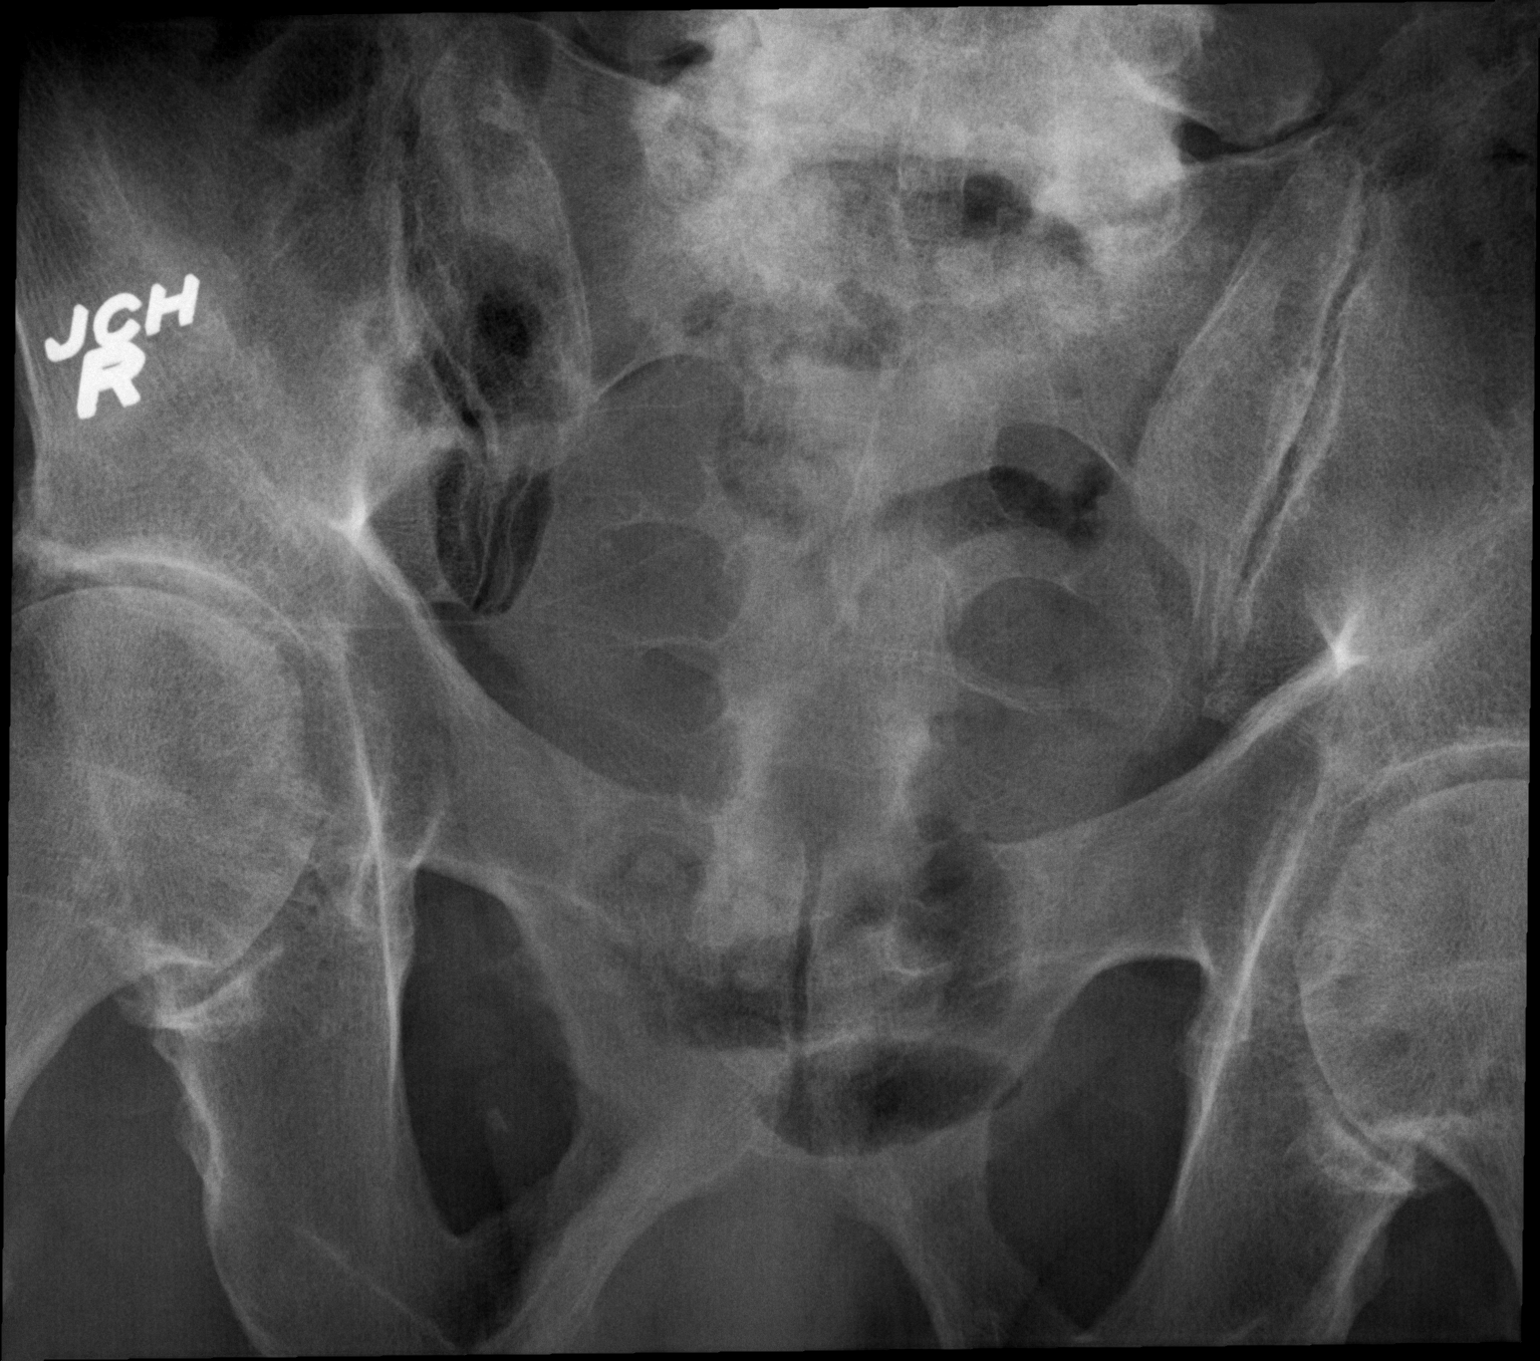

[t sacrum coccyx lat]
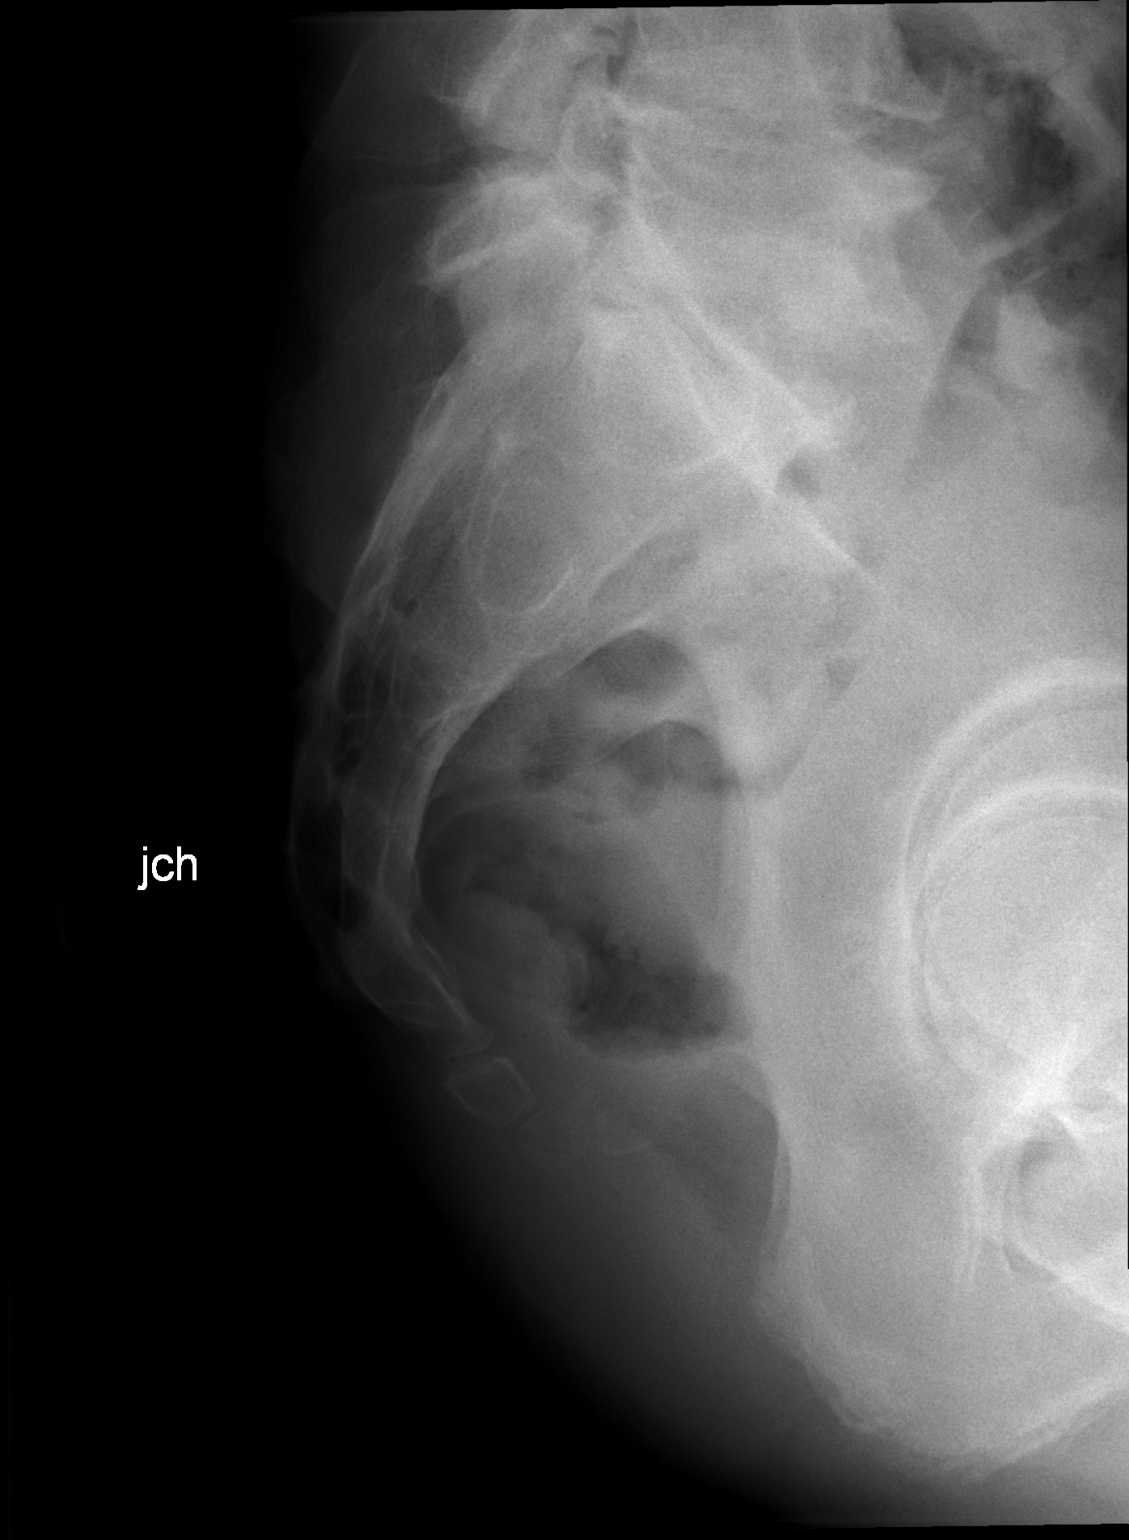

[3 of 3 positions shown; findings below may reference images not displayed]

FINDINGS: AP and lateral views of the sacrum and coccyx demonstrate
no definite acute displaced fractures.  Visualized portions of the
remaining pelvis also show no acute displaced fractures, but
demonstrates moderate to severe bilateral hip joint osteoarthritis.
IMPRESSION: 1.  No acute radiographic abnormality of the sacrum or coccyx.

## 2014-11-23 ENCOUNTER — Encounter (HOSPITAL_COMMUNITY): Payer: Self-pay | Admitting: Emergency Medicine

## 2014-11-23 ENCOUNTER — Emergency Department (HOSPITAL_COMMUNITY)
Admission: EM | Admit: 2014-11-23 | Discharge: 2014-11-23 | Disposition: A | Discharge status: Home / Self Care | Payer: Medicaid Other | Attending: Emergency Medicine | Admitting: Emergency Medicine

## 2014-11-23 DIAGNOSIS — Z8709 Personal history of other diseases of the respiratory system: Secondary | ICD-10-CM | POA: Insufficient documentation

## 2014-11-23 DIAGNOSIS — Z79899 Other long term (current) drug therapy: Secondary | ICD-10-CM | POA: Insufficient documentation

## 2014-11-23 DIAGNOSIS — W050XXA Fall from non-moving wheelchair, initial encounter: Secondary | ICD-10-CM | POA: Insufficient documentation

## 2014-11-23 DIAGNOSIS — I10 Essential (primary) hypertension: Secondary | ICD-10-CM | POA: Insufficient documentation

## 2014-11-23 DIAGNOSIS — S01111A Laceration without foreign body of right eyelid and periocular area, initial encounter: Secondary | ICD-10-CM | POA: Diagnosis not present

## 2014-11-23 DIAGNOSIS — Z87442 Personal history of urinary calculi: Secondary | ICD-10-CM | POA: Diagnosis not present

## 2014-11-23 DIAGNOSIS — S0990XA Unspecified injury of head, initial encounter: Secondary | ICD-10-CM | POA: Diagnosis not present

## 2014-11-23 DIAGNOSIS — Y92128 Other place in nursing home as the place of occurrence of the external cause: Secondary | ICD-10-CM | POA: Insufficient documentation

## 2014-11-23 DIAGNOSIS — Y998 Other external cause status: Secondary | ICD-10-CM | POA: Diagnosis not present

## 2014-11-23 DIAGNOSIS — Z8719 Personal history of other diseases of the digestive system: Secondary | ICD-10-CM | POA: Insufficient documentation

## 2014-11-23 DIAGNOSIS — Z720 Tobacco use: Secondary | ICD-10-CM | POA: Insufficient documentation

## 2014-11-23 DIAGNOSIS — S0181XA Laceration without foreign body of other part of head, initial encounter: Secondary | ICD-10-CM

## 2014-11-23 DIAGNOSIS — Z8614 Personal history of Methicillin resistant Staphylococcus aureus infection: Secondary | ICD-10-CM | POA: Insufficient documentation

## 2014-11-23 DIAGNOSIS — Y9389 Activity, other specified: Secondary | ICD-10-CM | POA: Insufficient documentation

## 2014-11-23 MED ORDER — HYDROCODONE-ACETAMINOPHEN 5-325 MG PO TABS
1.0000 | ORAL_TABLET | Freq: Once | ORAL | Status: AC
  Administered 2014-11-23: 1 via ORAL
  Filled 2014-11-23: qty 1

## 2014-11-23 MED ORDER — LIDOCAINE HCL (PF) 1 % IJ SOLN
INTRAMUSCULAR | Status: AC
Start: 2014-11-23 — End: 2014-11-23
  Administered 2014-11-23: 15:00:00
  Filled 2014-11-23: qty 5

## 2014-11-23 MED ORDER — LORAZEPAM 2 MG/ML IJ SOLN
1.0000 mg | Freq: Once | INTRAMUSCULAR | Status: AC
  Administered 2014-11-23: 1 mg via INTRAMUSCULAR
  Filled 2014-11-23: qty 1

## 2014-11-23 NOTE — ED Notes (Signed)
Pt placed back into bed so lac may be sutured

## 2014-11-23 NOTE — ED Notes (Signed)
Per [facility] RN, placed pt in wheelchair for comfort. Pt unable to tolerate lying in the gurney

## 2014-11-23 NOTE — Discharge Instructions (Signed)
Facial Laceration ° A facial laceration is a cut on the face. These injuries can be painful and cause bleeding. Lacerations usually heal quickly, but they need special care to reduce scarring. °DIAGNOSIS  °Your health care provider will take a medical history, ask for details about how the injury occurred, and examine the wound to determine how deep the cut is. °TREATMENT  °Some facial lacerations may not require closure. Others may not be able to be closed because of an increased risk of infection. The risk of infection and the chance for successful closure will depend on various factors, including the amount of time since the injury occurred. °The wound may be cleaned to help prevent infection. If closure is appropriate, pain medicines may be given if needed. Your health care provider will use stitches (sutures), wound glue (adhesive), or skin adhesive strips to repair the laceration. These tools bring the skin edges together to allow for faster healing and a better cosmetic outcome. If needed, you may also be given a tetanus shot. °HOME CARE INSTRUCTIONS °· Only take over-the-counter or prescription medicines as directed by your health care provider. °· Follow your health care provider's instructions for wound care. These instructions will vary depending on the technique used for closing the wound. °For Sutures: °· Keep the wound clean and dry.   °· If you were given a bandage (dressing), you should change it at least once a day. Also change the dressing if it becomes wet or dirty, or as directed by your health care provider.   °· Wash the wound with soap and water 2 times a day. Rinse the wound off with water to remove all soap. Pat the wound dry with a clean towel.   °· After cleaning, apply a thin layer of the antibiotic ointment recommended by your health care provider. This will help prevent infection and keep the dressing from sticking.   °· You may shower as usual after the first 24 hours. Do not soak the  wound in water until the sutures are removed.   °· Get your sutures removed as directed by your health care provider. With facial lacerations, sutures should usually be taken out after 4-5 days to avoid stitch marks.   °· Wait a few days after your sutures are removed before applying any makeup. °For Skin Adhesive Strips: °· Keep the wound clean and dry.   °· Do not get the skin adhesive strips wet. You may bathe carefully, using caution to keep the wound dry.   °· If the wound gets wet, pat it dry with a clean towel.   °· Skin adhesive strips will fall off on their own. You may trim the strips as the wound heals. Do not remove skin adhesive strips that are still stuck to the wound. They will fall off in time.   °For Wound Adhesive: °· You may briefly wet your wound in the shower or bath. Do not soak or scrub the wound. Do not swim. Avoid periods of heavy sweating until the skin adhesive has fallen off on its own. After showering or bathing, gently pat the wound dry with a clean towel.   °· Do not apply liquid medicine, cream medicine, ointment medicine, or makeup to your wound while the skin adhesive is in place. This may loosen the film before your wound is healed.   °· If a dressing is placed over the wound, be careful not to apply tape directly over the skin adhesive. This may cause the adhesive to be pulled off before the wound is healed.   °· Avoid   prolonged exposure to sunlight or tanning lamps while the skin adhesive is in place. °· The skin adhesive will usually remain in place for 5-10 days, then naturally fall off the skin. Do not pick at the adhesive film.   °After Healing: °Once the wound has healed, cover the wound with sunscreen during the day for 1 full year. This can help minimize scarring. Exposure to ultraviolet light in the first year will darken the scar. It can take 1-2 years for the scar to lose its redness and to heal completely.  °SEEK IMMEDIATE MEDICAL CARE IF: °· You have redness, pain, or  swelling around the wound.   °· You see a yellowish-white fluid (pus) coming from the wound.   °· You have chills or a fever.   °MAKE SURE YOU: °· Understand these instructions. °· Will watch your condition. °· Will get help right away if you are not doing well or get worse. °Document Released: 07/22/2004 Document Revised: 04/04/2013 Document Reviewed: 01/25/2013 °ExitCare® Patient Information ©2015 [employer]. This information is not intended to replace advice given to you by your health care provider. Make sure you discuss any questions you have with your health care provider. °Head Injury °You have received a head injury. It does not appear serious at this time. Headaches and vomiting are common following head injury. It should be easy to awaken from sleeping. Sometimes it is necessary for you to stay in the emergency department for a while for observation. Sometimes admission to the hospital may be needed. After injuries such as yours, most problems occur within the first 24 hours, but side effects may occur up to 7-10 days after the injury. It is important for you to carefully monitor your condition and contact your health care provider or seek immediate medical care if there is a change in your condition. °WHAT ARE THE TYPES OF HEAD INJURIES? °Head injuries can be as minor as a bump. Some head injuries can be more severe. More severe head injuries include: °· A jarring injury to the brain (concussion). °· A bruise of the brain (contusion). This mean there is bleeding in the brain that can cause swelling. °· A cracked skull (skull fracture). °· Bleeding in the brain that collects, clots, and forms a bump (hematoma). °WHAT CAUSES A HEAD INJURY? °A serious head injury is most likely to happen to someone who is in a car wreck and is not wearing a seat belt. Other causes of major head injuries include bicycle or motorcycle accidents, sports injuries, and falls. °HOW ARE HEAD INJURIES DIAGNOSED? °A complete  history of the event leading to the injury and your current symptoms will be helpful in diagnosing head injuries. Many times, pictures of the brain, such as CT or MRI are needed to see the extent of the injury. Often, an overnight hospital stay is necessary for observation.  °WHEN SHOULD I SEEK IMMEDIATE MEDICAL CARE?  °You should get help right away if: °· You have confusion or drowsiness. °· You feel sick to your stomach (nauseous) or have continued, forceful vomiting. °· You have dizziness or unsteadiness that is getting worse. °· You have severe, continued headaches not relieved by medicine. Only take over-the-counter or prescription medicines for pain, fever, or discomfort as directed by your health care provider. °· You do not have normal function of the arms or legs or are unable to walk. °· You notice changes in the black spots in the center of the colored part of your eye (pupil). °· You have a clear or   bloody fluid coming from your nose or ears. °· You have a loss of vision. °During the next 24 hours after the injury, you must stay with someone who can watch you for the warning signs. This person should contact local emergency services (911 in the [place] if you have seizures, you become unconscious, or you are unable to wake up. °HOW CAN I PREVENT A HEAD INJURY IN THE FUTURE? °The most important factor for preventing major head injuries is avoiding motor vehicle accidents.  To minimize the potential for damage to your head, it is crucial to wear seat belts while riding in motor vehicles. Wearing helmets while bike riding and playing collision sports (like football) is also helpful. Also, avoiding dangerous activities around the house will further help reduce your risk of head injury.  °WHEN CAN I RETURN TO NORMAL ACTIVITIES AND ATHLETICS? °You should be reevaluated by your health care provider before returning to these activities. If you have any of the following symptoms, you should not return to  activities or contact sports until 1 week after the symptoms have stopped: °· Persistent headache. °· Dizziness or vertigo. °· Poor attention and concentration. °· Confusion. °· Memory problems. °· Nausea or vomiting. °· Fatigue or tire easily. °· Irritability. °· Intolerant of bright lights or loud noises. °· Anxiety or depression. °· Disturbed sleep. °MAKE SURE YOU:  °· Understand these instructions. °· Will watch your condition. °· Will get help right away if you are not doing well or get worse. °Document Released: 06/14/2005 Document Revised: 06/19/2013 Document Reviewed: 02/19/2013 °ExitCare® Patient Information ©2015 [employer]. This information is not intended to replace advice given to you by your health care provider. Make sure you discuss any questions you have with your health care provider. ° °

## 2014-11-23 NOTE — ED Notes (Signed)
Pt repositioned for comfort. Pillow placed under pt bottom and warm blankets provided.

## 2014-11-23 NOTE — ED Notes (Signed)
[facility] called to take pt back to [facility] [place]. [facility] stated that it may be a while due to unavailability of trucks.

## 2014-11-23 NOTE — ED Notes (Signed)
[facility] arrived to transport pt back to facility. 

## 2014-11-23 NOTE — ED Notes (Signed)
Attempted to call report back to [facility]-no one ever answered the phone

## 2014-11-23 NOTE — ED Notes (Signed)
Pt from [facility] via [facility]-Per EMS pt fell out of his wheelchair today hitting head on floor. Pt does not take blood thinners per facility. Pt has approx 1 in head lac, bleeding controlled. Pt denies LOC or other injury. Pt is A&O and in NAD.

## 2014-11-23 NOTE — ED Provider Notes (Addendum)
CSN: [id number]     Arrival date & time 11/23/14  1308 History   First MD Initiated Contact with Patient 11/23/14 1314     Chief Complaint  Patient presents with  . Fall  . Head Laceration     (Consider location/radiation/quality/duration/timing/severity/associated sxs/prior Treatment) HPI The patient fell out of his wheelchair. He reports he struck his head. There was no loss of consciousness. The patient has a laceration above the right eye. The patient is not on any blood thinners. He denies any general headache and reports only pain right at the site of the wound. Patient denies other areas of injury or pain. He does report however he has arthritis and it gives him quite a bit of pain in multiple joints. He reports is hard for him to lie still on the bed. Review of the nursing home record indicates chronic pain and restless legs. It also indicates problems with agitation in the past. Past Medical History  Diagnosis Date  . Duodenal ulcer hemorrhage   . Hypertrophy of prostate   . Generalized muscle weakness   . Acute respiratory failure   . Cognitive communication deficit   . MRSA (methicillin resistant Staphylococcus aureus)   . Kidney stone   . Hypertension    Past Surgical History  Procedure Laterality Date  . Knee arthroscopy      right  . Total knee arthroplasty      right  . Wrist surgery      right  . Neck surgery    . Exploratory laparotomy  02/2011    Duodenotomy, oversew of gastric ulcer/duodenal artery  . Appendectomy    . Tibia fracture surgery    . Olecranon bursectomy Left 09/08/2012    Procedure: OLECRANON BURSA;  Surgeon: Velna OchsPeter G Dalldorf, MD;  Location: [facility];  Service: Orthopedics;  Laterality: Left;  LEFT  ELBOW  OLECRANON  BURSA  INCISION   Family History  Problem Relation Age of Onset  . Hypertension Father    History  Substance Use Topics  . Smoking status: Current Every Day Smoker -- 40 years    Last Attempt to Quit: 03/08/2011  . Smokeless  tobacco: Never Used  . Alcohol Use: No     Comment: states he stopped 2 yrs ago    Review of Systems 10 Systems reviewed and are negative for acute change except as noted in the HPI.   Allergies  Aspirin and Ibuprofen  Home Medications   Prior to Admission medications   Medication Sig Start Date End Date Taking? Authorizing Provider  calcium carbonate (OS-CAL) 600 MG TABS tablet Take 600 mg by mouth 2 (two) times daily.   Yes Historical Provider, MD  cholecalciferol (VITAMIN D) 1000 UNITS tablet Take 1,000 Units by mouth daily.   Yes Historical Provider, MD  docusate sodium (COLACE) 100 MG capsule Take 100 mg by mouth daily.    Yes Historical Provider, MD  gabapentin (NEURONTIN) 100 MG capsule Take 100 mg by mouth 3 (three) times daily.   Yes Historical Provider, MD  guaiFENesin (ROBITUSSIN) 100 MG/5ML SOLN Take 5 mLs by mouth every 4 (four) hours as needed for cough or to loosen phlegm.   Yes Historical Provider, MD  HYDROcodone-acetaminophen (NORCO/VICODIN) 5-325 MG per tablet Take 1-2 tablets by mouth every 6 (six) hours as needed. Patient taking differently: Take 1-2 tablets by mouth 3 (three) times daily.  11/06/13  Yes Roxy Horsemanobert Browning, PA-C  lisinopril (PRINIVIL,ZESTRIL) 10 MG tablet Take 10 mg by mouth every morning.  Yes Historical Provider, MD  loratadine (CLARITIN) 10 MG tablet Take 10 mg by mouth daily as needed for allergies.   Yes Historical Provider, MD  LORazepam (ATIVAN) 0.5 MG tablet Take 0.5 mg by mouth every 6 (six) hours as needed for anxiety.   Yes Historical Provider, MD  magnesium oxide (MAG-OX) 400 MG tablet Take 400 mg by mouth at bedtime.    Yes Historical Provider, MD  Multiple Vitamin (MULTIVITAMIN WITH MINERALS) TABS Take 2 tablets by mouth every morning.    Yes Historical Provider, MD  omeprazole (PRILOSEC) 20 MG capsule Take 20 mg by mouth every morning.    Yes Historical Provider, MD  rOPINIRole (REQUIP) 1 MG tablet Take 1 mg by mouth at bedtime and may  repeat dose one time if needed.   Yes Historical Provider, MD  senna (SENOKOT) 8.6 MG TABS Take 1 tablet by mouth at bedtime.    Yes Historical Provider, MD  sertraline (ZOLOFT) 100 MG tablet Take 100 mg by mouth at bedtime.   Yes Historical Provider, MD  traZODone (DESYREL) 25 mg TABS tablet Take 25 mg by mouth at bedtime.   Yes Historical Provider, MD  traZODone (DESYREL) 50 MG tablet Take 125 mg by mouth 2 (two) times daily. Depression/anxiety.   Yes Historical Provider, MD  mineral oil-hydrophilic petrolatum (AQUAPHOR) ointment Apply 1 application topically at bedtime. Apply to hips and both legs    Historical Provider, MD  NYSTATIN, TOPICAL, POWD Apply 1 application topically daily. Apply to scrum and coccyx    Historical Provider, MD   BP 122/84 mmHg  Pulse 80  Temp(Src) 98.2 F (36.8 C) (Oral)  Resp 18  SpO2 100% Physical Exam  Constitutional:  Patient is thin and borderline cachectic. He is nontoxic. He has no respiratory distress. He is very fidgety but cooperative.  HENT:  2 cm laceration to the right brow. No other apparent areas of contusion or hematoma.  Eyes: EOM are normal. Pupils are equal, round, and reactive to light.  Cardiovascular: Normal rate, regular rhythm, normal heart sounds and intact distal pulses.   Pulmonary/Chest: Effort normal and breath sounds normal.  Abdominal: Soft.  Musculoskeletal:  Extremities are muscular atrophic with many scars. He does appear to have degenerative joint disease. No acute deformities. Patient is moving all extremities spontaneously.  Neurological: He is alert. No cranial nerve deficit. Coordination normal.  The patient's fidgety and moving all 4 extremities.  Skin: Skin is warm and dry.  Psychiatric:  Patient is cooperative and pleasant.    ED Course  LACERATION REPAIR Date/Time: 11/23/2014 3:11 PM Performed by: Arby Barrette Authorized by: Arby Barrette Body area: head/neck Location details: right eyebrow Laceration  length: 2 cm Foreign bodies: no foreign bodies Tendon involvement: none Nerve involvement: none Vascular damage: no Anesthesia: local infiltration Local anesthetic: lidocaine 1% without epinephrine Anesthetic total: 2 ml Amount of cleaning: standard Debridement: none Degree of undermining: none Skin closure: 5-0 nylon Technique: simple Approximation: close Approximation difficulty: simple Dressing: antibiotic ointment and 4x4 sterile gauze Patient tolerance: Patient tolerated the procedure well with no immediate complications   (including critical care time) Labs Review Labs Reviewed - No data to display  Imaging Review No results found.   EKG Interpretation None      MDM   Final diagnoses:  Facial laceration, initial encounter  Head injury, initial encounter   Patient had a minor fall. At this point in time he is not on blood thinners did not have loss of consciousness and has  no complaint of general headache. He is alert and interactive. Laceration was repaired as outlined. Patient was given Ativan and Vicodin per his MAR medications. After medications patient was pleasant and cooperative and much less fidgety. His mental status remained clear.    Arby Barrette, MD 11/23/14 1515  Arby Barrette, MD 11/23/14 [phone]

## 2014-11-23 NOTE — ED Notes (Signed)
Pt resting quietly at this time

## 2014-11-23 NOTE — ED Notes (Signed)
Dr Broadus JohnPfieffer advised that suture cart and pt are ready for lac repair

## 2015-01-10 ENCOUNTER — Ambulatory Visit (INDEPENDENT_AMBULATORY_CARE_PROVIDER_SITE_OTHER): Payer: Medicaid Other | Admitting: Sports Medicine

## 2015-01-10 VITALS — BP 110/70 | HR 69

## 2015-01-10 DIAGNOSIS — L98491 Non-pressure chronic ulcer of skin of other sites limited to breakdown of skin: Secondary | ICD-10-CM | POA: Diagnosis not present

## 2015-01-10 DIAGNOSIS — M720 Palmar fascial fibromatosis [Dupuytren]: Secondary | ICD-10-CM | POA: Diagnosis not present

## 2015-01-10 DIAGNOSIS — L98A391 Non-pressure chronic ulcer of unspecified hand limited to breakdown of skin: Secondary | ICD-10-CM | POA: Insufficient documentation

## 2015-01-10 MED ORDER — CLOTRIMAZOLE-BETAMETHASONE 1-0.05 % EX CREA: 1.0000 "application " | TOPICAL_CREAM | Freq: Two times a day (BID) | CUTANEOUS | Status: DC

## 2015-01-10 MED ORDER — MELOXICAM 15 MG PO TABS: ORAL_TABLET | ORAL | Status: DC

## 2015-01-10 NOTE — Progress Notes (Signed)
  Subjective:    CC: Nursing home referral  HPI:  This is a pleasant 64 year old male with a history of multiple sclerosis, he was sent to me from his nursing home for what they suspect is Dupuytren's contracture of both hands. He is able to place both hands flat on the table, has good function of his hands and tells me he does have significant calluses from using his wheelchair. He only has minimal pain. Symptoms are mild, persistent. No radiation.  Past medical history, Surgical history, Family history not pertinant except as noted below, Social history, Allergies, and medications have been entered into the medical record, reviewed, and no changes needed.   Review of Systems: No headache, visual changes, nausea, vomiting, diarrhea, constipation, dizziness, abdominal pain, skin rash, fevers, chills, night sweats, swollen lymph nodes, weight loss, chest pain, body aches, joint swelling, muscle aches, shortness of breath, mood changes, visual or auditory hallucinations.  Objective:    General: Well Developed, well nourished, and in no acute distress.  Neuro: Alert and oriented x3, extra-ocular muscles intact, sensation grossly intact. Nonambulatory. HEENT: Normocephalic, atraumatic, pupils equal round reactive to light, neck supple, no masses, no lymphadenopathy, thyroid nonpalpable.  Skin: Warm and dry, no rashes noted.  Cardiac: Regular rate and rhythm, no murmurs rubs or gallops.  Respiratory: Clear to auscultation bilaterally. Not using accessory muscles, speaking in full sentences.  Abdominal: Soft, nontender, nondistended, positive bowel sounds, no masses, no organomegaly.  Hands: Visible calluses in a linear distribution, subcutaneous, and along the palmar aponeurosis to the third digit. Patient is able to place hand completely flat down on the table. He does have some weakness with wrist and hand extension likely from the multiple sclerosis. At the base of the left third finger, volar,  there is a split the skin that appears to go to the subcutaneous tissues. No evidence of aggressive bacterial superinfection.  Impression and Recommendations:    The patient was counselled, risk factors were discussed, anticipatory guidance given.

## 2015-01-10 NOTE — Progress Notes (Signed)
UNABLE TO GET WEIGHT, PATIENT IN WHEELCHAIR. Marian Grandt,CMA

## 2015-01-10 NOTE — Assessment & Plan Note (Signed)
Mild, patient has full function of his hands in a great deal of the overlying skin changes are due to callus from pushing his wheelchair. He is able to place his hand completely flat on the table, and some of his difficulty with finger extension is due to his multiple sclerosis. This needs some hand therapy but no specific Dupuytren's contracture treatment. Return to see me in one month.

## 2015-01-10 NOTE — Assessment & Plan Note (Signed)
Likely fungal infection, and maceration. Topical Lotrisone, he will need nursing dressing changes.

## 2015-02-07 ENCOUNTER — Ambulatory Visit (INDEPENDENT_AMBULATORY_CARE_PROVIDER_SITE_OTHER): Payer: Medicare Other | Admitting: Sports Medicine

## 2015-02-07 DIAGNOSIS — L98491 Non-pressure chronic ulcer of skin of other sites limited to breakdown of skin: Secondary | ICD-10-CM | POA: Diagnosis not present

## 2015-02-07 DIAGNOSIS — L98A391 Non-pressure chronic ulcer of unspecified hand limited to breakdown of skin: Secondary | ICD-10-CM

## 2015-02-07 DIAGNOSIS — M720 Palmar fascial fibromatosis [Dupuytren]: Secondary | ICD-10-CM

## 2015-02-07 MED ORDER — MELOXICAM 15 MG PO TABS: ORAL_TABLET | ORAL | Status: AC

## 2015-02-07 MED ORDER — CLOBETASOL PROPIONATE 0.05 % EX OINT: 1.0000 "application " | TOPICAL_OINTMENT | Freq: Two times a day (BID) | CUTANEOUS | Status: AC

## 2015-02-07 NOTE — Progress Notes (Signed)
  Subjective:    CC:  Follow-up  HPI: [name] returns, he has had ulcers and some thickening of the skin/calluses, likely from wheelchair use, he also has multiple sclerosis, he likely does not have  Dupuytren's contracture. I advised that he use a topical Lotrisone cream as well as meloxicam, and he some hand physical therapy, unfortunately he has not done any meloxicam, and he has not been able to obtain the cream, the hand ulcers have actually improved as has his pain with nurse dressing changes.  Past medical history, Surgical history, Family history not pertinant except as noted below, Social history, Allergies, and medications have been entered into the medical record, reviewed, and no changes needed.   Review of Systems: No fevers, chills, night sweats, weight loss, chest pain, or shortness of breath.   Objective:    General: Well Developed, well nourished, and in no acute distress.  Neuro: Alert and oriented x3, extra-ocular muscles intact, sensation grossly intact.  HEENT: Normocephalic, atraumatic, pupils equal round reactive to light, neck supple, no masses, no lymphadenopathy, thyroid nonpalpable.  Skin: Warm and dry, no rashes. Cardiac: Regular rate and rhythm, no murmurs rubs or gallops, no lower extremity edema.  Respiratory: Clear to auscultation bilaterally. Not using accessory muscles, speaking in full sentences. Hands: thickened calluses where he uses his wheelchair, he does have gloves, the cracking in the skin has improved significantly , and is no longer open. No longer tender to palpation. No signs of bacterial infection.  Impression and Recommendations:   I spent 25 minutes with this patient, greater than 50% was face-to-face time counseling regarding the above diagnoses

## 2015-02-07 NOTE — Assessment & Plan Note (Signed)
Improved significantly. Next line still has some pain, he has not yet gotten  Neither the meloxicam nor the topical cream. Another prescription given as well as an order for hand occupational therapy.

## 2015-02-07 NOTE — Assessment & Plan Note (Signed)
Overall doing okay. Needs to continue hand therapy.

## 2015-03-07 ENCOUNTER — Ambulatory Visit (INDEPENDENT_AMBULATORY_CARE_PROVIDER_SITE_OTHER): Payer: Medicaid Other | Admitting: Sports Medicine

## 2015-03-07 ENCOUNTER — Encounter: Payer: Self-pay | Admitting: Sports Medicine

## 2015-03-07 VITALS — BP 122/72 | HR 76 | Ht 78.0 in

## 2015-03-07 DIAGNOSIS — L98A391 Non-pressure chronic ulcer of unspecified hand limited to breakdown of skin: Secondary | ICD-10-CM

## 2015-03-07 DIAGNOSIS — L98491 Non-pressure chronic ulcer of skin of other sites limited to breakdown of skin: Secondary | ICD-10-CM

## 2015-03-07 NOTE — Progress Notes (Signed)
  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 Regarding his Dupuytren's contracture, he continues to improve with hand physical therapy and is able to lay his hand flat on the countertop. He did have several cracking ulcers at the interphalangeal joints which have all resolved with topical clobetasol. Unfortunately he did develop a new cracking ulcer at the right third PIP, has not yet started to apply the cream. Overall he is happy.   Past medical history, Surgical history, Family history not pertinant except as noted below, Social history, Allergies, and medications have been entered into the medical record, reviewed, and no changes needed.   Review of Systems: No fevers, chills, night sweats, weight loss, chest pain, or shortness of breath.   Objective:    General: Well Developed, well nourished, and in no acute distress.  Neuro: Alert and oriented x3, extra-ocular muscles intact, sensation grossly intact.  HEENT: Normocephalic, atraumatic, pupils equal round reactive to light, neck supple, no masses, no lymphadenopathy, thyroid nonpalpable.  Skin: Warm and dry, no rashes. Cardiac: Regular rate and rhythm, no murmurs rubs or gallops, no lower extremity edema.  Respiratory: Clear to auscultation bilaterally. Not using accessory muscles, speaking in full sentences. Hands: All previous ulcers have resolved, he is able to passively lay his hand flat on a countertop indicating only mild Dupuytren's contracture. There is a new ulcer on the right third PIP, palmar aspect, no signs of bacterial superinfection.  Impression and Recommendations:    I spent 25 minutes with this patient, greater than 50% was face-to-face time counseling regarding the above diagnoses

## 2015-03-07 NOTE — Assessment & Plan Note (Signed)
Resolved with topical clobetasol. New ulcer will also be treated with the same cream.

## 2015-09-27 DEATH — deceased
